# Patient Record
Sex: Male | Born: 1975 | Race: Black or African American | Hispanic: No | Smoking: Former smoker
Health system: Southern US, Community
[De-identification: ages and names within clinical notes are randomized; demographics above are authoritative.]

## PROBLEM LIST (undated history)

## (undated) DIAGNOSIS — I10 Essential (primary) hypertension: Secondary | ICD-10-CM

## (undated) DIAGNOSIS — R51 Headache: Secondary | ICD-10-CM

## (undated) DIAGNOSIS — M1990 Unspecified osteoarthritis, unspecified site: Secondary | ICD-10-CM

## (undated) DIAGNOSIS — R519 Headache, unspecified: Secondary | ICD-10-CM

## (undated) DIAGNOSIS — E669 Obesity, unspecified: Secondary | ICD-10-CM

## (undated) DIAGNOSIS — K219 Gastro-esophageal reflux disease without esophagitis: Secondary | ICD-10-CM

## (undated) DIAGNOSIS — T7840XA Allergy, unspecified, initial encounter: Secondary | ICD-10-CM

## (undated) HISTORY — DX: Obesity, unspecified: E66.9

## (undated) HISTORY — PX: ACHILLES TENDON REPAIR: SUR1153

## (undated) HISTORY — DX: Allergy, unspecified, initial encounter: T78.40XA

## (undated) HISTORY — PX: ANTERIOR CRUCIATE LIGAMENT REPAIR: SHX115

## (undated) HISTORY — DX: Essential (primary) hypertension: I10

## (undated) HISTORY — PX: TONSILLECTOMY: SUR1361

---

## 2014-08-15 ENCOUNTER — Emergency Department (HOSPITAL_BASED_OUTPATIENT_CLINIC_OR_DEPARTMENT_OTHER)
Admission: EM | Admit: 2014-08-15 | Discharge: 2014-08-15 | Disposition: A | Discharge status: Home / Self Care | Payer: Managed Care, Other (non HMO) | Attending: Emergency Medicine | Admitting: Emergency Medicine

## 2014-08-15 ENCOUNTER — Encounter (HOSPITAL_BASED_OUTPATIENT_CLINIC_OR_DEPARTMENT_OTHER): Payer: Self-pay | Admitting: *Deleted

## 2014-08-15 ENCOUNTER — Emergency Department (HOSPITAL_BASED_OUTPATIENT_CLINIC_OR_DEPARTMENT_OTHER): Payer: Managed Care, Other (non HMO)

## 2014-08-15 DIAGNOSIS — R109 Unspecified abdominal pain: Secondary | ICD-10-CM | POA: Insufficient documentation

## 2014-08-15 DIAGNOSIS — R0781 Pleurodynia: Secondary | ICD-10-CM

## 2014-08-15 DIAGNOSIS — Z720 Tobacco use: Secondary | ICD-10-CM | POA: Insufficient documentation

## 2014-08-15 LAB — URINALYSIS, ROUTINE W REFLEX MICROSCOPIC
Glucose, UA: NEGATIVE mg/dL
Hgb urine dipstick: NEGATIVE
KETONES UR: NEGATIVE mg/dL
LEUKOCYTES UA: NEGATIVE
Nitrite: NEGATIVE
PROTEIN: NEGATIVE mg/dL
Specific Gravity, Urine: 1.028 (ref 1.005–1.030)
UROBILINOGEN UA: 1 mg/dL (ref 0.0–1.0)
pH: 5.5 (ref 5.0–8.0)

## 2014-08-15 MED ORDER — OXYCODONE-ACETAMINOPHEN 5-325 MG PO TABS: 1.0000 | ORAL_TABLET | Freq: Four times a day (QID) | ORAL | Status: DC | PRN

## 2014-08-15 MED ORDER — OXYCODONE-ACETAMINOPHEN 5-325 MG PO TABS
2.0000 | ORAL_TABLET | Freq: Once | ORAL | Status: AC
  Administered 2014-08-15: 2 via ORAL
  Filled 2014-08-15: qty 2

## 2014-08-15 MED ORDER — CYCLOBENZAPRINE HCL 10 MG PO TABS: 10.0000 mg | ORAL_TABLET | Freq: Three times a day (TID) | ORAL | Status: DC | PRN

## 2014-08-15 NOTE — ED Notes (Signed)
Left flank/rib pain since Thursday-Denies cough; denies injury- pain with cough or laughing

## 2014-08-15 NOTE — ED Notes (Signed)
Urine spec obtained, to lab

## 2014-08-15 NOTE — ED Notes (Signed)
Pt complains of Right sided pain. Works in [employer] so pulling/lifting all the time. "I think I just pulled a muscle." Denies any GU/GI issues. Pain when coughing or with certain movement.

## 2014-08-15 NOTE — Discharge Instructions (Signed)
No driving or operating heavy machinery while taking flexeril. This medication may make you drowsy. Take percocet for severe pain only. No driving or operating heavy machinery while taking percocet. This medication may cause drowsiness. Avoid heavy lifting or hard physical activity for the next few days. Follow-up with one of the attached resources to establish care with a primary care physician.  Musculoskeletal Pain Musculoskeletal pain is muscle and boney aches and pains. These pains can occur in any part of the body. Your caregiver may treat you without knowing the cause of the pain. They may treat you if blood or urine tests, X-rays, and other tests were normal.  CAUSES There is often not a definite cause or reason for these pains. These pains may be caused by a type of germ (virus). The discomfort may also come from overuse. Overuse includes working out too hard when your body is not fit. Boney aches also come from weather changes. Bone is sensitive to atmospheric pressure changes. HOME CARE INSTRUCTIONS   Ask when your test results will be ready. Make sure you get your test results.  Only take over-the-counter or prescription medicines for pain, discomfort, or fever as directed by your caregiver. If you were given medications for your condition, do not drive, operate machinery or power tools, or sign legal documents for 24 hours. Do not drink alcohol. Do not take sleeping pills or other medications that may interfere with treatment.  Continue all activities unless the activities cause more pain. When the pain lessens, slowly resume normal activities. Gradually increase the intensity and duration of the activities or exercise.  During periods of severe pain, bed rest may be helpful. Lay or sit in any position that is comfortable.  Putting ice on the injured area.  Put ice in a bag.  Place a towel between your skin and the bag.  Leave the ice on for 15 to 20 minutes, 3 to 4 times a  day.  Follow up with your caregiver for continued problems and no reason can be found for the pain. If the pain becomes worse or does not go away, it may be necessary to repeat tests or do additional testing. Your caregiver may need to look further for a possible cause. SEEK IMMEDIATE MEDICAL CARE IF:  You have pain that is getting worse and is not relieved by medications.  You develop chest pain that is associated with shortness or breath, sweating, feeling sick to your stomach (nauseous), or throw up (vomit).  Your pain becomes localized to the abdomen.  You develop any new symptoms that seem different or that concern you. MAKE SURE YOU:   Understand these instructions.  Will watch your condition.  Will get help right away if you are not doing well or get worse. Document Released: 12/19/2004 Document Revised: 03/13/2011 Document Reviewed: 08/23/2012 [facility] Patient Information 2015 [place], [place]. This information is not intended to replace advice given to you by your health care provider. Make sure you discuss any questions you have with your health care provider.  Chest Wall Pain Chest wall pain is pain in or around the bones and muscles of your chest. It may take up to 6 weeks to get better. It may take longer if you must stay physically active in your work and activities.  CAUSES  Chest wall pain may happen on its own. However, it may be caused by:  A viral illness like the flu.  Injury.  Coughing.  Exercise.  Arthritis.  Fibromyalgia.  Shingles.  HOME CARE INSTRUCTIONS   Avoid overtiring physical activity. Try not to strain or perform activities that cause pain. This includes any activities using your chest or your abdominal and side muscles, especially if heavy weights are used.  Put ice on the sore area.  Put ice in a plastic bag.  Place a towel between your skin and the bag.  Leave the ice on for 15-20 minutes per hour while awake for the first 2  days.  Only take over-the-counter or prescription medicines for pain, discomfort, or fever as directed by your caregiver. SEEK IMMEDIATE MEDICAL CARE IF:   Your pain increases, or you are very uncomfortable.  You have a fever.  Your chest pain becomes worse.  You have new, unexplained symptoms.  You have nausea or vomiting.  You feel sweaty or lightheaded.  You have a cough with phlegm (sputum), or you cough up blood. MAKE SURE YOU:   Understand these instructions.  Will watch your condition.  Will get help right away if you are not doing well or get worse. Document Released: 12/19/2004 Document Revised: 03/13/2011 Document Reviewed: 08/15/2010 [facility] Patient Information 2015 [place], [place]. This information is not intended to replace advice given to you by your health care provider. Make sure you discuss any questions you have with your health care provider.

## 2014-08-15 NOTE — ED Provider Notes (Signed)
CSN: [id number]     Arrival date & time 08/15/14  1839 History   First MD Initiated Contact with Patient 08/15/14 2004     Chief Complaint  Patient presents with  . Flank Pain     (Consider location/radiation/quality/duration/timing/severity/associated sxs/prior Treatment) HPI Comments: 39 year old male presenting with left-sided flank/rib pain 3 days. No known injury or trauma but states he works in [employer] and is pulling and lifting things frequently. He believes he pulled a muscle. Pain worse with movement, laughing or deep inspiration. No alleviating factors tried. Denies fever, chills, chest pain, shortness of breath, abdominal pain, nausea, vomiting, urinary symptoms or back pain.  Patient is a 39 y.o. male presenting with flank pain. The history is provided by the patient.  Flank Pain    History reviewed. No pertinent past medical history. Past Surgical History  Procedure Laterality Date  . Anterior cruciate ligament repair    . Achilles tendon repair    . Tonsillectomy     No family history on file. Social History  Substance Use Topics  . Smoking status: Current Every Day Smoker    Types: Cigarettes  . Smokeless tobacco: Never Used  . Alcohol Use: 1.8 oz/week    3 Cans of beer per week     Comment: 3/day    Review of Systems  Genitourinary: Positive for flank pain.  All other systems reviewed and are negative.     Allergies  Review of patient's allergies indicates no known allergies.  Home Medications   Prior to Admission medications   Medication Sig Start Date End Date Taking? Authorizing Provider  cyclobenzaprine (FLEXERIL) 10 MG tablet Take 1 tablet (10 mg total) by mouth every 8 (eight) hours as needed for muscle spasms. 08/15/14   Kathrynn Speed, PA-C  oxyCODONE-acetaminophen (PERCOCET/ROXICET) 5-325 MG per tablet Take 1-2 tablets by mouth every 6 (six) hours as needed for severe pain. 08/15/14   Kirsta Probert M Wava Kildow, PA-C   BP 110/69 mmHg  Pulse 72   Temp(Src) 98.4 F (36.9 C) (Oral)  Resp 18  Ht 6' (1.829 m)  Wt 373 lb 11.2 oz (169.509 kg)  BMI 50.67 kg/m2  SpO2 100% Physical Exam  Constitutional: He is oriented to person, place, and time. He appears well-developed and well-nourished. No distress.  Morbidly obese.  HENT:  Head: Normocephalic and atraumatic.  Eyes: Conjunctivae and EOM are normal.  Neck: Normal range of motion. Neck supple.  Cardiovascular: Normal rate, regular rhythm and normal heart sounds.   Pulmonary/Chest: Effort normal and breath sounds normal.  TTP right anterolateral ribs. No edema or step-off. Exam somewhat limited by patient's body habitus.  Abdominal: Soft. Bowel sounds are normal.  Musculoskeletal: Normal range of motion. He exhibits no edema.  Neurological: He is alert and oriented to person, place, and time.  Skin: Skin is warm and dry.  Psychiatric: He has a normal mood and affect. His behavior is normal.  Nursing note and vitals reviewed.   ED Course  Procedures (including critical care time) Labs Review Labs Reviewed  URINALYSIS, ROUTINE W REFLEX MICROSCOPIC (NOT AT [facility]) - Abnormal; Notable for the following:    Bilirubin Urine SMALL (*)    All other components within normal limits    Imaging Review Dg Ribs Unilateral W/chest Left  08/15/2014   CLINICAL DATA:  Acute onset of left flank and rib pain. Initial encounter.  EXAM: LEFT RIBS AND CHEST - 3+ VIEW  COMPARISON:  None.  FINDINGS: No displaced rib fractures are seen.  The lungs are well-aerated. Pulmonary vascularity is at the upper limits of normal. There is no evidence of focal opacification, pleural effusion or pneumothorax.  The cardiomediastinal silhouette is borderline normal in size. No acute osseous abnormalities are seen.  IMPRESSION: No displaced rib fracture seen. No acute cardiopulmonary process identified.   Electronically Signed   By: Roanna Raider M.D.   On: 08/15/2014 21:13   I, Celene Skeen, personally reviewed and  evaluated these images and lab results as part of my medical decision-making.   EKG Interpretation None      MDM   Final diagnoses:  Rib pain on left side   Nontoxic appearing, NAD. AF VSS. Pain reproducible over left anterolateral ribs. No abdominal tenderness. No associated shortness of breath or chest pain. Works in [employer] and is constantly lifting things and moving things. X-ray negative for any fracture. Most likely musculoskeletal. Advised rest, ice/heat, NSAIDs along with will give Rx for Percocet and Flexeril. Resources given for PCP follow-up. Stable for discharge. Return precautions given. Patient states understanding of treatment care plan and is agreeable.  Kathrynn Speed, PA-C 08/15/14 2209  Blake Divine, MD 08/16/14 [phone]

## 2015-03-28 ENCOUNTER — Emergency Department (HOSPITAL_BASED_OUTPATIENT_CLINIC_OR_DEPARTMENT_OTHER)
Admission: EM | Admit: 2015-03-28 | Discharge: 2015-03-28 | Disposition: A | Discharge status: Home / Self Care | Payer: Managed Care, Other (non HMO) | Attending: Emergency Medicine | Admitting: Emergency Medicine

## 2015-03-28 ENCOUNTER — Encounter (HOSPITAL_BASED_OUTPATIENT_CLINIC_OR_DEPARTMENT_OTHER): Payer: Self-pay | Admitting: Emergency Medicine

## 2015-03-28 DIAGNOSIS — R200 Anesthesia of skin: Secondary | ICD-10-CM | POA: Diagnosis not present

## 2015-03-28 DIAGNOSIS — M62838 Other muscle spasm: Secondary | ICD-10-CM | POA: Diagnosis not present

## 2015-03-28 DIAGNOSIS — F17210 Nicotine dependence, cigarettes, uncomplicated: Secondary | ICD-10-CM | POA: Insufficient documentation

## 2015-03-28 DIAGNOSIS — H9319 Tinnitus, unspecified ear: Secondary | ICD-10-CM | POA: Insufficient documentation

## 2015-03-28 DIAGNOSIS — M542 Cervicalgia: Secondary | ICD-10-CM | POA: Diagnosis present

## 2015-03-28 DIAGNOSIS — H538 Other visual disturbances: Secondary | ICD-10-CM | POA: Diagnosis not present

## 2015-03-28 MED ORDER — DIAZEPAM 5 MG PO TABS: 5.0000 mg | ORAL_TABLET | Freq: Four times a day (QID) | ORAL | Status: DC | PRN

## 2015-03-28 MED ORDER — ACETAMINOPHEN 500 MG PO TABS
1000.0000 mg | ORAL_TABLET | Freq: Once | ORAL | Status: AC
  Administered 2015-03-28: 1000 mg via ORAL
  Filled 2015-03-28: qty 2

## 2015-03-28 MED ORDER — OXYCODONE HCL 5 MG PO TABS
5.0000 mg | ORAL_TABLET | Freq: Once | ORAL | Status: AC
  Administered 2015-03-28: 5 mg via ORAL
  Filled 2015-03-28: qty 1

## 2015-03-28 MED ORDER — DIAZEPAM 5 MG PO TABS
5.0000 mg | ORAL_TABLET | Freq: Once | ORAL | Status: AC
  Administered 2015-03-28: 5 mg via ORAL
  Filled 2015-03-28: qty 1

## 2015-03-28 MED ORDER — IBUPROFEN 800 MG PO TABS
800.0000 mg | ORAL_TABLET | Freq: Once | ORAL | Status: AC
  Administered 2015-03-28: 800 mg via ORAL
  Filled 2015-03-28: qty 1

## 2015-03-28 NOTE — ED Notes (Signed)
Pt with decreased field of vision to right upper lateral vision and right lateral lower vision, pt states vision in right eye seems blurred to him,

## 2015-03-28 NOTE — ED Provider Notes (Signed)
CSN: [id number]     Arrival date & time 03/28/15  1221 History   First MD Initiated Contact with Patient 03/28/15 1245     Chief Complaint  Patient presents with  . Neck Injury     (Consider location/radiation/quality/duration/timing/severity/associated sxs/prior Treatment) Patient is a 40 y.o. male presenting with neck injury. The history is provided by the patient.  Neck Injury This is a new problem. The current episode started less than 1 hour ago. The problem occurs constantly. The problem has not changed since onset.Pertinent negatives include no chest pain, no abdominal pain, no headaches and no shortness of breath. Nothing aggravates the symptoms. Nothing relieves the symptoms. He has tried nothing for the symptoms. The treatment provided no relief.   40 yo M With a chief complaint of right-sided neck pain. This happened suddenly couple days ago when he was rubbing his head. Had spontaneous pain to the right side of his neck. Pain is worse with movement or palpation. Also complaining of ringing in his ears and blurry vision in his right eye. Also has numbness to the palm of his right hand. He denies any other neuro symptoms. The symptoms are worse with worsening of the pain.  History reviewed. No pertinent past medical history. Past Surgical History  Procedure Laterality Date  . Anterior cruciate ligament repair    . Achilles tendon repair    . Tonsillectomy     History reviewed. No pertinent family history. Social History  Substance Use Topics  . Smoking status: Current Every Day Smoker    Types: Cigarettes  . Smokeless tobacco: Never Used  . Alcohol Use: 1.8 oz/week    3 Cans of beer per week     Comment: 3/day    Review of Systems  Constitutional: Negative for fever and chills.  HENT: Negative for congestion and facial swelling.   Eyes: Negative for discharge and visual disturbance.  Respiratory: Negative for shortness of breath.   Cardiovascular: Negative for chest  pain and palpitations.  Gastrointestinal: Negative for vomiting, abdominal pain and diarrhea.  Musculoskeletal: Positive for myalgias. Negative for arthralgias.  Skin: Negative for color change and rash.  Neurological: Negative for tremors, syncope and headaches.  Psychiatric/Behavioral: Negative for confusion and dysphoric mood.      Allergies  Review of patient's allergies indicates no known allergies.  Home Medications   Prior to Admission medications   Medication Sig Start Date End Date Taking? Authorizing Provider  cyclobenzaprine (FLEXERIL) 10 MG tablet Take 1 tablet (10 mg total) by mouth every 8 (eight) hours as needed for muscle spasms. 08/15/14   Robyn M Hess, PA-C  diazepam (VALIUM) 5 MG tablet Take 1 tablet (5 mg total) by mouth every 6 (six) hours as needed for muscle spasms (spasms). 03/28/15   Melene Plan, DO  oxyCODONE-acetaminophen (PERCOCET/ROXICET) 5-325 MG per tablet Take 1-2 tablets by mouth every 6 (six) hours as needed for severe pain. 08/15/14   Robyn M Hess, PA-C   BP 167/82 mmHg  Pulse 72  Temp(Src) 98.4 F (36.9 C) (Oral)  Resp 20  Ht 6' (1.829 m)  Wt 335 lb (151.955 kg)  BMI 45.42 kg/m2  SpO2 100% Physical Exam  Constitutional: He is oriented to person, place, and time. He appears well-developed and well-nourished.  HENT:  Head: Normocephalic and atraumatic.  No erythema or injection of the right eye. Pupils are 3+ reactive bilaterally. Extraocular motion intact. TMs normal.  Eyes: EOM are normal. Pupils are equal, round, and reactive to light.  Neck: Normal range of motion. Neck supple. No JVD present.  Cardiovascular: Normal rate and regular rhythm.  Exam reveals no gallop and no friction rub.   No murmur heard. Pulmonary/Chest: No respiratory distress. He has no wheezes.  Abdominal: He exhibits no distension. There is no tenderness. There is no rebound and no guarding.  Musculoskeletal: Normal range of motion. He exhibits tenderness (tenderness  palpation about the muscle belly of the trapezius that re-creates the symptoms. Pulse motor and sensation is intact distally.).  Neurological: He is alert and oriented to person, place, and time.  Skin: No rash noted. No pallor.  Psychiatric: He has a normal mood and affect. His behavior is normal.  Nursing note and vitals reviewed.   ED Course  Procedures (including critical care time) Labs Review Labs Reviewed - No data to display  Imaging Review No results found. I have personally reviewed and evaluated these images and lab results as part of my medical decision-making.   EKG Interpretation None      MDM   Final diagnoses:  Trapezius muscle spasm    40 yo M with a chief complaints of neck pain. Pain is localized into the belly of the trapezius. Pressure in that area re-creates his symptoms. Pulse motor and sensation is intact distally. Patient has no neuro deficits on my exam. Discussed with the patient that this is likely a trapezius spasm, however I'm unable to rule out a dissection of the vasculature of his neck without CT imaging. Patient is electing for conservative therapy with 3 days of rest as well as a sling. He understands the risk for stroke.   3:03 PM:  I have discussed the diagnosis/risks/treatment options with the patient and family and believe the pt to be eligible for discharge home to follow-up with PCP. We also discussed returning to the ED immediately if new or worsening sx occur. We discussed the sx which are most concerning (e.g., stroke like symptoms, fever) that necessitate immediate return. Medications administered to the patient during their visit and any new prescriptions provided to the patient are listed below.  Medications given during this visit Medications  acetaminophen (TYLENOL) tablet 1,000 mg (1,000 mg Oral Given 03/28/15 1319)  ibuprofen (ADVIL,MOTRIN) tablet 800 mg (800 mg Oral Given 03/28/15 1319)  oxyCODONE (Oxy IR/ROXICODONE) immediate  release tablet 5 mg (5 mg Oral Given 03/28/15 1319)  diazepam (VALIUM) tablet 5 mg (5 mg Oral Given 03/28/15 1320)    Discharge Medication List as of 03/28/2015  1:10 PM    START taking these medications   Details  diazepam (VALIUM) 5 MG tablet Take 1 tablet (5 mg total) by mouth every 6 (six) hours as needed for muscle spasms (spasms)., Starting 03/28/2015, Until Discontinued, Print        The patient appears reasonably screen and/or stabilized for discharge and I doubt any other medical condition or other [facility] requiring further screening, evaluation, or treatment in the ED at this time prior to discharge.     Melene Planan Heavan Francom, DO 03/28/15 1504

## 2015-03-28 NOTE — Discharge Instructions (Signed)
Take 4 over the counter ibuprofen tablets 3 times a day or 2 over-the-counter naproxen tablets twice a day for pain. ° °

## 2015-03-28 NOTE — ED Notes (Signed)
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

## 2015-03-30 ENCOUNTER — Telehealth: Payer: Self-pay

## 2015-03-30 NOTE — Telephone Encounter (Signed)
Left message for return call to update information in patients chart.

## 2015-03-30 NOTE — Telephone Encounter (Signed)
Pre Visit Call Completed. 

## 2015-03-31 ENCOUNTER — Encounter: Payer: Self-pay | Admitting: Medical

## 2015-03-31 ENCOUNTER — Encounter (HOSPITAL_BASED_OUTPATIENT_CLINIC_OR_DEPARTMENT_OTHER): Payer: Self-pay

## 2015-03-31 ENCOUNTER — Telehealth: Payer: Self-pay | Admitting: Medical

## 2015-03-31 ENCOUNTER — Ambulatory Visit (INDEPENDENT_AMBULATORY_CARE_PROVIDER_SITE_OTHER): Payer: Managed Care, Other (non HMO) | Admitting: Medical

## 2015-03-31 ENCOUNTER — Ambulatory Visit (HOSPITAL_BASED_OUTPATIENT_CLINIC_OR_DEPARTMENT_OTHER)
Admission: RE | Admit: 2015-03-31 | Discharge: 2015-03-31 | Disposition: A | Discharge status: Home / Self Care | Payer: Managed Care, Other (non HMO) | Source: Ambulatory Visit | Attending: Medical | Admitting: Medical

## 2015-03-31 VITALS — BP 124/90 | HR 71 | Temp 98.0°F | Resp 16 | Ht 71.5 in | Wt 384.4 lb

## 2015-03-31 DIAGNOSIS — R29898 Other symptoms and signs involving the musculoskeletal system: Secondary | ICD-10-CM

## 2015-03-31 DIAGNOSIS — M542 Cervicalgia: Secondary | ICD-10-CM | POA: Insufficient documentation

## 2015-03-31 DIAGNOSIS — H538 Other visual disturbances: Secondary | ICD-10-CM | POA: Diagnosis not present

## 2015-03-31 MED ORDER — IOPAMIDOL (ISOVUE-370) INJECTION 76%
100.0000 mL | Freq: Once | INTRAVENOUS | Status: AC | PRN
  Administered 2015-03-31: 100 mL via INTRAVENOUS

## 2015-03-31 MED ORDER — CYCLOBENZAPRINE HCL 10 MG PO TABS: 10.0000 mg | ORAL_TABLET | Freq: Every day | ORAL | Status: DC

## 2015-03-31 MED ORDER — HYDROCODONE-ACETAMINOPHEN 5-325 MG PO TABS: 1.0000 | ORAL_TABLET | Freq: Four times a day (QID) | ORAL | Status: DC | PRN

## 2015-03-31 MED ORDER — KETOROLAC TROMETHAMINE 60 MG/2ML IM SOLN
60.0000 mg | Freq: Once | INTRAMUSCULAR | Status: AC
  Administered 2015-03-31: 60 mg via INTRAMUSCULAR

## 2015-03-31 NOTE — Progress Notes (Signed)
Subjective:    Patient ID: Louis [name], male    DOB: [birth year], 40 y.o.   MRN: [id number]  HPI   I have reviewed pt PMH, PSH, FH, Social History and Surgical History  Pt works [employer], does some stretch and flex at work to prevent energy but no formal exercise, pt avoids fried foods and eats vegetables, married- 7 children.(2 in house)  Pt in with some neck pain since Friday morning. 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 He describes extreme neck pain 10/10. Pt went to the ED. And they evaluated him. Pt states on Sunday he had numbness of the rt hand. Some numbness to rt hand. Pt states rt hand pain got better since Sunday. Pt given valium rx. He was given 5 tablets. Pt is very tender to even light touch.  Pt on Friday morning with onset of severe pain neck pain and transient blurred vision.   Pt felt off balance Friday, Saturday and Sunday.   Pt has been off of work for 3 days.  Some sneezing and allergy flare just this past weekend. When sneezing neck pain is severe.  He states he will restart otc allergy meds.  No trauma to neck in past 2 wks. No mva. No work injury.  Pt smoke 1 pack every 2 wks.   Review of Systems  Constitutional: Negative for fever, chills and fatigue.  HENT: Positive for sneezing. Negative for congestion and dental problem.   Eyes:       Occasional rt eye blurred vision since neck pain onset. But better than on first day.  Respiratory: Negative for chest tightness, shortness of breath and wheezing.   Cardiovascular: Negative for chest pain and palpitations.  Gastrointestinal: Negative for nausea, abdominal pain and diarrhea.  Musculoskeletal: Positive for neck pain.  Skin: Negative for rash.  Neurological: Negative for dizziness and headaches.       Some rt hand feeling week. Some pain that shot down his rt arm initiall with onset of pain.  Hematological: Negative for adenopathy. Does not bruise/bleed easily.    Psychiatric/Behavioral: Negative for behavioral problems and confusion.     Past Medical History  Diagnosis Date  . Allergy     Social History   Social History  . Marital Status: Married    Spouse Name: N/A  . Number of Children: N/A  . Years of Education: N/A   Occupational History  . Not on file.   Social History Main Topics  . Smoking status: Current Every Day Smoker    Types: Cigarettes  . Smokeless tobacco: Never Used  . 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  . Drug Use: No  . Sexual Activity: Yes   Other Topics Concern  . Not on file   Social History Narrative    Past Surgical History  Procedure Laterality Date  . Anterior cruciate ligament repair    . Achilles tendon repair    . Tonsillectomy      Family History  Problem Relation Age of Onset  . Diabetes Father     No Known Allergies  Current Outpatient Prescriptions on File Prior to Visit  Medication Sig Dispense Refill  . diazepam (VALIUM) 5 MG tablet Take 1 tablet (5 mg total) by mouth every 6 (six) hours as needed for muscle spasms (spasms). 5 tablet 0  . Naproxen-Esomeprazole (VIMOVO) 500-20  MG TBEC Take by mouth.     No current facility-administered medications on file prior to visit.    BP 124/90 mmHg  Pulse 71  Temp(Src) 98 F (36.7 C) (Oral)  Resp 16  Ht 5' 11.5" (1.816 m)  Wt 384 lb 6.4 oz (174.363 kg)  BMI 52.87 kg/m2  SpO2 98%       Objective:   Physical Exam  General Mental Status- Alert. General Appearance- Not in acute distress.   Skin General: Color- Normal Color. Moisture- Normal Moisture.  Neck Carotid Arteries- Normal color. Moisture- Normal Moisture. No carotid bruits. No JVD. Posterior neck all over very tender to palpation. All over. Mid c-spine tedner.   Chest and Lung Exam Auscultation: Breath Sounds:-Normal.  Cardiovascular Auscultation:Rythm- Regular. Murmurs & Other Heart  Sounds:Auscultation of the heart reveals- No Murmurs.  Abdomen Inspection:-Inspeection Normal. Palpation/Percussion:Note:No mass. Palpation and Percussion of the abdomen reveal- Non Tender, Non Distended + BS, no rebound or guarding.   Neurologic Cranial Nerve exam:- CN III-XII intact(No nystagmus), symmetric smile.   Rt upper ext 3/5 strength flexion.Rt grip strength 3/5. Lt upper ext 5/5 and grip strength 5/5.     Assessment & Plan:  For your neck pain we gave toradol 60 mg im today.  Rx cyclobenzaprine to relax muscles use at night. Stop valium  Contiue vimovo.   Rx norco for pain. Not use while driving or operating heavy machinery. Side effects reviewed  Work excuse until Wed next week. Need to evaluate you on Tuesday  If you have worse or changing signs or symptoms then ED evaluation.  We are trying to get ct of your neck stat today. Wait in radiology until staff notifies you if authorizationgoes through.  Discussed with radiologist today on test to order.

## 2015-03-31 NOTE — Telephone Encounter (Signed)
I did advise pt on his ct of neck findings. His pain did subside some with toradol. He will folllow up on on Tuesday.

## 2015-03-31 NOTE — Patient Instructions (Addendum)
For your neck pain we gave toradol 60 mg im today.  Rx cyclobenzaprine to relax muscles use at night. Stop valium  Contiue vimovo.   Rx norco for pain. Not use while driving or operating heavy machinery. Side effects reviewed  Work excuse until Wed  next week. Need to evaluate you on Tuesday  If you have worse or changing signs or symptoms then ED evaluation.  We are trying to get ct of your neck stat today. Wait in radiology  until staff notifies you if authorization goes through.

## 2015-03-31 NOTE — Progress Notes (Signed)
Pre visit review using our clinic review tool, if applicable. No additional management support is needed unless otherwise documented below in the visit note. 

## 2015-04-06 ENCOUNTER — Ambulatory Visit (INDEPENDENT_AMBULATORY_CARE_PROVIDER_SITE_OTHER): Payer: Managed Care, Other (non HMO) | Admitting: Medical

## 2015-04-06 ENCOUNTER — Encounter: Payer: Self-pay | Admitting: Medical

## 2015-04-06 VITALS — BP 124/82 | HR 78 | Temp 97.4°F | Ht 71.5 in | Wt 384.0 lb

## 2015-04-06 DIAGNOSIS — M542 Cervicalgia: Secondary | ICD-10-CM | POA: Diagnosis not present

## 2015-04-06 DIAGNOSIS — R29898 Other symptoms and signs involving the musculoskeletal system: Secondary | ICD-10-CM

## 2015-04-06 NOTE — Progress Notes (Signed)
Subjective:    Patient ID: Louis [name], male    DOB: 04-10-1975, 40 y.o.   MRN: [id number]  HPI  Pt states he is no longer in severe pain. He does note that feels or hears crepitus type sound at times. When he sneezes or cough feels  Faint pressure in neck and upper back. But he is not feeling any radicular pain anymore. No pain radiating to rt arm. Sitting today no pain. Pt was in some pain yesterday. Pt states he doe not like to take medicine and he has strong threshold pain.    Review of Systems  Constitutional: Negative for fever, chills and fatigue.  Respiratory: Negative for cough, choking, shortness of breath and wheezing.   Cardiovascular: Negative for chest pain and palpitations.  Gastrointestinal: Negative for abdominal pain.  Musculoskeletal: Positive for neck pain. Negative for myalgias, back pain and neck stiffness.  Skin: Negative for rash.  Neurological: Negative for dizziness, tremors, speech difficulty, numbness and headaches.  Hematological: Negative for adenopathy. Does not bruise/bleed easily.  Psychiatric/Behavioral: Negative for behavioral problems and confusion.    Past Medical History  Diagnosis Date  . Allergy     Social History   Social History  . Marital Status: Married    Spouse Name: N/A  . Number of Children: N/A  . Years of Education: N/A   Occupational History  . Not on file.   Social History Main Topics  . Smoking status: Current Every Day Smoker    Types: Cigarettes  . Smokeless tobacco: Never Used  . 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  . Drug Use: No  . Sexual Activity: Yes   Other Topics Concern  . Not on file   Social History Narrative    Past Surgical History  Procedure Laterality Date  . Anterior cruciate ligament repair    . Achilles tendon repair    . Tonsillectomy      Family History  Problem Relation Age of Onset  . Diabetes Father     No  Known Allergies  Current Outpatient Prescriptions on File Prior to Visit  Medication Sig Dispense Refill  . cyclobenzaprine (FLEXERIL) 10 MG tablet Take 1 tablet (10 mg total) by mouth at bedtime. 14 tablet 0  . HYDROcodone-acetaminophen (NORCO) 5-325 MG tablet Take 1 tablet by mouth every 6 (six) hours as needed for moderate pain. 30 tablet 0  . Naproxen-Esomeprazole (VIMOVO) 500-20 MG TBEC Take by mouth.     No current facility-administered medications on file prior to visit.    BP 124/82 mmHg  Pulse 78  Temp(Src) 97.4 F (36.3 C) (Oral)  Ht 5' 11.5" (1.816 m)  Wt 384 lb (174.181 kg)  BMI 52.82 kg/m2  SpO2 98%    Past Medical History  Diagnosis Date  . Allergy     Social History   Social History  . Marital Status: Married    Spouse Name: N/A  . Number of Children: N/A  . Years of Education: N/A   Occupational History  . Not on file.   Social History Main Topics  . Smoking status: Current Every Day Smoker    Types: Cigarettes  . Smokeless tobacco: Never Used  . 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  . Drug Use: No  .  Sexual Activity: Yes   Other Topics Concern  . Not on file   Social History Narrative    Past Surgical History  Procedure Laterality Date  . Anterior cruciate ligament repair    . Achilles tendon repair    . Tonsillectomy      Family History  Problem Relation Age of Onset  . Diabetes Father     No Known Allergies  Current Outpatient Prescriptions on File Prior to Visit  Medication Sig Dispense Refill  . cyclobenzaprine (FLEXERIL) 10 MG tablet Take 1 tablet (10 mg total) by mouth at bedtime. 14 tablet 0  . HYDROcodone-acetaminophen (NORCO) 5-325 MG tablet Take 1 tablet by mouth every 6 (six) hours as needed for moderate pain. 30 tablet 0  . Naproxen-Esomeprazole (VIMOVO) 500-20 MG TBEC Take by mouth.     No current facility-administered medications on file prior to  visit.    BP 124/82 mmHg  Pulse 78  Temp(Src) 97.4 F (36.3 C) (Oral)  Ht 5' 11.5" (1.816 m)  Wt 384 lb (174.181 kg)  BMI 52.82 kg/m2  SpO2 98%        Objective:   Physical Exam  Physical Exam  General Mental Status- Alert. General Appearance- Not in acute distress. No pain sitting today on exam.  Skin General: Color- Normal Color. Moisture- Normal Moisture.  Neck Carotid Arteries- Normal color. Moisture- Normal Moisture. No carotid bruits. No JVD. Rt side neck faint trapezius tender. No mid spine tender.  Chest and Lung Exam Auscultation: Breath Sounds:-Normal.  Cardiovascular Auscultation:Rythm- Regular. Murmurs & Other Heart Sounds:Auscultation of the heart reveals- No Murmurs.  Abdomen Inspection:-Inspeection Normal. Palpation/Percussion:Note:No mass. Palpation and Percussion of the abdomen reveal- Non Tender, Non Distended + BS, no rebound or guarding.   Neurologic Cranial Nerve exam:- CN III-XII intact(No nystagmus), symmetric smile.  Bilateral upper ext- 5/5 upper ext strength symmetric      Assessment & Plan:  You are much better now. I would do light work lifting max 5-10 pounds at work for about 2 wks. Then advance to regular duty when you feel no pain at all. If you do have recurrent  radicular pain down either arm or any weakness let us know. Based on your recent history would add that point order mri of the cspine.  Continue vimovo, flexeril and norco if needed.  Follow up in 2 weeks or as needed.

## 2015-04-06 NOTE — Progress Notes (Signed)
Pre visit review using our clinic review tool, if applicable. No additional management support is needed unless otherwise documented below in the visit note. 

## 2015-04-06 NOTE — Patient Instructions (Addendum)
You are much better now. I would do light work lifting max 5-10 pounds at work for about 2 wks. Then advance to regular duty when you feel no pain at all. If you do have recurrent  radicular pain down either arm or any weakness let us know. Based on your recent history would add that point order mri of the cspine.  Continue vimovo, flexeril and norco if needed.  Follow up in 2 weeks or as needed.

## 2015-04-30 ENCOUNTER — Ambulatory Visit (INDEPENDENT_AMBULATORY_CARE_PROVIDER_SITE_OTHER): Payer: Managed Care, Other (non HMO) | Admitting: Medical

## 2015-04-30 ENCOUNTER — Encounter: Payer: Self-pay | Admitting: Medical

## 2015-04-30 ENCOUNTER — Ambulatory Visit (HOSPITAL_BASED_OUTPATIENT_CLINIC_OR_DEPARTMENT_OTHER)
Admission: RE | Admit: 2015-04-30 | Discharge: 2015-04-30 | Disposition: A | Discharge status: Home / Self Care | Payer: Managed Care, Other (non HMO) | Source: Ambulatory Visit | Attending: Medical | Admitting: Medical

## 2015-04-30 VITALS — BP 120/80 | HR 87 | Temp 98.1°F | Ht 72.0 in | Wt 381.4 lb

## 2015-04-30 DIAGNOSIS — M542 Cervicalgia: Secondary | ICD-10-CM | POA: Diagnosis not present

## 2015-04-30 DIAGNOSIS — M79622 Pain in left upper arm: Secondary | ICD-10-CM

## 2015-04-30 DIAGNOSIS — M545 Low back pain, unspecified: Secondary | ICD-10-CM

## 2015-04-30 DIAGNOSIS — M25522 Pain in left elbow: Secondary | ICD-10-CM

## 2015-04-30 DIAGNOSIS — M47896 Other spondylosis, lumbar region: Secondary | ICD-10-CM | POA: Diagnosis not present

## 2015-04-30 DIAGNOSIS — M792 Neuralgia and neuritis, unspecified: Secondary | ICD-10-CM | POA: Diagnosis not present

## 2015-04-30 DIAGNOSIS — M47892 Other spondylosis, cervical region: Secondary | ICD-10-CM | POA: Insufficient documentation

## 2015-04-30 MED ORDER — DICLOFENAC SODIUM 75 MG PO TBEC: 75.0000 mg | DELAYED_RELEASE_TABLET | Freq: Two times a day (BID) | ORAL | Status: DC

## 2015-04-30 MED ORDER — OMEPRAZOLE 20 MG PO CPDR: 20.0000 mg | DELAYED_RELEASE_CAPSULE | Freq: Every day | ORAL | Status: DC

## 2015-04-30 MED ORDER — CYCLOBENZAPRINE HCL 10 MG PO TABS: 10.0000 mg | ORAL_TABLET | Freq: Three times a day (TID) | ORAL | Status: DC | PRN

## 2015-04-30 NOTE — Patient Instructions (Signed)
For your neck and back pain will get cpsine and lumbar xray. Would you get specifics on the metal used for achilles repair. Model number/manufacturer specifics. We may need to do mri near future and would like to give info to radiology if have to order mri.  Rx diclofenac for pain. Flexeril to relax muscles after work.  Get left upper ext US today. MA will advise you on the time.   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 Let us know if you begin to associate sensation with lower back pain. Also recommend if you can not wear 60 lb work belt this may help some. Also weight loss may help.  Follow up in 2 wks or as needed

## 2015-04-30 NOTE — Progress Notes (Signed)
Pre visit review using our clinic review tool, if applicable. No additional management support is needed unless otherwise documented below in the visit note. 

## 2015-04-30 NOTE — Progress Notes (Signed)
Subjective:    Patient ID: Louis [name], male    DOB: 1975/05/11, 40 y.o.   MRN: [id number]  HPI  Pt in states he is doing ok. He has been taking it easy at work. But occasionally states if he takes a step about 6 inches will feel pain. Like compression type pain from neck to mid lspine area. Wife states that after work he appears uncomfortable in pain. Pt does not report any radiating pain to arms.   Lt arm cramped on Tuesday briefly but none since.  Medial aspect pain. No cramping pain now.   Left thigh lateral aspect occasional burning. Rare and transient pain. About 3 times a week over a month. Pain in left thigh not associated with lumbar pain.  Review of Systems  Constitutional: Negative for fever, chills and fatigue.  Respiratory: Negative for cough, chest tightness, shortness of breath and wheezing.   Cardiovascular: Negative for chest pain and palpitations.  Gastrointestinal: Negative for abdominal pain.  Musculoskeletal: Positive for back pain and neck pain.       Left thigh nerve type pain.   Skin: Negative for rash.  Neurological: Negative for dizziness and headaches.  Hematological: Negative for adenopathy. Does not bruise/bleed easily.  Psychiatric/Behavioral: Negative for behavioral problems and confusion.    Past Medical History  Diagnosis Date  . Allergy      Social History   Social History  . Marital Status: Married    Spouse Name: N/A  . Number of Children: N/A  . Years of Education: N/A   Occupational History  . Not on file.   Social History Main Topics  . Smoking status: Current Every Day Smoker    Types: Cigarettes  . Smokeless tobacco: Never Used  . 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  . Drug Use: No  . Sexual Activity: Yes   Other Topics Concern  . Not on file   Social History Narrative    Past Surgical History  Procedure Laterality Date  . Anterior cruciate ligament  repair    . Achilles tendon repair    . Tonsillectomy      Family History  Problem Relation Age of Onset  . Diabetes Father     No Known Allergies  Current Outpatient Prescriptions on File Prior to Visit  Medication Sig Dispense Refill  . cyclobenzaprine (FLEXERIL) 10 MG tablet Take 1 tablet (10 mg total) by mouth at bedtime. 14 tablet 0  . HYDROcodone-acetaminophen (NORCO) 5-325 MG tablet Take 1 tablet by mouth every 6 (six) hours as needed for moderate pain. 30 tablet 0  . Naproxen-Esomeprazole (VIMOVO) 500-20 MG TBEC Take by mouth.     No current facility-administered medications on file prior to visit.    BP 120/80 mmHg  Pulse 87  Temp(Src) 98.1 F (36.7 C) (Oral)  Ht 6' (1.829 m)  Wt 381 lb 6.4 oz (173.002 kg)  BMI 51.72 kg/m2       Objective:   Physical Exam  General Mental Status- Alert. General Appearance- Not in acute distress.   Skin General: Color- Normal Color. Moisture- Normal Moisture.  Neck Carotid Arteries- Normal color. Moisture- Normal Moisture. No carotid bruits. No JVD. When press lower cspine area feels pain in lumbar area.  Chest and Lung Exam Auscultation: Breath Sounds:-Normal.  Cardiovascular Auscultation:Rythm- Regular. Murmurs & Other Heart Sounds:Auscultation of the heart reveals- No Murmurs.  Abdomen  Inspection:-Inspeection Normal. Palpation/Percussion:Note:No mass. Palpation and Percussion of the abdomen reveal- Non Tender, Non Distended + BS, no rebound or guarding.    Neurologic Cranial Nerve exam:- CN III-XII intact(No nystagmus), symmetric smile. Strength:- 5/5 equal and symmetric strength both upper and lower extremities.   Back Mid lumbar spine tenderness to palpation. No pain on straight leg lift. No pain  on lateral movements and flexion/extension of the spine.  Lower ext neurologic  L5-S1 sensation intact bilaterally. Normal patellar reflexes bilaterally. No foot drop bilaterally.      Assessment &  Plan:  For your neck and back pain will get cpsine and lumbar xray. Would you get specifics on the metal used for achilles repair. Model number/manufacturer specifics. We may need to do mri near future and would like to give info to radiology if have to order mri.  Rx diclofenac for pain. Flexeril to relax muscles after work.  Get left upper ext US today. MA will advise you on the time.   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 Let us know if you begin to associate sensation with lower back pain. Also recommend if you can not wear 60 lb work belt this may help some. Also weight loss may help.  Follow up in 2 wks or as needed  Kin Galbraith, Ramon DredgeEdward, VF CorporationPA-C

## 2015-05-02 ENCOUNTER — Telehealth: Payer: Self-pay | Admitting: Medical

## 2015-05-02 DIAGNOSIS — M542 Cervicalgia: Secondary | ICD-10-CM

## 2015-05-02 NOTE — Telephone Encounter (Signed)
Referral to orthopedic/spine specialist put in. Let me know if they accept referral.

## 2015-05-14 ENCOUNTER — Encounter: Payer: Self-pay | Admitting: Medical

## 2015-05-14 ENCOUNTER — Ambulatory Visit (INDEPENDENT_AMBULATORY_CARE_PROVIDER_SITE_OTHER): Payer: Managed Care, Other (non HMO) | Admitting: Medical

## 2015-05-14 ENCOUNTER — Ambulatory Visit (HOSPITAL_BASED_OUTPATIENT_CLINIC_OR_DEPARTMENT_OTHER)
Admission: RE | Admit: 2015-05-14 | Discharge: 2015-05-14 | Disposition: A | Discharge status: Home / Self Care | Payer: Managed Care, Other (non HMO) | Source: Ambulatory Visit | Attending: Medical | Admitting: Medical

## 2015-05-14 ENCOUNTER — Other Ambulatory Visit: Payer: Self-pay | Admitting: Medical

## 2015-05-14 VITALS — BP 120/80 | HR 75 | Temp 98.0°F | Ht 71.5 in | Wt 386.4 lb

## 2015-05-14 DIAGNOSIS — M25552 Pain in left hip: Secondary | ICD-10-CM

## 2015-05-14 DIAGNOSIS — M542 Cervicalgia: Secondary | ICD-10-CM | POA: Diagnosis not present

## 2015-05-14 NOTE — Progress Notes (Signed)
Subjective:    Patient ID: Louis RiggersAvery [name], male    DOB: [birth year], 40 y.o.   MRN: [id number]  HPI   Pt in stating his level of neck pain is about the same. No new features.  Pt is going to see spine specialist today at 11:45 am.  Pt did have cervical spine xray done last visit.  He did not find out specifics of metal used in achilles tendon repair.   Pt states his previous left  left thigh mild pain is less. Mild burning pain on and off(some better since last visit). Pt is trying not wear his 60 lb belt. 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 No  Xray done on that visit date.    Review of Systems  Constitutional: Negative for fever, chills and fatigue.  Respiratory: Negative for cough, chest tightness, shortness of breath and wheezing.   Cardiovascular: Negative for chest pain and palpitations.  Musculoskeletal: Positive for neck pain.       Lt thigh pain lateral aspect burning pain at time. No skin rash.  Neurological: Negative for dizziness and headaches.  Hematological: Negative for adenopathy. Does not bruise/bleed easily.  Psychiatric/Behavioral: Negative for behavioral problems and confusion.    Past Medical History  Diagnosis Date  . Allergy      Social History   Social History  . Marital Status: Married    Spouse Name: N/A  . Number of Children: N/A  . Years of Education: N/A   Occupational History  . Not on file.   Social History Main Topics  . Smoking status: Current Every Day Smoker    Types: Cigarettes  . Smokeless tobacco: Never Used  . 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  . Drug Use: No  . Sexual Activity: Yes   Other Topics Concern  . Not on file   Social History Narrative    Past Surgical History  Procedure Laterality Date  . Anterior cruciate ligament repair    . Achilles tendon repair    . Tonsillectomy      Family History  Problem  Relation Age of Onset  . Diabetes Father     No Known Allergies  Current Outpatient Prescriptions on File Prior to Visit  Medication Sig Dispense Refill  . cyclobenzaprine (FLEXERIL) 10 MG tablet Take 1 tablet (10 mg total) by mouth 3 (three) times daily as needed for muscle spasms. 30 tablet 0  . diclofenac (VOLTAREN) 75 MG EC tablet Take 1 tablet (75 mg total) by mouth 2 (two) times daily. 30 tablet 0  . omeprazole (PRILOSEC) 20 MG capsule Take 1 capsule (20 mg total) by mouth daily. 30 capsule 3   No current facility-administered medications on file prior to visit.    BP 120/80 mmHg  Pulse 75  Temp(Src) 98 F (36.7 C) (Oral)  Ht 5' 11.5" (1.816 m)  Wt 386 lb 6.4 oz (175.27 kg)  BMI 53.15 kg/m2  SpO2 97%       Objective:   Physical Exam   General Mental Status- Alert. General Appearance- Not in acute distress.   Skin General: Color- Normal Color. Moisture- Normal Moisture.  Neck Carotid Arteries- Normal color. Moisture- Normal Moisture. No carotid bruits. No JVD. When press lower cspine area feels pain in lumbar area.  Chest and Lung Exam Auscultation: Breath Sounds:-Normal.  Cardiovascular Auscultation:Rythm- Regular. Murmurs & Other  Heart Sounds:Auscultation of the heart reveals- No Murmurs.  Abdomen Inspection:-Inspeection Normal. Palpation/Percussion:Note:No mass. Palpation and Percussion of the abdomen reveal- Non Tender, Non Distended + BS, no rebound or guarding.  Neurologic Cranial Nerve exam:- CN III-XII intact(No nystagmus), symmetric smile. Strength:- 5/5 equal and symmetric strength both upper and lower extremities.   Back Mid lumbar spine tenderness to palpation. No pain on straight leg lift. No pain on lateral movements and flexion/extension of the spine.  Lower ext neurologic  L5-S1 sensation intact bilaterally. Normal patellar reflexes bilaterally. No foot drop bilaterally.  Lt thigh- mild lateral aspect tender and mid thigh.(pt  states no hip pain)       Assessment & Plan:  You will attend spine specialist appointment and get opinion from them regarding neck pain. They may order mri.  For left thigh discomfort will get xray of femur today. 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  Continue to use diclofenac and flexeril.  Follow date to be determined depending on what specialist finds.  Andru Genter, Ramon Dredge, PA-C

## 2015-05-14 NOTE — Progress Notes (Signed)
Pre visit review using our clinic review tool, if applicable. No additional management support is needed unless otherwise documented below in the visit note. 

## 2015-05-14 NOTE — Patient Instructions (Signed)
You will attend spine specialist appointment and get opinion from them regarding neck pain. They may order mri.  For left thigh discomfort will get xray of femur today. 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  Continue to use diclofenac and flexeril.  Follow date to be determined depending on what specialist finds.

## 2015-05-20 ENCOUNTER — Other Ambulatory Visit: Payer: Self-pay | Admitting: Medical

## 2015-06-18 ENCOUNTER — Other Ambulatory Visit: Payer: Self-pay | Admitting: Medical

## 2015-07-14 ENCOUNTER — Other Ambulatory Visit: Payer: Self-pay

## 2015-07-14 MED ORDER — CYCLOBENZAPRINE HCL 10 MG PO TABS: 10.0000 mg | ORAL_TABLET | Freq: Three times a day (TID) | ORAL | Status: DC | PRN

## 2015-07-14 MED ORDER — DICLOFENAC SODIUM 75 MG PO TBEC: DELAYED_RELEASE_TABLET | ORAL | Status: DC

## 2015-07-14 NOTE — Telephone Encounter (Signed)
Rx filled for Diclofenac 75 mg and Flexeril 10 mg and sent to [facility] on [address]. I also informed the pt that they would be ready late this evening or tomorrow morning. Pt did not have any further questions.

## 2015-09-09 ENCOUNTER — Other Ambulatory Visit: Payer: Self-pay | Admitting: Medical

## 2015-09-09 NOTE — Telephone Encounter (Signed)
Last seen 05/14/15 and filled 07/14/15  Flexeril #90 with 1 Diclofenac #60 with 1   Please advise    KP

## 2015-09-09 NOTE — Telephone Encounter (Signed)
Called pharmacy and there is no rx at flexeril 90 tabs to be filled. She confirmed that.

## 2015-09-09 NOTE — Telephone Encounter (Signed)
Please call pt and ask him to follow up some time next week to see how his neck is. Have not seen him for some time.

## 2015-09-09 NOTE — Telephone Encounter (Signed)
I refilled pt limited number of flexeril today and diclofenac.   I saw refill request stated 90 tabs flexeril tid. With one refill.   I never write it like this. . This is not on our refill protocol policy.(muscle relaxants).   I thinks they should not be. I am very cautious with muscle relaxant prescriptions. Most of time just advise use at night.   Will you look into this.

## 2015-09-21 NOTE — Telephone Encounter (Signed)
Left message for patient to return call regarding appointment to follow up on neck.

## 2015-10-28 ENCOUNTER — Ambulatory Visit (INDEPENDENT_AMBULATORY_CARE_PROVIDER_SITE_OTHER): Payer: Managed Care, Other (non HMO) | Admitting: Medical

## 2015-10-28 ENCOUNTER — Other Ambulatory Visit: Payer: Self-pay

## 2015-10-28 ENCOUNTER — Encounter: Payer: Self-pay | Admitting: Medical

## 2015-10-28 VITALS — BP 130/80 | HR 88 | Temp 98.3°F | Ht 72.0 in | Wt 387.8 lb

## 2015-10-28 DIAGNOSIS — M542 Cervicalgia: Secondary | ICD-10-CM | POA: Diagnosis not present

## 2015-10-28 DIAGNOSIS — M5410 Radiculopathy, site unspecified: Secondary | ICD-10-CM

## 2015-10-28 MED ORDER — OMEPRAZOLE 20 MG PO CPDR: 20.0000 mg | DELAYED_RELEASE_CAPSULE | Freq: Every day | ORAL | 3 refills | Status: DC

## 2015-10-28 MED ORDER — DICLOFENAC SODIUM 75 MG PO TBEC: 75.0000 mg | DELAYED_RELEASE_TABLET | Freq: Two times a day (BID) | ORAL | 0 refills | Status: DC

## 2015-10-28 MED ORDER — TRAMADOL HCL 50 MG PO TABS: 50.0000 mg | ORAL_TABLET | Freq: Two times a day (BID) | ORAL | 0 refills | Status: DC

## 2015-10-28 MED ORDER — CYCLOBENZAPRINE HCL 10 MG PO TABS: ORAL_TABLET | ORAL | 0 refills | Status: DC

## 2015-10-28 MED ORDER — CYCLOBENZAPRINE HCL 10 MG PO TABS: 10.0000 mg | ORAL_TABLET | Freq: Every day | ORAL | 0 refills | Status: DC

## 2015-10-28 NOTE — Progress Notes (Signed)
Pre visit review using our clinic tool,if applicable. No additional management support is needed unless otherwise documented below in the visit note.  

## 2015-10-28 NOTE — Progress Notes (Signed)
Subjective:    Patient ID: Louis [name], male    DOB: 03-24-1975, 40 y.o.   MRN: [id number]  HPI  Pt in states still has  neck pain. He states he went to spine specialist appointmenst. Pt states first MD stated he needed surgery. Pt was hesitant to do surgery. Pt wanted a second opinion. Pt states after work up 2nd doctor told him he needed fusion of c1-c2.   Pt very hesitant to go through with the surgery.   Pt states he was hesitant due to reported limited range of motion will be result of surgery. Pt state specialist told him his neck looked like he had a fracture years ago. Pt states that his c3-c4 vertebrae already looked fused.   Pt still having numbness and tingling of his hands. Mostly on his left hand.  Pt states pt has moderate-severe pain at night.   Pt has been out of his tramadol, flexeril, and diclofenac. He does not drive on med. He also does not operate heavy machinery while med is in his system.  Note I reviewed mri report and appears pt may have misunderstood specialist description of his problem.   Review of Systems  Constitutional: Negative for chills, fatigue and fever.  Respiratory: Negative for cough, choking, shortness of breath and wheezing.   Cardiovascular: Negative for chest pain and palpitations.  Gastrointestinal: Negative for abdominal distention, abdominal pain, blood in stool and constipation.  Musculoskeletal: Positive for neck pain.  Skin: Negative for rash.  Neurological:       Some radicular pain to his arms.   Hematological: Negative for adenopathy. Does not bruise/bleed easily.  Psychiatric/Behavioral: Negative for behavioral problems and confusion.    Past Medical History:  Diagnosis Date  . Allergy      Social History   Social History  . Marital status: Married    Spouse name: N/A  . Number of children: N/A  . Years of education: N/A   Occupational History  . Not on file.   Social History Main Topics  . Smoking status:  Current Every Day Smoker    Types: Cigarettes  . Smokeless tobacco: Never Used  . 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  . Drug use: No  . Sexual activity: Yes   Other Topics Concern  . Not on file   Social History Narrative  . No narrative on file    Past Surgical History:  Procedure Laterality Date  . ACHILLES TENDON REPAIR    . ANTERIOR CRUCIATE LIGAMENT REPAIR    . TONSILLECTOMY      Family History  Problem Relation Age of Onset  . Diabetes Father     No Known Allergies  No current outpatient prescriptions on file prior to visit.   No current facility-administered medications on file prior to visit.     BP 121/70   Pulse 88   Temp 98.3 F (36.8 C) (Oral)   Ht 6' (1.829 m)   Wt (!) 387 lb 12.8 oz (175.9 kg)   SpO2 97%   BMI 52.60 kg/m       Objective:   Physical Exam  General Mental Status- Alert. General Appearance- Not in acute distress.   Skin General: Color- Normal Color. Moisture- Normal Moisture.  Neck Carotid Arteries- Normal color. Moisture- Normal Moisture. No carotid bruits. No JVD. Mild-moderate tenderness to palpation mid c spine.  Chest and Lung Exam Auscultation: Breath Sounds:-Normal.  Cardiovascular Auscultation:Rythm- Regular. Murmurs & Other Heart Sounds:Auscultation of the heart reveals- No Murmurs.   Neurologic Cranial Nerve exam:- CN III-XII intact(No nystagmus), symmetric smile. Strength:- 5/5 equal and symmetric strength both upper and lower extremities.      Assessment & Plan:  For your neck pain and radicular pain will refill your tramadol, flexeril and diclofenac.  I want you to sign release form and I want to get office noted from your both specialist. Then after review may get 3rd opinion.  Pt will sign contract for controlled meds and give UDS today.  Follow up in 2 weeks or as needed  Note did review mri of patient today. Tried to explain  the finding to him to give him better understanding.   Arianna Haydon, Ramon Dredge, PA-C

## 2015-10-28 NOTE — Patient Instructions (Addendum)
For your neck pain and radicular pain will refill your tramadol, flexeril and diclofenac.  I want you to sign release form and I want to get office noted from your both specialist. Then after review may get 3rd opinion.  Pt will sign contract for controlled meds and give UDS today.  Follow up in 2 weeks or as needed

## 2015-11-15 ENCOUNTER — Encounter: Payer: Self-pay | Admitting: Medical

## 2015-11-15 ENCOUNTER — Telehealth: Payer: Self-pay | Admitting: Medical

## 2015-11-15 ENCOUNTER — Ambulatory Visit (INDEPENDENT_AMBULATORY_CARE_PROVIDER_SITE_OTHER): Payer: Managed Care, Other (non HMO) | Admitting: Medical

## 2015-11-15 VITALS — BP 128/84 | HR 78 | Temp 98.1°F | Ht 72.0 in | Wt 393.0 lb

## 2015-11-15 DIAGNOSIS — Z0000 Encounter for general adult medical examination without abnormal findings: Secondary | ICD-10-CM

## 2015-11-15 DIAGNOSIS — M542 Cervicalgia: Secondary | ICD-10-CM

## 2015-11-15 DIAGNOSIS — Z113 Encounter for screening for infections with a predominantly sexual mode of transmission: Secondary | ICD-10-CM

## 2015-11-15 DIAGNOSIS — M5410 Radiculopathy, site unspecified: Secondary | ICD-10-CM

## 2015-11-15 LAB — COMPLETE METABOLIC PANEL WITH GFR
ALT: 32 U/L (ref 9–46)
AST: 27 U/L (ref 10–40)
Albumin: 4 g/dL (ref 3.6–5.1)
Alkaline Phosphatase: 74 U/L (ref 40–115)
BUN: 7 mg/dL (ref 7–25)
CALCIUM: 9.1 mg/dL (ref 8.6–10.3)
CHLORIDE: 103 mmol/L (ref 98–110)
CO2: 32 mmol/L — AB (ref 20–31)
Creat: 0.94 mg/dL (ref 0.60–1.35)
GFR, Est Non African American: >89 mL/min (ref >=60)
Glucose, Bld: 105 mg/dL — ABNORMAL HIGH (ref 65–99)
POTASSIUM: 4 mmol/L (ref 3.5–5.3)
Sodium: 139 mmol/L (ref 135–146)
Total Bilirubin: 0.3 mg/dL (ref 0.2–1.2)
Total Protein: 7.4 g/dL (ref 6.1–8.1)

## 2015-11-15 LAB — CBC WITH DIFFERENTIAL/PLATELET
BASOS ABS: 0 10*3/uL (ref 0.0–0.1)
BASOS PCT: 0.2 % (ref 0.0–3.0)
EOS ABS: 0.1 10*3/uL (ref 0.0–0.7)
Eosinophils Relative: 1.2 % (ref 0.0–5.0)
HEMATOCRIT: 42.4 % (ref 39.0–52.0)
Hemoglobin: 14 g/dL (ref 13.0–17.0)
LYMPHS ABS: 2.1 10*3/uL (ref 0.7–4.0)
LYMPHS PCT: 24.1 % (ref 12.0–46.0)
MCHC: 33.1 g/dL (ref 30.0–36.0)
MCV: 87.3 fl (ref 78.0–100.0)
MONOS PCT: 4.5 % (ref 3.0–12.0)
Monocytes Absolute: 0.4 10*3/uL (ref 0.1–1.0)
NEUTROS ABS: 6 10*3/uL (ref 1.4–7.7)
NEUTROS PCT: 70 % (ref 43.0–77.0)
PLATELETS: 266 10*3/uL (ref 150.0–400.0)
RBC: 4.85 Mil/uL (ref 4.22–5.81)
RDW: 13.8 % (ref 11.5–15.5)
WBC: 8.6 10*3/uL (ref 4.0–10.5)

## 2015-11-15 LAB — POCT URINALYSIS DIPSTICK
Bilirubin, UA: NEGATIVE
Blood, UA: NEGATIVE
Glucose, UA: NEGATIVE
Ketones, UA: NEGATIVE
LEUKOCYTES UA: NEGATIVE
NITRITE UA: NEGATIVE
PH UA: 7.5
PROTEIN UA: NEGATIVE
Spec Grav, UA: 1.02
UROBILINOGEN UA: NEGATIVE

## 2015-11-15 LAB — TSH: TSH: 1.07 u[IU]/mL (ref 0.35–4.50)

## 2015-11-15 NOTE — Progress Notes (Signed)
Subjective:    Patient ID: Louis [name], male    DOB: May 13, 1975, 40 y.o.   MRN: [id number]  HPI  Pt in fasting this am.  Pt does not take flu vaccines.  Pt works [employer]. No official exercise but states walks a lot at work.. Pt smokes 2 cigarettes. Pt takes his lunch to work.   Pt still has neck pain but not worse than usual. Pt has seen specialist for his neck pain.    Review of Systems  Constitutional: Negative for chills, fatigue and fever.  HENT: Negative for congestion, facial swelling, mouth sores, postnasal drip and voice change.   Respiratory: Negative for cough, chest tightness and wheezing.   Cardiovascular: Negative for palpitations.  Gastrointestinal: Negative for abdominal pain, blood in stool, constipation, nausea and vomiting.  Genitourinary: Negative for difficulty urinating, flank pain, hematuria, penile pain and testicular pain.  Musculoskeletal: Positive for neck pain. Negative for arthralgias and neck stiffness.       Hx of neck pain.  Skin: Negative for pallor and rash.  Neurological: Negative for dizziness, seizures, syncope, weakness and headaches.  Hematological: Negative for adenopathy.  Psychiatric/Behavioral: Negative for agitation, confusion, self-injury and suicidal ideas. The patient is not hyperactive.     Past Medical History:  Diagnosis Date  . Allergy      Social History   Social History  . Marital status: Married    Spouse name: N/A  . Number of children: N/A  . Years of education: N/A   Occupational History  . Not on file.   Social History Main Topics  . Smoking status: Current Every Day Smoker    Types: Cigarettes  . Smokeless tobacco: Never Used  . 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  . Drug use: No  . Sexual activity: Yes   Other Topics Concern  . Not on file   Social History Narrative  . No narrative on file    Past Surgical History:    Procedure Laterality Date  . ACHILLES TENDON REPAIR    . ANTERIOR CRUCIATE LIGAMENT REPAIR    . TONSILLECTOMY      Family History  Problem Relation Age of Onset  . Diabetes Father     No Known Allergies  Current Outpatient Prescriptions on File Prior to Visit  Medication Sig Dispense Refill  . cyclobenzaprine (FLEXERIL) 10 MG tablet 1 tab po bid as needed muscle spasms 60 tablet 0  . diclofenac (VOLTAREN) 75 MG EC tablet Take 1 tablet (75 mg total) by mouth 2 (two) times daily. 60 tablet 0  . omeprazole (PRILOSEC) 20 MG capsule Take 1 capsule (20 mg total) by mouth daily. 30 capsule 3  . traMADol (ULTRAM) 50 MG tablet Take 1 tablet (50 mg total) by mouth 2 (two) times daily. 60 tablet 0   No current facility-administered medications on file prior to visit.     BP 128/84 (BP Location: Right Arm, Patient Position: Sitting)   Pulse 78   Temp 98.1 F (36.7 C) (Oral)   Ht 6' (1.829 m)   Wt (!) 393 lb (178.3 kg)   SpO2 98%   BMI 53.30 kg/m       Objective:   Physical Exam  General Mental Status- Alert. General Appearance- Not in acute distress.  Obese pt.  Skin General: Color- Normal Color. Moisture- Normal Moisture.  Neck Carotid Arteries- Normal color.  Moisture- Normal Moisture. No carotid bruits. No JVD.  Chest and Lung Exam Auscultation: Breath Sounds:-Normal. CTA.  Cardiovascular Auscultation:Rythm- Regular. Murmurs & Other Heart Sounds:Auscultation of the heart reveals- No Murmurs.  Abdomen Inspection:-Inspeection Normal. Palpation/Percussion:Note:No mass. Palpation and Percussion of the abdomen reveal- Non Tender, Non Distended + BS, no rebound or guarding.    Neurologic Cranial Nerve exam:- CN III-XII intact(No nystagmus), symmetric smile. Strength:- 5/5 equal and symmetric strength both upper and lower extremities.   Male Genitourinary Urethra:- No discharge. Penis- Circumcised. Scrotum- No masses. Testes- Bilateral-Normal.        Assessment & Plan:  For your wellness exam will get cbc, cmp, tsh, lipid panel, hiv and urine analysis..  Recommend diet, some increase in exercise and weight loss.  If you change mind on flu vaccination just let us know.  Follow up date to be determined after lab review.

## 2015-11-15 NOTE — Telephone Encounter (Signed)
Will you add a1-c to labs drawn yesterday. Associate the lab result with hyperglycemia. Thanks.

## 2015-11-15 NOTE — Telephone Encounter (Signed)
Pt has a lipid panel drawn yesterday. But looks like was not  Run. Will you go ahead and run that. Thanks.

## 2015-11-15 NOTE — Progress Notes (Signed)
Pre visit review using our clinic review tool, if applicable. No additional management support is needed unless otherwise documented below in the visit note. 

## 2015-11-15 NOTE — Patient Instructions (Addendum)
For your wellness exam will get cbc, cmp, tsh, lipid panel, hiv and urine analysis.  Recommend diet, some increase in exercise and weight loss.  If you change mind on flu vaccination just let us know.  Follow up date to be determined after lab review.  Preventive Care for Adults, Male A healthy lifestyle and preventive care can promote health and wellness. Preventive health guidelines for men include the following key practices:  A routine yearly physical is a good way to check with your health care provider about your health and preventative screening. It is a chance to share any concerns and updates on your health and to receive a thorough exam.  Visit your dentist for a routine exam and preventative care every 6 months. Brush your teeth twice a day and floss once a day. Good oral hygiene prevents tooth decay and gum disease.  The frequency of eye exams is based on your age, health, family medical history, use of contact lenses, and other factors. Follow your health care provider's recommendations for frequency of eye exams.  Eat a healthy diet. Foods such as vegetables, fruits, whole grains, low-fat dairy products, and lean protein foods contain the nutrients you need without too many calories. Decrease your intake of foods high in solid fats, added sugars, and salt. Eat the right amount of calories for you.Get information about a proper diet from your health care provider, if necessary.  Regular physical exercise is one of the most important things you can do for your health. Most adults should get at least 150 minutes of moderate-intensity exercise (any activity that increases your heart rate and causes you to sweat) each week. In addition, most adults need muscle-strengthening exercises on 2 or more days a week.  Maintain a healthy weight. The body mass index (BMI) is a screening tool to identify possible weight problems. It provides an estimate of body fat based on height and weight.  Your health care provider can find your BMI and can help you achieve or maintain a healthy weight.For adults 20 years and older:  A BMI below 18.5 is considered underweight.  A BMI of 18.5 to 24.9 is normal.  A BMI of 25 to 29.9 is considered overweight.  A BMI of 30 and above is considered obese.  Maintain normal blood lipids and cholesterol levels by exercising and minimizing your intake of saturated fat. Eat a balanced diet with plenty of fruit and vegetables. Blood tests for lipids and cholesterol should begin at age 45 and be repeated every 5 years. If your lipid or cholesterol levels are high, you are over 50, or you are at high risk for heart disease, you may need your cholesterol levels checked more frequently.Ongoing high lipid and cholesterol levels should be treated with medicines if diet and exercise are not working.  If you smoke, find out from your health care provider how to quit. If you do not use tobacco, do not start.  Lung cancer screening is recommended for adults aged 66-80 years who are at high risk for developing lung cancer because of a history of smoking. A yearly low-dose CT scan of the lungs is recommended for people who have at least a 30-pack-year history of smoking and are a current smoker or have quit within the past 15 years. A pack year of smoking is smoking an average of 1 pack of cigarettes a day for 1 year (for example: 1 pack a day for 30 years or 2 packs a day for 15  years). Yearly screening should continue until the smoker has stopped smoking for at least 15 years. Yearly screening should be stopped for people who develop a health problem that would prevent them from having lung cancer treatment.  If you choose to drink alcohol, do not have more than 2 drinks per day. One drink is considered to be 12 ounces (355 mL) of beer, 5 ounces (148 mL) of wine, or 1.5 ounces (44 mL) of liquor.  Avoid use of street drugs. Do not share needles with anyone. Ask for help  if you need support or instructions about stopping the use of drugs.  High blood pressure causes heart disease and increases the risk of stroke. Your blood pressure should be checked at least every 1-2 years. Ongoing high blood pressure should be treated with medicines, if weight loss and exercise are not effective.  If you are 91-50 years old, ask your health care provider if you should take aspirin to prevent heart disease.  Diabetes screening is done by taking a blood sample to check your blood glucose level after you have not eaten for a certain period of time (fasting). If you are not overweight and you do not have risk factors for diabetes, you should be screened once every 3 years starting at age 49. If you are overweight or obese and you are 82-27 years of age, you should be screened for diabetes every year as part of your cardiovascular risk assessment.  Colorectal cancer can be detected and often prevented. Most routine colorectal cancer screening begins at the age of 95 and continues through age 47. However, your health care provider may recommend screening at an earlier age if you have risk factors for colon cancer. On a yearly basis, your health care provider may provide home test kits to check for hidden blood in the stool. Use of a small camera at the end of a tube to directly examine the colon (sigmoidoscopy or colonoscopy) can detect the earliest forms of colorectal cancer. Talk to your health care provider about this at age 60, when routine screening begins. Direct exam of the colon should be repeated every 5-10 years through age 43, unless early forms of precancerous polyps or small growths are found.  People who are at an increased risk for hepatitis B should be screened for this virus. You are considered at high risk for hepatitis B if:  You were born in a country where hepatitis B occurs often. Talk with your health care provider about which countries are considered high  risk.  Your parents were born in a high-risk country and you have not received a shot to protect against hepatitis B (hepatitis B vaccine).  You have HIV or AIDS.  You use needles to inject street drugs.  You live with, or have sex with, someone who has hepatitis B.  You are a man who has sex with other men (MSM).  You get hemodialysis treatment.  You take certain medicines for conditions such as cancer, organ transplantation, and autoimmune conditions.  Hepatitis C blood testing is recommended for all people born from [birth year] through 1965 and any individual with known risks for hepatitis C.  Practice safe sex. Use condoms and avoid high-risk sexual practices to reduce the spread of sexually transmitted infections (STIs). STIs include gonorrhea, chlamydia, syphilis, trichomonas, herpes, HPV, and human immunodeficiency virus (HIV). Herpes, HIV, and HPV are viral illnesses that have no cure. They can result in disability, cancer, and death.  If you are a  man who has sex with other men, you should be screened at least once per year for:  HIV.  Urethral, rectal, and pharyngeal infection of gonorrhea, chlamydia, or both.  If you are at risk of being infected with HIV, it is recommended that you take a prescription medicine daily to prevent HIV infection. This is called preexposure prophylaxis (PrEP). You are considered at risk if:  You are a man who has sex with other men (MSM) and have other risk factors.  You are a heterosexual man, are sexually active, and are at increased risk for HIV infection.  You take drugs by injection.  You are sexually active with a partner who has HIV.  Talk with your health care provider about whether you are at high risk of being infected with HIV. If you choose to begin PrEP, you should first be tested for HIV. You should then be tested every 3 months for as long as you are taking PrEP.  A one-time screening for abdominal aortic aneurysm (AAA) and  surgical repair of large AAAs by ultrasound are recommended for men ages 33 to 70 years who are current or former smokers.  Healthy men should no longer receive prostate-specific antigen (PSA) blood tests as part of routine cancer screening. Talk with your health care provider about prostate cancer screening.  Testicular cancer screening is not recommended for adult males who have no symptoms. Screening includes self-exam, a health care provider exam, and other screening tests. Consult with your health care provider about any symptoms you have or any concerns you have about testicular cancer.  Use sunscreen. Apply sunscreen liberally and repeatedly throughout the day. You should seek shade when your shadow is shorter than you. Protect yourself by wearing long sleeves, pants, a wide-brimmed hat, and sunglasses year round, whenever you are outdoors.  Once a month, do a whole-body skin exam, using a mirror to look at the skin on your back. Tell your health care provider about new moles, moles that have irregular borders, moles that are larger than a pencil eraser, or moles that have changed in shape or color.  Stay current with required vaccines (immunizations).  Influenza vaccine. All adults should be immunized every year.  Tetanus, diphtheria, and acellular pertussis (Td, Tdap) vaccine. An adult who has not previously received Tdap or who does not know his vaccine status should receive 1 dose of Tdap. This initial dose should be followed by tetanus and diphtheria toxoids (Td) booster doses every 10 years. Adults with an unknown or incomplete history of completing a 3-dose immunization series with Td-containing vaccines should begin or complete a primary immunization series including a Tdap dose. Adults should receive a Td booster every 10 years.  Varicella vaccine. An adult without evidence of immunity to varicella should receive 2 doses or a second dose if he has previously received 1 dose.  Human  papillomavirus (HPV) vaccine. Males aged 11-21 years who have not received the vaccine previously should receive the 3-dose series. Males aged 22-26 years may be immunized. Immunization is recommended through the age of 72 years for any male who has sex with males and did not get any or all doses earlier. Immunization is recommended for any person with an immunocompromised condition through the age of 94 years if he did not get any or all doses earlier. During the 3-dose series, the second dose should be obtained 4-8 weeks after the first dose. The third dose should be obtained 24 weeks after the first dose and  16 weeks after the second dose.  Zoster vaccine. One dose is recommended for adults aged 52 years or older unless certain conditions are present.  Measles, mumps, and rubella (MMR) vaccine. Adults born before [birth year] generally are considered immune to measles and mumps. Adults born in [birth year] or later should have 1 or more doses of MMR vaccine unless there is a contraindication to the vaccine or there is laboratory evidence of immunity to each of the three diseases. A routine second dose of MMR vaccine should be obtained at least 28 days after the first dose for students attending postsecondary schools, health care workers, or international travelers. People who received inactivated measles vaccine or an unknown type of measles vaccine during 1963-1967 should receive 2 doses of MMR vaccine. People who received inactivated mumps vaccine or an unknown type of mumps vaccine before 1979 and are at high risk for mumps infection should consider immunization with 2 doses of MMR vaccine. Unvaccinated health care workers born before [birth year] who lack laboratory evidence of measles, mumps, or rubella immunity or laboratory confirmation of disease should consider measles and mumps immunization with 2 doses of MMR vaccine or rubella immunization with 1 dose of MMR vaccine.  Pneumococcal 13-valent conjugate (PCV13) vaccine.  When indicated, a person who is uncertain of his immunization history and has no record of immunization should receive the PCV13 vaccine. All adults 76 years of age and older should receive this vaccine. An adult aged 33 years or older who has certain medical conditions and has not been previously immunized should receive 1 dose of PCV13 vaccine. This PCV13 should be followed with a dose of pneumococcal polysaccharide (PPSV23) vaccine. Adults who are at high risk for pneumococcal disease should obtain the PPSV23 vaccine at least 8 weeks after the dose of PCV13 vaccine. Adults older than 41 years of age who have normal immune system function should obtain the PPSV23 vaccine dose at least 1 year after the dose of PCV13 vaccine.  Pneumococcal polysaccharide (PPSV23) vaccine. When PCV13 is also indicated, PCV13 should be obtained first. All adults aged 33 years and older should be immunized. An adult younger than age 57 years who has certain medical conditions should be immunized. Any person who resides in a nursing home or long-term care facility should be immunized. An adult smoker should be immunized. People with an immunocompromised condition and certain other conditions should receive both PCV13 and PPSV23 vaccines. People with human immunodeficiency virus (HIV) infection should be immunized as soon as possible after diagnosis. Immunization during chemotherapy or radiation therapy should be avoided. Routine use of PPSV23 vaccine is not recommended for American Indians, [place] Natives, or people younger than 65 years unless there are medical conditions that require PPSV23 vaccine. When indicated, people who have unknown immunization and have no record of immunization should receive PPSV23 vaccine. One-time revaccination 5 years after the first dose of PPSV23 is recommended for people aged 19-64 years who have chronic kidney failure, nephrotic syndrome, asplenia, or immunocompromised conditions. People who received  1-2 doses of PPSV23 before age 34 years should receive another dose of PPSV23 vaccine at age 43 years or later if at least 5 years have passed since the previous dose. Doses of PPSV23 are not needed for people immunized with PPSV23 at or after age 62 years.  Meningococcal vaccine. Adults with asplenia or persistent complement component deficiencies should receive 2 doses of quadrivalent meningococcal conjugate (MenACWY-D) vaccine. The doses should be obtained at least 2 months apart. Microbiologists working with  certain meningococcal bacteria, [employer] recruits, people at risk during an outbreak, and people who travel to or live in countries with a high rate of meningitis should be immunized. A first-year college student up through age 30 years who is living in a residence hall should receive a dose if he did not receive a dose on or after his 16th birthday. Adults who have certain high-risk conditions should receive one or more doses of vaccine.  Hepatitis A vaccine. Adults who wish to be protected from this disease, have chronic liver disease, work with hepatitis A-infected animals, work in hepatitis A research labs, or travel to or work in countries with a high rate of hepatitis A should be immunized. Adults who were previously unvaccinated and who anticipate close contact with an international adoptee during the first 60 days after arrival in the [place] from a country with a high rate of hepatitis A should be immunized.  Hepatitis B vaccine. Adults should be immunized if they wish to be protected from this disease, are under age 25 years and have diabetes, have chronic liver disease, have had more than one sex partner in the past 6 months, may be exposed to blood or other infectious body fluids, are household contacts or sex partners of hepatitis B positive people, are clients or workers in certain care facilities, or travel to or work in countries with a high rate of hepatitis B.  Haemophilus  influenzae type b (Hib) vaccine. A previously unvaccinated person with asplenia or sickle cell disease or having a scheduled splenectomy should receive 1 dose of Hib vaccine. Regardless of previous immunization, a recipient of a hematopoietic stem cell transplant should receive a 3-dose series 6-12 months after his successful transplant. Hib vaccine is not recommended for adults with HIV infection. Preventive Service / Frequency Ages 93 to 33  Blood pressure check.** / Every 3-5 years.  Lipid and cholesterol check.** / Every 5 years beginning at age 73.  Hepatitis C blood test.** / For any individual with known risks for hepatitis C.  Skin self-exam. / Monthly.  Influenza vaccine. / Every year.  Tetanus, diphtheria, and acellular pertussis (Tdap, Td) vaccine.** / Consult your health care provider. 1 dose of Td every 10 years.  Varicella vaccine.** / Consult your health care provider.  HPV vaccine. / 3 doses over 6 months, if 48 or younger.  Measles, mumps, rubella (MMR) vaccine.** / You need at least 1 dose of MMR if you were born in 1957 or later. You may also need a second dose.  Pneumococcal 13-valent conjugate (PCV13) vaccine.** / Consult your health care provider.  Pneumococcal polysaccharide (PPSV23) vaccine.** / 1 to 2 doses if you smoke cigarettes or if you have certain conditions.  Meningococcal vaccine.** / 1 dose if you are age 81 to 5 years and a Market researcher living in a residence hall, or have one of several medical conditions. You may also need additional booster doses.  Hepatitis A vaccine.** / Consult your health care provider.  Hepatitis B vaccine.** / Consult your health care provider.  Haemophilus influenzae type b (Hib) vaccine.** / Consult your health care provider. Ages 49 to 40  Blood pressure check.** / Every year.  Lipid and cholesterol check.** / Every 5 years beginning at age 20.  Lung cancer screening. / Every year if you are aged  47-80 years and have a 30-pack-year history of smoking and currently smoke or have quit within the past 15 years. Yearly screening is stopped once you  have quit smoking for at least 15 years or develop a health problem that would prevent you from having lung cancer treatment.  Fecal occult blood test (FOBT) of stool. / Every year beginning at age 34 and continuing until age 56. You may not have to do this test if you get a colonoscopy every 10 years.  Flexible sigmoidoscopy** or colonoscopy.** / Every 5 years for a flexible sigmoidoscopy or every 10 years for a colonoscopy beginning at age 66 and continuing until age 26.  Hepatitis C blood test.** / For all people born from [birth year] through 1965 and any individual with known risks for hepatitis C.  Skin self-exam. / Monthly.  Influenza vaccine. / Every year.  Tetanus, diphtheria, and acellular pertussis (Tdap/Td) vaccine.** / Consult your health care provider. 1 dose of Td every 10 years.  Varicella vaccine.** / Consult your health care provider.  Zoster vaccine.** / 1 dose for adults aged 44 years or older.  Measles, mumps, rubella (MMR) vaccine.** / You need at least 1 dose of MMR if you were born in 1957 or later. You may also need a second dose.  Pneumococcal 13-valent conjugate (PCV13) vaccine.** / Consult your health care provider.  Pneumococcal polysaccharide (PPSV23) vaccine.** / 1 to 2 doses if you smoke cigarettes or if you have certain conditions.  Meningococcal vaccine.** / Consult your health care provider.  Hepatitis A vaccine.** / Consult your health care provider.  Hepatitis B vaccine.** / Consult your health care provider.  Haemophilus influenzae type b (Hib) vaccine.** / Consult your health care provider. Ages 14 and over  Blood pressure check.** / Every year.  Lipid and cholesterol check.**/ Every 5 years beginning at age 53.  Lung cancer screening. / Every year if you are aged 31-80 years and have a 30-pack-year  history of smoking and currently smoke or have quit within the past 15 years. Yearly screening is stopped once you have quit smoking for at least 15 years or develop a health problem that would prevent you from having lung cancer treatment.  Fecal occult blood test (FOBT) of stool. / Every year beginning at age 4 and continuing until age 82. You may not have to do this test if you get a colonoscopy every 10 years.  Flexible sigmoidoscopy** or colonoscopy.** / Every 5 years for a flexible sigmoidoscopy or every 10 years for a colonoscopy beginning at age 54 and continuing until age 25.  Hepatitis C blood test.** / For all people born from [birth year] through 1966 and any individual with known risks for hepatitis C.  Abdominal aortic aneurysm (AAA) screening.** / A one-time screening for ages 88 to 53 years who are current or former smokers.  Skin self-exam. / Monthly.  Influenza vaccine. / Every year.  Tetanus, diphtheria, and acellular pertussis (Tdap/Td) vaccine.** / 1 dose of Td every 10 years.  Varicella vaccine.** / Consult your health care provider.  Zoster vaccine.** / 1 dose for adults aged 3 years or older.  Pneumococcal 13-valent conjugate (PCV13) vaccine.** / 1 dose for all adults aged 56 years and older.  Pneumococcal polysaccharide (PPSV23) vaccine.** / 1 dose for all adults aged 59 years and older.  Meningococcal vaccine.** / Consult your health care provider.  Hepatitis A vaccine.** / Consult your health care provider.  Hepatitis B vaccine.** / Consult your health care provider.  Haemophilus influenzae type b (Hib) vaccine.** / Consult your health care provider. **Family history and personal history of risk and conditions may change your health care provider's  recommendations.   This information is not intended to replace advice given to you by your health care provider. Make sure you discuss any questions you have with your health care provider.   Document Released:  02/14/2001 Document Revised: 01/09/2014 Document Reviewed: 05/16/2010 Elsevier Interactive Patient Education [employer].

## 2015-11-15 NOTE — Telephone Encounter (Signed)
Referral to neurosurgeon

## 2015-11-16 ENCOUNTER — Other Ambulatory Visit (INDEPENDENT_AMBULATORY_CARE_PROVIDER_SITE_OTHER): Payer: Managed Care, Other (non HMO)

## 2015-11-16 ENCOUNTER — Telehealth: Payer: Self-pay | Admitting: Medical

## 2015-11-16 DIAGNOSIS — R739 Hyperglycemia, unspecified: Secondary | ICD-10-CM

## 2015-11-16 LAB — LIPID PANEL
CHOLESTEROL: 175 mg/dL (ref 0–200)
HDL: 50.8 mg/dL (ref >39.00)
LDL CALC: 111 mg/dL — AB (ref 0–99)
NonHDL: 123.84
TRIGLYCERIDES: 63 mg/dL (ref 0.0–149.0)
Total CHOL/HDL Ratio: 3
VLDL: 12.6 mg/dL (ref 0.0–40.0)

## 2015-11-16 LAB — HIV ANTIBODY (ROUTINE TESTING W REFLEX): HIV 1&2 Ab, 4th Generation: NONREACTIVE

## 2015-11-16 LAB — HEMOGLOBIN A1C: Hgb A1c MFr Bld: 5.8 % (ref 4.6–6.5)

## 2015-11-16 NOTE — Telephone Encounter (Signed)
Lipid panel released from future orders and I [facility]. [facility] A[facility].  KMP

## 2015-11-16 NOTE — Addendum Note (Signed)
Addended by: Harley AltoPRICE, KRISTY M on: 11/16/2015 08:08 AM   Modules accepted: Orders

## 2015-11-16 NOTE — Telephone Encounter (Signed)
This was the pt I was trying to show you yeserday. But picked up wrong uds. On this pt they only tested for tramadol. They did not do 11 panel test. So in my opinion the test was useless. Tramadol was not detected. He was not on medication at the time. He was starting contract and had run out of tramadol given by other provider. So can he give another sample and not be charged for this one?

## 2015-11-27 ENCOUNTER — Other Ambulatory Visit: Payer: Self-pay | Admitting: Medical

## 2015-11-30 ENCOUNTER — Telehealth: Payer: Self-pay | Admitting: Medical

## 2015-11-30 MED ORDER — TRAMADOL HCL 50 MG PO TABS: 50.0000 mg | ORAL_TABLET | Freq: Two times a day (BID) | ORAL | 0 refills | Status: DC

## 2015-11-30 NOTE — Telephone Encounter (Signed)
Pt can pick up tramadol tomorrow. If you get off printer. I will sign the rx.

## 2015-11-30 NOTE — Telephone Encounter (Signed)
Last seen 11/15/15 Last filled 10/28/15 #[id number] [id number]  [id number]:Take 1 tablet (50 mg total) by mouth 2 (two) times daily.  Please advise   PC

## 2015-12-01 NOTE — Telephone Encounter (Signed)
Rx faxed to pharmacy  

## 2015-12-09 ENCOUNTER — Other Ambulatory Visit: Payer: Self-pay | Admitting: Medical

## 2015-12-22 ENCOUNTER — Other Ambulatory Visit: Payer: Self-pay | Admitting: *Deleted

## 2015-12-22 MED ORDER — OMEPRAZOLE 20 MG PO CPDR: 20.0000 mg | DELAYED_RELEASE_CAPSULE | Freq: Every day | ORAL | 1 refills | Status: DC

## 2015-12-22 NOTE — Progress Notes (Signed)
Rx request for 90-day supply faxed from pharmacy; [facility]SL[facility]

## 2015-12-24 ENCOUNTER — Ambulatory Visit (INDEPENDENT_AMBULATORY_CARE_PROVIDER_SITE_OTHER): Payer: Managed Care, Other (non HMO) | Admitting: Medical

## 2015-12-24 ENCOUNTER — Encounter: Payer: Self-pay | Admitting: Medical

## 2015-12-24 VITALS — BP 128/98 | HR 66 | Temp 98.2°F | Resp 18 | Ht 72.0 in | Wt 389.1 lb

## 2015-12-24 DIAGNOSIS — E669 Obesity, unspecified: Secondary | ICD-10-CM

## 2015-12-24 DIAGNOSIS — M542 Cervicalgia: Secondary | ICD-10-CM | POA: Diagnosis not present

## 2015-12-24 DIAGNOSIS — M792 Neuralgia and neuritis, unspecified: Secondary | ICD-10-CM | POA: Diagnosis not present

## 2015-12-24 DIAGNOSIS — R200 Anesthesia of skin: Secondary | ICD-10-CM

## 2015-12-24 MED ORDER — TRAMADOL HCL 50 MG PO TABS: ORAL_TABLET | ORAL | 0 refills | Status: DC

## 2015-12-24 MED ORDER — CYCLOBENZAPRINE HCL 10 MG PO TABS: ORAL_TABLET | ORAL | 2 refills | Status: DC

## 2015-12-24 NOTE — Progress Notes (Signed)
Pre visit review using our clinic review tool, if applicable. No additional management support is needed unless otherwise documented below in the visit note/SLS  

## 2015-12-24 NOTE — Progress Notes (Addendum)
Subjective:    Patient ID: Louis RiggersAvery [name], male    DOB: 1975-04-15, 40 y.o.   MRN: [id number]  HPI  Pt in for follow up.  I referred pt to different surgeon. Pt has had varying opinion on surgery. Pt states the most recent surgeon minimized his symptoms. Pt described that when only rubs top of his head felt like some hits him on the head. Months ago in march or April had severe neck pain an electric shock sensation in upper ext. Since then pain in upper back /neck pain and intermittent tingling and numbness to hands. Some numbness to rt hand. But left hand does feel numb very frequently.  1st specialist  recommended surgery but cautioned him on potential complications of surgery.(this made patient very hesitant) His second referral thought he needed surgery. Most recent specialist gave varying opinion and seemed uncertain. He asked pt what pt wanted to do. Pt is frustrated with situation.  Pt pain still is present and effects him daily. Pt does manual labor/heavy work.   Pt has not seen PT    For pain pt takes 2 tramadol in the am. Also 2 ibuprofen in am. In pm 2 tramadol and 1 ibuproefen in am. Pt use flexeril early am. And pm. But not driving or opeartating heavy machinery.  Review of Systems  Constitutional: Negative for chills and fatigue.  Respiratory: Negative for chest tightness, shortness of breath and wheezing.   Cardiovascular: Negative for chest pain and palpitations.  Gastrointestinal: Negative for abdominal pain.  Musculoskeletal: Positive for neck pain. Negative for back pain.       See hpi.  Skin: Negative for rash.  Neurological: Negative for dizziness and headaches.       See hpi.  Hematological: Negative for adenopathy. Does not bruise/bleed easily.  Psychiatric/Behavioral: Negative for behavioral problems and confusion.    Past Medical History:  Diagnosis Date  . Allergy      Social History   Social History  . Marital status: Married    Spouse name: N/A    . Number of children: N/A  . Years of education: N/A   Occupational History  . Not on file.   Social History Main Topics  . Smoking status: Current Every Day Smoker    Types: Cigarettes  . Smokeless tobacco: Never Used  . 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  . Drug use: No  . Sexual activity: Yes   Other Topics Concern  . Not on file   Social History Narrative  . No narrative on file    Past Surgical History:  Procedure Laterality Date  . ACHILLES TENDON REPAIR    . ANTERIOR CRUCIATE LIGAMENT REPAIR    . TONSILLECTOMY      Family History  Problem Relation Age of Onset  . Diabetes Father     No Known Allergies  Current Outpatient Prescriptions on File Prior to Visit  Medication Sig Dispense Refill  . cyclobenzaprine (FLEXERIL) 10 MG tablet 1 tab po bid as needed muscle spasms 60 tablet 0  . diclofenac (VOLTAREN) 75 MG EC tablet Take 1 tablet (75 mg total) by mouth 2 (two) times daily. 60 tablet 0  . omeprazole (PRILOSEC) 20 MG capsule Take 1 capsule (20 mg total) by mouth daily. 90 capsule 1  . traMADol (ULTRAM) 50 MG tablet Take 1 tablet (50 mg total) by mouth 2 (two)  times daily. 60 tablet 0   No current facility-administered medications on file prior to visit.     BP (!) 128/98 (BP Location: Right Arm, Patient Position: Sitting, Cuff Size: Large)   Pulse 66   Temp 98.2 F (36.8 C) (Oral)   Resp 18   Ht 6' (1.829 m)   Wt (!) 389 lb 2 oz (176.5 kg)   SpO2 (!) 18%   BMI 52.77 kg/m       Objective:   Physical Exam  General Mental Status- Alert. General Appearance- Not in acute distress.    Skin General: Color- Normal Color. Moisture- Normal Moisture.  Neck Carotid Arteries- Normal color. Moisture- Normal Moisture. No carotid bruits. No JVD.Very thick neck.  Chest and Lung Exam Auscultation: Breath Sounds:-Normal. CTA.  Cardiovascular Auscultation:Rythm- Regular, Rate and  rythm Murmurs & Other Heart Sounds:Auscultation of the heart reveals- No Murmurs.    Neurologic Cranial Nerve exam:- CN III-XII intact(No nystagmus), symmetric smile. Strength:- 4/5 equal lt hand strenghth. Sharp and dull discriminiation now on left hand not present.   Rt hand 5/5. Good sensation.      Assessment & Plan:  For your neck pain, radiating pain to upper arms and hand numbness, I will refer you to Dr. Danielle DessElsner. Hopefully this specialist will give you a definitive opinion on next step.  I will refill your tramadol and flexeril..  I will ask Victorino DikeJennifer to call you and coordinate referral and get all records from prior specialist.  I also can refer you to specialist to discuss bariatric type surgery. But I don't think this is a priority presently but could be factor in neck surgery.  Follow up in 4-6 weeks or as needed  I did spend 45 minutes or more with pt. 50% of time spent was counseling and discussion of his current status potential surgery, varying opinions and how to proceed from here in light of fact that he has already seen 3 specialist and no definitive plan yet.    Jarron Curley, Ramon DredgeEdward, PA-C

## 2015-12-24 NOTE — Patient Instructions (Addendum)
For your neck pain, radiating pain to upper arms and hand numbness, I will refer you to Dr. Danielle DessElsner. Hopefully this specialist will give you a definitive opinion on next step.  I will refill your tramadol and flexeril..  I will ask Victorino DikeJennifer to call you and coordinate referral and get all records from prior specialist.  I also can refer you to specialist to discuss bariatric type surgery. But I don't think this is a priority presently but could be factor in neck surgery.  Follow up in 4-6 weeks or as needed

## 2016-01-28 ENCOUNTER — Ambulatory Visit: Payer: Managed Care, Other (non HMO) | Admitting: Medical

## 2016-02-03 ENCOUNTER — Other Ambulatory Visit: Payer: Self-pay | Admitting: Medical

## 2016-02-09 ENCOUNTER — Telehealth: Payer: Self-pay | Admitting: Medical

## 2016-02-09 NOTE — Telephone Encounter (Signed)
It appears pt has appointment with specialist on 03-13-2016. Will you call pt and confirm. This is what is in epic. [facility]. I will give you those records. Will you make copies of these. And make sure they get records. Document they were sent.   When you make copies will you give them back to me and will place for scanning.

## 2016-02-16 ENCOUNTER — Other Ambulatory Visit: Payer: Self-pay | Admitting: Neurological Surgery

## 2016-02-23 ENCOUNTER — Other Ambulatory Visit: Payer: Self-pay | Admitting: Medical

## 2016-02-29 MED ORDER — DICLOFENAC SODIUM 75 MG PO TBEC: 75.0000 mg | DELAYED_RELEASE_TABLET | Freq: Two times a day (BID) | ORAL | 0 refills | Status: DC

## 2016-02-29 NOTE — Telephone Encounter (Signed)
Pharmacy called to follow up on this medication request. They are not sure why it has been refused but would like to notify the patient. Please advise

## 2016-02-29 NOTE — Telephone Encounter (Signed)
Refill sent per [facility] refill protocol; duplicate Rx denied/SLS 02/27

## 2016-04-01 ENCOUNTER — Other Ambulatory Visit: Payer: Self-pay | Admitting: Medical

## 2016-04-07 ENCOUNTER — Ambulatory Visit: Payer: Managed Care, Other (non HMO) | Admitting: Medical

## 2016-04-11 NOTE — Telephone Encounter (Signed)
Opened in error

## 2016-04-17 ENCOUNTER — Ambulatory Visit (HOSPITAL_BASED_OUTPATIENT_CLINIC_OR_DEPARTMENT_OTHER)
Admission: RE | Admit: 2016-04-17 | Discharge: 2016-04-17 | Disposition: A | Discharge status: Home / Self Care | Payer: Managed Care, Other (non HMO) | Source: Ambulatory Visit | Attending: Medical | Admitting: Medical

## 2016-04-17 ENCOUNTER — Other Ambulatory Visit: Payer: Self-pay | Admitting: Medical

## 2016-04-17 ENCOUNTER — Encounter: Payer: Self-pay | Admitting: Medical

## 2016-04-17 ENCOUNTER — Telehealth: Payer: Self-pay | Admitting: Medical

## 2016-04-17 ENCOUNTER — Ambulatory Visit (INDEPENDENT_AMBULATORY_CARE_PROVIDER_SITE_OTHER): Payer: Managed Care, Other (non HMO) | Admitting: Medical

## 2016-04-17 VITALS — BP 138/80 | HR 72 | Temp 98.2°F | Resp 16 | Ht 74.0 in | Wt 390.6 lb

## 2016-04-17 DIAGNOSIS — M25561 Pain in right knee: Secondary | ICD-10-CM | POA: Diagnosis present

## 2016-04-17 DIAGNOSIS — R350 Frequency of micturition: Secondary | ICD-10-CM

## 2016-04-17 DIAGNOSIS — G8929 Other chronic pain: Secondary | ICD-10-CM | POA: Insufficient documentation

## 2016-04-17 DIAGNOSIS — E669 Obesity, unspecified: Secondary | ICD-10-CM

## 2016-04-17 DIAGNOSIS — M542 Cervicalgia: Secondary | ICD-10-CM

## 2016-04-17 LAB — POC URINALSYSI DIPSTICK (AUTOMATED)
BILIRUBIN UA: NEGATIVE
GLUCOSE UA: NEGATIVE
KETONES UA: NEGATIVE
LEUKOCYTES UA: NEGATIVE
NITRITE UA: NEGATIVE
Protein, UA: NEGATIVE
RBC UA: NEGATIVE
Spec Grav, UA: >=1.03 — AB (ref 1.010–1.025)
Urobilinogen, UA: NEGATIVE E.U./dL — AB
pH, UA: 6 (ref 5.0–8.0)

## 2016-04-17 MED ORDER — CYCLOBENZAPRINE HCL 10 MG PO TABS: ORAL_TABLET | ORAL | 2 refills | Status: DC

## 2016-04-17 MED ORDER — TRAMADOL HCL 50 MG PO TABS: ORAL_TABLET | ORAL | 0 refills | Status: DC

## 2016-04-17 MED ORDER — DICLOFENAC SODIUM 75 MG PO TBEC: 75.0000 mg | DELAYED_RELEASE_TABLET | Freq: Two times a day (BID) | ORAL | 0 refills | Status: DC

## 2016-04-17 NOTE — Progress Notes (Signed)
Pre visit review using our clinic review tool, if applicable. No additional management support is needed unless otherwise documented below in the visit note. 

## 2016-04-17 NOTE — Progress Notes (Signed)
Subjective:    Patient ID: Louis [name], male    DOB: [birth year], 41 y.o.   MRN: [id number]  HPI  Pt in for follow up.  Pt states he feels better since last visit. He states his stress and anxiety about neck problems. He is happy with approach of his most recent surgeon. Pt has upcoming surgery June 12, 2016. Pt states  that his surgeon wanted to do the surgery earlier but pt needed to get prepared for surgery. Sounds like he wants to be better prepared financially before being off for 2 months.   For his neck pain with some intermittent pains to his arms on and off. He was doing well with diclofenac, tramadol and flexeril. His former regimen of pain medicine was helping him most of the time. He ran out and has been of meds for some time. Then recently when stressed at work he states felt some pain shooting down his arms at times.(from his neck).  Pt has some rt knee pain for 2 months or more. He has medial aspect pain but pain is moderate to severe.   Pt states recently some very frequent urination at times. Last 3-4 weeks will have to urinate very frequently. About 1/2 the time he will have to urinate very quickly(urge will hit him and he will have to run to bathroom). Pt does describe some hx of low back pain in the past but not recently.  No family history of any prostate issues dads or uncles. Pt states he has no brothers.    Review of Systems  Constitutional: Negative for chills, fatigue and fever.  Respiratory: Negative for cough, chest tightness, shortness of breath and wheezing.   Cardiovascular: Negative for chest pain and palpitations.  Genitourinary: Positive for frequency. Negative for difficulty urinating, dysuria, penile swelling, testicular pain and urgency.       He can't control his urine when he get the urge to urinate.  Musculoskeletal: Negative for back pain.       Rt knee pain.  Neck pain.  Skin: Negative for rash.  Neurological: Negative for dizziness, speech  difficulty, weakness and headaches.  Hematological: Negative for adenopathy. Does not bruise/bleed easily.  Psychiatric/Behavioral: Negative for behavioral problems, decreased concentration and hallucinations.    Past Medical History:  Diagnosis Date  . Allergy      Social History   Social History  . Marital status: Married    Spouse name: N/A  . Number of children: N/A  . Years of education: N/A   Occupational History  . Not on file.   Social History Main Topics  . Smoking status: Current Every Day Smoker    Types: Cigarettes  . Smokeless tobacco: Never Used  . 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  . Drug use: No  . Sexual activity: Yes   Other Topics Concern  . Not on file   Social History Narrative  . No narrative on file    Past Surgical History:  Procedure Laterality Date  . ACHILLES TENDON REPAIR    . ANTERIOR CRUCIATE LIGAMENT REPAIR    . TONSILLECTOMY      Family History  Problem Relation Age of Onset  . Diabetes Father     No Known Allergies  Current Outpatient Prescriptions on File Prior to Visit  Medication Sig Dispense Refill  . cyclobenzaprine (FLEXERIL) 10 MG tablet 1 tab  po bid as needed muscle spasms 60 tablet 2  . diclofenac (VOLTAREN) 75 MG EC tablet TAKE 1 TABLET (75 MG TOTAL) BY MOUTH 2 (TWO) TIMES DAILY. 60 tablet 0  . omeprazole (PRILOSEC) 20 MG capsule Take 1 capsule (20 mg total) by mouth daily. 90 capsule 1  . traMADol (ULTRAM) 50 MG tablet 1 tab po q 6 hrs prn pain 120 tablet 0   No current facility-administered medications on file prior to visit.     BP 138/80   Pulse 72   Temp 98.2 F (36.8 C) (Oral)   Resp 16   Ht  (1.88 m)   Wt (!) 390 lb 9.6 oz (177.2 kg)   SpO2 97%   BMI 50.15 kg/m       Objective:   Physical Exam  General Mental Status- Alert. General Appearance- Not in acute distress.    Skin General: Color- Normal Color. Moisture-  Normal Moisture.  Neck Carotid Arteries- Normal color. Moisture- Normal Moisture. No carotid bruits. No JVD.Very thick neck.  Chest and Lung Exam Auscultation: Breath Sounds:-Normal. CTA.  Cardiovascular Auscultation:Rythm- Regular, Rate and rythm Murmurs & Other Heart Sounds:Auscultation of the heart reveals- No Murmurs.   Neurologic Cranial Nerve exam:- CN III-XII intact(No nystagmus), symmetric smile.   Back- no cva tenderness. NO mid lspine pain.  Abdomen- soft, nt, nd,+bs, no rebound or guarding.     Assessment & Plan:  For your neck pain I am refilling your tramadol, flexeril and diclofenac. Sounds like Dr. Danielle Dess has a good plan in place.  For your rt knee pain will get xray of knee today. Then after review may need to refer to sports med or orthopedist.  For frequent urination we got ua today, urine culture, cmp, and psa. After lab review may rx medication. (type would depend on lab results)  Keep in mind if low back pain occurs with urinary symptoms then we need to do lumbar imaging studies.  Will refer you to weight loss clinic.  Follow up in 7 days or as needed  Louis [name], Ramon Dredge, [employer]

## 2016-04-17 NOTE — Patient Instructions (Addendum)
For your neck pain I am refilling your tramadol, flexeril and diclofenac. Sounds like Dr. Danielle Dess has a good plan in place.  For your rt knee pain will get xray of knee today. Then after review may need to refer to sports med or orthopedist.  For frequent urination we got ua today, urine culture, cmp, and psa. After lab review may rx medication.(type would depend on lab results)  Keep in mind if low back pain occurs with urinary symptoms then we need to do lumbar imaging studies.  Will refer you to weight loss clinic.  Follow up in 7 days or as needed

## 2016-04-17 NOTE — Telephone Encounter (Signed)
Referral to sports med placed. But call pt and maybe get some specifics on day that might work for him first.

## 2016-04-18 ENCOUNTER — Telehealth: Payer: Self-pay

## 2016-04-18 LAB — URINE CULTURE: ORGANISM ID, BACTERIA: NO GROWTH

## 2016-04-18 NOTE — Telephone Encounter (Signed)
Spoke with pt, pt state he understand results, and would like the referral to see sports medicine. Provider entered referral for pt to see sports medicine. Patient had no further questions or concerns at time. LB

## 2016-04-18 NOTE — Telephone Encounter (Signed)
-----   Message from Esperanza Richters, PA-C sent at 04/17/2016  9:04 PM EDT ----- No finding on xray. I can refer to sports medicine in our building. Will go ahead and make that referral. You may have strained medial ligament of knee. That would not show up on xray.

## 2016-05-09 ENCOUNTER — Encounter: Payer: Self-pay | Admitting: Family Medicine

## 2016-05-09 ENCOUNTER — Ambulatory Visit (INDEPENDENT_AMBULATORY_CARE_PROVIDER_SITE_OTHER): Payer: Managed Care, Other (non HMO) | Admitting: Family Medicine

## 2016-05-09 DIAGNOSIS — G8929 Other chronic pain: Secondary | ICD-10-CM | POA: Diagnosis not present

## 2016-05-09 DIAGNOSIS — M25561 Pain in right knee: Secondary | ICD-10-CM

## 2016-05-09 NOTE — Patient Instructions (Signed)
You have a medial meniscus tear in your right knee. Most of these either heal on their own or become asymptomatic (where you no longer have pain) without needing surgery. Home exercises are very important - straight leg raises, knee extensions - add ankle weight if these become too easy. Icing 15 minutes at a time 3-4 times a day. Aleve 2 tabs twice a day with food as needed for pain and inflammation. Avoid squats, kneeling, lunges as much as possible. Consider physical therapy, cortisone injection - let me know if you want to try either of these. Follow up with me if you want to do either of the above.

## 2016-05-15 DIAGNOSIS — M25561 Pain in right knee: Secondary | ICD-10-CM | POA: Insufficient documentation

## 2016-05-15 NOTE — Assessment & Plan Note (Signed)
2/2 medial meniscus tear.  Independently reviewed radiographs and no evidence fracture, DJD, other bony abnormalities.  Reassured patient.  He would like to start with home exercises, icing, aleve.  Consider physical therapy, injection if not improving.  F/u if wants to pursue either of these.  MRI if not improving with conservative treatment.

## 2016-05-15 NOTE — Progress Notes (Signed)
PCP and consultation requested by: Esperanza RichtersSaguier, Edward, PA-C  Subjective:   HPI: Patient is a 41 y.o. male here for right knee pain.  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 Sharp pain with swelling. Then recalls in March feeling a burning pain medially when he went to get up from kneeling. Pain level 6/10, sharp. Worse with ambulation. He takes tramadol and flexeril for neck - due to have surgery soon. No skin changes, numbness.  Past Medical History:  Diagnosis Date  . Allergy     Current Outpatient Prescriptions on File Prior to Visit  Medication Sig Dispense Refill  . cyclobenzaprine (FLEXERIL) 10 MG tablet 1 tab po bid as needed muscle spasms 60 tablet 2  . diclofenac (VOLTAREN) 75 MG EC tablet Take 1 tablet (75 mg total) by mouth 2 (two) times daily. 60 tablet 0  . omeprazole (PRILOSEC) 20 MG capsule Take 1 capsule (20 mg total) by mouth daily. 90 capsule 1  . traMADol (ULTRAM) 50 MG tablet 1 tab po q 6 hrs prn pain 120 tablet 0   No current facility-administered medications on file prior to visit.     Past Surgical History:  Procedure Laterality Date  . ACHILLES TENDON REPAIR    . ANTERIOR CRUCIATE LIGAMENT REPAIR    . TONSILLECTOMY      No Known Allergies  Social History   Social History  . Marital status: Married    Spouse name: N/A  . Number of children: N/A  . Years of education: N/A   Occupational History  . Not on file.   Social History Main Topics  . Smoking status: Current Every Day Smoker    Types: Cigarettes  . Smokeless tobacco: Never Used  . 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  . Drug use: No  . Sexual activity: Yes   Other Topics Concern  . Not on file   Social History Narrative  . No narrative on file    Family History  Problem Relation Age of Onset  . Diabetes Father     BP (!)  151/98   Pulse 79   Ht 5\' 11"  (1.803 m)   Wt (!) 380 lb (172.4 kg)   BMI 53.00 kg/m   Review of Systems: See HPI above.     Objective:  Physical Exam:  Gen: NAD, comfortable in exam room  Right knee: No gross deformity, ecchymoses.  Trace effusion. Medial joint line tenderness.  No other tenderness. FROM. Negative ant/post drawers. Negative valgus/varus testing. Negative lachmanns. Positive mcmurrays, apleys.  Negative patellar apprehension. NV intact distally.  Left knee: FROM without pain.   Assessment & Plan:  1. Right knee pain - 2/2 medial meniscus tear.  Independently reviewed radiographs and no evidence fracture, DJD, other bony abnormalities.  Reassured patient.  He would like to start with home exercises, icing, aleve.  Consider physical therapy, injection if not improving.  F/u if wants to pursue either of these.  MRI if not improving with conservative treatment.

## 2016-06-01 ENCOUNTER — Encounter (HOSPITAL_COMMUNITY)
Admission: RE | Admit: 2016-06-01 | Discharge: 2016-06-01 | Disposition: A | Discharge status: Home / Self Care | Payer: Managed Care, Other (non HMO) | Source: Ambulatory Visit | Attending: Neurological Surgery | Admitting: Neurological Surgery

## 2016-06-01 ENCOUNTER — Encounter (HOSPITAL_COMMUNITY): Payer: Self-pay

## 2016-06-01 DIAGNOSIS — R9431 Abnormal electrocardiogram [ECG] [EKG]: Secondary | ICD-10-CM | POA: Diagnosis not present

## 2016-06-01 DIAGNOSIS — Z01810 Encounter for preprocedural cardiovascular examination: Secondary | ICD-10-CM | POA: Diagnosis not present

## 2016-06-01 DIAGNOSIS — Z01818 Encounter for other preprocedural examination: Secondary | ICD-10-CM | POA: Diagnosis present

## 2016-06-01 DIAGNOSIS — Z01812 Encounter for preprocedural laboratory examination: Secondary | ICD-10-CM | POA: Insufficient documentation

## 2016-06-01 HISTORY — DX: Unspecified osteoarthritis, unspecified site: M19.90

## 2016-06-01 LAB — BASIC METABOLIC PANEL
ANION GAP: 6 (ref 5–15)
BUN: 7 mg/dL (ref 6–20)
CHLORIDE: 104 mmol/L (ref 101–111)
CO2: 29 mmol/L (ref 22–32)
Calcium: 8.7 mg/dL — ABNORMAL LOW (ref 8.9–10.3)
Creatinine, Ser: 1.03 mg/dL (ref 0.61–1.24)
GFR calc Af Amer: >60 mL/min (ref >60)
GFR calc non Af Amer: >60 mL/min (ref >60)
GLUCOSE: 97 mg/dL (ref 65–99)
Potassium: 3.6 mmol/L (ref 3.5–5.1)
Sodium: 139 mmol/L (ref 135–145)

## 2016-06-01 LAB — CBC
HEMATOCRIT: 43.3 % (ref 39.0–52.0)
Hemoglobin: 13.5 g/dL (ref 13.0–17.0)
MCH: 28 pg (ref 26.0–34.0)
MCHC: 31.2 g/dL (ref 30.0–36.0)
MCV: 89.8 fL (ref 78.0–100.0)
Platelets: 252 10*3/uL (ref 150–400)
RBC: 4.82 MIL/uL (ref 4.22–5.81)
RDW: 13.9 % (ref 11.5–15.5)
WBC: 11.2 10*3/uL — AB (ref 4.0–10.5)

## 2016-06-01 LAB — SURGICAL PCR SCREEN
MRSA, PCR: NEGATIVE
Staphylococcus aureus: POSITIVE — AB

## 2016-06-01 NOTE — Pre-Procedure Instructions (Signed)
Louis [name]  06/01/2016      [id number] [address] [place], [place] [phone] [place] [address] Phone: [phone] Fax: [phone]    Your procedure is scheduled on Mon. June 11  [facility] at 5:30  A.M.  Call this number if you have problems the morning of surgery:  [phone]   Remember:  Do not eat food or drink liquids after midnight.  Take these medicines the morning of surgery with A SIP OF WATER : flexeril if needed,omeprazole (prilosec) if needed, eye drops, tramadol if needed               1 week prior to surgery stop:   advil, motrin, aleve, ibuprofen, BC Powders, Goody's, vitamins/herbal medicine.   Do not wear jewelry.  Do not wear lotions, powders, or perfumes, or deoderant.  Do not shave 48 hours prior to surgery.  Men may shave face and neck.  Do not bring valuables to the hospital.  [facility] is not responsible for any belongings or valuables.  Contacts, dentures or bridgework may not be worn into surgery.  Leave your suitcase in the car.  After surgery it may be brought to your room.  For patients admitted to the hospital, discharge time will be determined by your treatment team.  Patients discharged the day of surgery will not be allowed to drive home.    Special instructions:  [facility]- Preparing For Surgery  Before surgery, you can play an important role. Because skin is not sterile, your skin needs to be as free of germs as possible. You can reduce the number of germs on your skin by washing with CHG (chlorahexidine gluconate) Soap before surgery.  CHG is an antiseptic cleaner which kills germs and bonds with the skin to continue killing germs even after washing.  Please do not use if you have an allergy to CHG or antibacterial soaps. If your skin becomes reddened/irritated stop using the CHG.  Do not shave (including legs and underarms) for at least 48 hours prior to first CHG  shower. It is OK to shave your face.  Please follow these instructions carefully.   1. Shower the NIGHT BEFORE SURGERY and the MORNING OF SURGERY with CHG.   2. If you chose to wash your hair, wash your hair first as usual with your normal shampoo.  3. After you shampoo, rinse your hair and body thoroughly to remove the shampoo.  4. Use CHG as you would any other liquid soap. You can apply CHG directly to the skin and wash gently with a scrungie or a clean washcloth.   5. Apply the CHG Soap to your body ONLY FROM THE NECK DOWN.  Do not use on open wounds or open sores. Avoid contact with your eyes, ears, mouth and genitals (private parts). Wash genitals (private parts) with your normal soap.  6. Wash thoroughly, paying special attention to the area where your surgery will be performed.  7. Thoroughly rinse your body with warm water from the neck down.  8. DO NOT shower/wash with your normal soap after using and rinsing off the CHG Soap.  9. Pat yourself dry with a CLEAN TOWEL.   10. Wear CLEAN PAJAMAS   11. Place CLEAN SHEETS on your bed the night of your first shower and DO NOT SLEEP WITH PETS.    Day of Surgery: Do not apply any deodorants/lotions. Please wear clean clothes to the  hospital/surgery center.      Please read over the following fact sheets that you were given. Coughing and Deep Breathing, MRSA Information and Surgical Site Infection Prevention

## 2016-06-01 NOTE — Progress Notes (Addendum)
PCP is Dr. Torrie MayersE Saguier at [facility]. Denies any heart issues, has never seen a cardio.

## 2016-06-09 ENCOUNTER — Other Ambulatory Visit: Payer: Self-pay

## 2016-06-09 MED ORDER — DICLOFENAC SODIUM 75 MG PO TBEC
75.0000 mg | DELAYED_RELEASE_TABLET | Freq: Two times a day (BID) | ORAL | 0 refills | Status: DC
Start: 2016-06-09 — End: 2016-09-13

## 2016-06-09 MED ORDER — DEXTROSE 5 % IV SOLN
3.0000 g | INTRAVENOUS | Status: AC
  Administered 2016-06-12: 3 g via INTRAVENOUS
  Filled 2016-06-09 (×2): qty 3000

## 2016-06-12 ENCOUNTER — Ambulatory Visit (HOSPITAL_COMMUNITY): Payer: Managed Care, Other (non HMO) | Admitting: Anesthesiology

## 2016-06-12 ENCOUNTER — Encounter (HOSPITAL_COMMUNITY): Payer: Self-pay | Admitting: *Deleted

## 2016-06-12 ENCOUNTER — Encounter (HOSPITAL_COMMUNITY)
Admission: RE | Disposition: A | Discharge status: Home / Self Care | Payer: Self-pay | Source: Ambulatory Visit | Attending: Neurological Surgery

## 2016-06-12 ENCOUNTER — Inpatient Hospital Stay (HOSPITAL_COMMUNITY)
Admission: RE | Admit: 2016-06-12 | Discharge: 2016-06-12 | DRG: 520 | Disposition: A | Discharge status: Home / Self Care | Payer: Managed Care, Other (non HMO) | Source: Ambulatory Visit | Attending: Neurological Surgery | Admitting: Neurological Surgery

## 2016-06-12 ENCOUNTER — Ambulatory Visit (HOSPITAL_COMMUNITY): Payer: Managed Care, Other (non HMO)

## 2016-06-12 DIAGNOSIS — M4802 Spinal stenosis, cervical region: Secondary | ICD-10-CM | POA: Diagnosis present

## 2016-06-12 DIAGNOSIS — M4712 Other spondylosis with myelopathy, cervical region: Secondary | ICD-10-CM | POA: Diagnosis present

## 2016-06-12 DIAGNOSIS — F17210 Nicotine dependence, cigarettes, uncomplicated: Secondary | ICD-10-CM | POA: Diagnosis present

## 2016-06-12 DIAGNOSIS — Z9889 Other specified postprocedural states: Secondary | ICD-10-CM

## 2016-06-12 DIAGNOSIS — G959 Disease of spinal cord, unspecified: Secondary | ICD-10-CM | POA: Diagnosis present

## 2016-06-12 HISTORY — PX: POSTERIOR CERVICAL LAMINECTOMY: SHX2248

## 2016-06-12 SURGERY — POSTERIOR CERVICAL LAMINECTOMY
Anesthesia: General | Site: Spine Cervical

## 2016-06-12 MED ORDER — PHENOL 1.4 % MT LIQD: 1.0000 | OROMUCOSAL | Status: DC | PRN

## 2016-06-12 MED ORDER — CYCLOBENZAPRINE HCL 10 MG PO TABS
10.0000 mg | ORAL_TABLET | Freq: Three times a day (TID) | ORAL | Status: DC | PRN
  Administered 2016-06-12: 10 mg via ORAL
  Filled 2016-06-12: qty 1

## 2016-06-12 MED ORDER — ONDANSETRON HCL 4 MG PO TABS: 4.0000 mg | ORAL_TABLET | Freq: Four times a day (QID) | ORAL | Status: DC | PRN

## 2016-06-12 MED ORDER — LIDOCAINE 2% (20 MG/ML) 5 ML SYRINGE
INTRAMUSCULAR | Status: AC
  Filled 2016-06-12: qty 10

## 2016-06-12 MED ORDER — LIDOCAINE-EPINEPHRINE 1 %-1:100000 IJ SOLN
INTRAMUSCULAR | Status: DC | PRN
  Administered 2016-06-12: 5 mL

## 2016-06-12 MED ORDER — TETRAHYDROZOLINE HCL 0.05 % OP SOLN: 1.0000 [drp] | Freq: Three times a day (TID) | OPHTHALMIC | Status: DC | PRN

## 2016-06-12 MED ORDER — ONDANSETRON HCL 4 MG/2ML IJ SOLN
INTRAMUSCULAR | Status: AC
  Filled 2016-06-12: qty 2

## 2016-06-12 MED ORDER — LIDOCAINE-EPINEPHRINE 1 %-1:100000 IJ SOLN
INTRAMUSCULAR | Status: AC
  Filled 2016-06-12: qty 1

## 2016-06-12 MED ORDER — SUCCINYLCHOLINE CHLORIDE 200 MG/10ML IV SOSY
PREFILLED_SYRINGE | INTRAVENOUS | Status: AC
  Filled 2016-06-12: qty 10

## 2016-06-12 MED ORDER — DEXAMETHASONE SODIUM PHOSPHATE 10 MG/ML IJ SOLN
INTRAMUSCULAR | Status: AC
  Filled 2016-06-12: qty 1

## 2016-06-12 MED ORDER — ONDANSETRON HCL 4 MG/2ML IJ SOLN: 4.0000 mg | Freq: Four times a day (QID) | INTRAMUSCULAR | Status: DC | PRN

## 2016-06-12 MED ORDER — OXYCODONE-ACETAMINOPHEN 5-325 MG PO TABS
2.0000 | ORAL_TABLET | ORAL | Status: DC | PRN
  Administered 2016-06-12 (×3): 2 via ORAL
  Filled 2016-06-12 (×3): qty 2

## 2016-06-12 MED ORDER — HYDROCODONE-ACETAMINOPHEN 5-325 MG PO TABS: 1.0000 | ORAL_TABLET | ORAL | Status: DC | PRN

## 2016-06-12 MED ORDER — SUGAMMADEX SODIUM 200 MG/2ML IV SOLN
INTRAVENOUS | Status: AC
  Filled 2016-06-12: qty 2

## 2016-06-12 MED ORDER — ARTIFICIAL TEARS OPHTHALMIC OINT
TOPICAL_OINTMENT | OPHTHALMIC | Status: AC
  Filled 2016-06-12: qty 3.5

## 2016-06-12 MED ORDER — SODIUM CHLORIDE 0.9 % IR SOLN
Status: DC | PRN
  Administered 2016-06-12: 500 mL

## 2016-06-12 MED ORDER — SODIUM CHLORIDE 0.9 % IV SOLN: 250.0000 mL | INTRAVENOUS | Status: DC

## 2016-06-12 MED ORDER — PHENYLEPHRINE 40 MCG/ML (10ML) SYRINGE FOR IV PUSH (FOR BLOOD PRESSURE SUPPORT)
PREFILLED_SYRINGE | INTRAVENOUS | Status: AC
  Filled 2016-06-12: qty 10

## 2016-06-12 MED ORDER — HEMOSTATIC AGENTS (NO CHARGE) OPTIME
TOPICAL | Status: DC | PRN
  Administered 2016-06-12: 1 via TOPICAL

## 2016-06-12 MED ORDER — BUPIVACAINE HCL (PF) 0.25 % IJ SOLN
INTRAMUSCULAR | Status: AC
  Filled 2016-06-12: qty 30

## 2016-06-12 MED ORDER — DEXAMETHASONE SODIUM PHOSPHATE 10 MG/ML IJ SOLN
INTRAMUSCULAR | Status: DC | PRN
  Administered 2016-06-12: 10 mg via INTRAVENOUS

## 2016-06-12 MED ORDER — 0.9 % SODIUM CHLORIDE (POUR BTL) OPTIME
TOPICAL | Status: DC | PRN
  Administered 2016-06-12: 1000 mL

## 2016-06-12 MED ORDER — MIDAZOLAM HCL 2 MG/2ML IJ SOLN
INTRAMUSCULAR | Status: AC
  Filled 2016-06-12: qty 2

## 2016-06-12 MED ORDER — HYDROMORPHONE HCL 1 MG/ML IJ SOLN: 0.5000 mg | INTRAMUSCULAR | Status: DC | PRN

## 2016-06-12 MED ORDER — LACTATED RINGERS IV SOLN
INTRAVENOUS | Status: DC | PRN
  Administered 2016-06-12: 07:00:00 via INTRAVENOUS

## 2016-06-12 MED ORDER — LIDOCAINE HCL (CARDIAC) 20 MG/ML IV SOLN
INTRAVENOUS | Status: DC | PRN
  Administered 2016-06-12: 100 mg via INTRAVENOUS

## 2016-06-12 MED ORDER — FLEET ENEMA 7-19 GM/118ML RE ENEM: 1.0000 | ENEMA | Freq: Once | RECTAL | Status: DC | PRN

## 2016-06-12 MED ORDER — SODIUM CHLORIDE 0.9% FLUSH: 3.0000 mL | Freq: Two times a day (BID) | INTRAVENOUS | Status: DC

## 2016-06-12 MED ORDER — BUPIVACAINE HCL (PF) 0.5 % IJ SOLN
INTRAMUSCULAR | Status: DC | PRN
  Administered 2016-06-12: 5 mL
  Administered 2016-06-12: 20 mL

## 2016-06-12 MED ORDER — OXYCODONE-ACETAMINOPHEN 5-325 MG PO TABS: 2.0000 | ORAL_TABLET | ORAL | 0 refills | Status: DC | PRN

## 2016-06-12 MED ORDER — CYCLOBENZAPRINE HCL 10 MG PO TABS: 10.0000 mg | ORAL_TABLET | Freq: Four times a day (QID) | ORAL | Status: DC | PRN

## 2016-06-12 MED ORDER — ACETAMINOPHEN 650 MG RE SUPP: 650.0000 mg | RECTAL | Status: DC | PRN

## 2016-06-12 MED ORDER — HYDROMORPHONE HCL 1 MG/ML IJ SOLN: 0.2500 mg | INTRAMUSCULAR | Status: DC | PRN

## 2016-06-12 MED ORDER — CHLORHEXIDINE GLUCONATE CLOTH 2 % EX PADS: 6.0000 | MEDICATED_PAD | Freq: Once | CUTANEOUS | Status: DC

## 2016-06-12 MED ORDER — BACITRACIN ZINC 500 UNIT/GM EX OINT
TOPICAL_OINTMENT | CUTANEOUS | Status: AC
  Filled 2016-06-12: qty 28.35

## 2016-06-12 MED ORDER — THROMBIN 5000 UNITS EX SOLR
CUTANEOUS | Status: AC
  Filled 2016-06-12: qty 5000

## 2016-06-12 MED ORDER — THROMBIN 5000 UNITS EX SOLR
OROMUCOSAL | Status: DC | PRN
  Administered 2016-06-12: 5 mL via TOPICAL

## 2016-06-12 MED ORDER — TRAMADOL HCL 50 MG PO TABS: 50.0000 mg | ORAL_TABLET | Freq: Four times a day (QID) | ORAL | Status: DC | PRN

## 2016-06-12 MED ORDER — MIDAZOLAM HCL 5 MG/5ML IJ SOLN
INTRAMUSCULAR | Status: DC | PRN
  Administered 2016-06-12: 2 mg via INTRAVENOUS

## 2016-06-12 MED ORDER — CEFAZOLIN SODIUM-DEXTROSE 2-4 GM/100ML-% IV SOLN
2.0000 g | Freq: Three times a day (TID) | INTRAVENOUS | Status: DC
  Administered 2016-06-12: 2 g via INTRAVENOUS
  Filled 2016-06-12: qty 100

## 2016-06-12 MED ORDER — ACETAMINOPHEN 325 MG PO TABS: 650.0000 mg | ORAL_TABLET | ORAL | Status: DC | PRN

## 2016-06-12 MED ORDER — SODIUM CHLORIDE 0.9% FLUSH: 3.0000 mL | INTRAVENOUS | Status: DC | PRN

## 2016-06-12 MED ORDER — BACITRACIN ZINC 500 UNIT/GM EX OINT
TOPICAL_OINTMENT | CUTANEOUS | Status: DC | PRN
  Administered 2016-06-12: 1 via TOPICAL

## 2016-06-12 MED ORDER — THROMBIN 5000 UNITS EX SOLR
CUTANEOUS | Status: DC | PRN
  Administered 2016-06-12 (×2): 5000 [IU] via TOPICAL

## 2016-06-12 MED ORDER — DOCUSATE SODIUM 100 MG PO CAPS: 100.0000 mg | ORAL_CAPSULE | Freq: Two times a day (BID) | ORAL | Status: DC

## 2016-06-12 MED ORDER — DIAZEPAM 5 MG PO TABS: 5.0000 mg | ORAL_TABLET | Freq: Four times a day (QID) | ORAL | 0 refills | Status: DC | PRN

## 2016-06-12 MED ORDER — POLYETHYLENE GLYCOL 3350 17 G PO PACK: 17.0000 g | PACK | Freq: Every day | ORAL | Status: DC | PRN

## 2016-06-12 MED ORDER — PROMETHAZINE HCL 25 MG/ML IJ SOLN: 6.2500 mg | INTRAMUSCULAR | Status: DC | PRN

## 2016-06-12 MED ORDER — MENTHOL 3 MG MT LOZG: 1.0000 | LOZENGE | OROMUCOSAL | Status: DC | PRN

## 2016-06-12 MED ORDER — FENTANYL CITRATE (PF) 100 MCG/2ML IJ SOLN
INTRAMUSCULAR | Status: DC | PRN
  Administered 2016-06-12: 150 ug via INTRAVENOUS
  Administered 2016-06-12: 100 ug via INTRAVENOUS

## 2016-06-12 MED ORDER — BISACODYL 10 MG RE SUPP: 10.0000 mg | Freq: Every day | RECTAL | Status: DC | PRN

## 2016-06-12 MED ORDER — ALUM & MAG HYDROXIDE-SIMETH 200-200-20 MG/5ML PO SUSP: 30.0000 mL | Freq: Four times a day (QID) | ORAL | Status: DC | PRN

## 2016-06-12 MED ORDER — PROPOFOL 10 MG/ML IV BOLUS
INTRAVENOUS | Status: AC
  Filled 2016-06-12: qty 40

## 2016-06-12 MED ORDER — SENNA 8.6 MG PO TABS: 1.0000 | ORAL_TABLET | Freq: Two times a day (BID) | ORAL | Status: DC

## 2016-06-12 MED ORDER — PROPOFOL 10 MG/ML IV BOLUS
INTRAVENOUS | Status: DC | PRN
  Administered 2016-06-12: 50 mg via INTRAVENOUS
  Administered 2016-06-12: 200 mg via INTRAVENOUS

## 2016-06-12 MED ORDER — FENTANYL CITRATE (PF) 250 MCG/5ML IJ SOLN
INTRAMUSCULAR | Status: AC
  Filled 2016-06-12: qty 5

## 2016-06-12 MED ORDER — THROMBIN 5000 UNITS EX SOLR
CUTANEOUS | Status: AC
  Filled 2016-06-12: qty 10000

## 2016-06-12 MED ORDER — PANTOPRAZOLE SODIUM 40 MG PO TBEC: 40.0000 mg | DELAYED_RELEASE_TABLET | Freq: Every day | ORAL | Status: DC

## 2016-06-12 MED ORDER — ROCURONIUM BROMIDE 100 MG/10ML IV SOLN
INTRAVENOUS | Status: DC | PRN
  Administered 2016-06-12 (×2): 50 mg via INTRAVENOUS

## 2016-06-12 MED ORDER — SUGAMMADEX SODIUM 200 MG/2ML IV SOLN
INTRAVENOUS | Status: DC | PRN
  Administered 2016-06-12: 400 mg via INTRAVENOUS

## 2016-06-12 MED ORDER — EPHEDRINE 5 MG/ML INJ
INTRAVENOUS | Status: AC
  Filled 2016-06-12: qty 10

## 2016-06-12 MED ORDER — ONDANSETRON HCL 4 MG/2ML IJ SOLN
INTRAMUSCULAR | Status: DC | PRN
  Administered 2016-06-12: 4 mg via INTRAVENOUS

## 2016-06-12 MED ORDER — ROCURONIUM BROMIDE 10 MG/ML (PF) SYRINGE
PREFILLED_SYRINGE | INTRAVENOUS | Status: AC
  Filled 2016-06-12: qty 5

## 2016-06-12 SURGICAL SUPPLY — 61 items
BAG DECANTER FOR FLEXI CONT (MISCELLANEOUS) ×3 IMPLANT
BENZOIN TINCTURE PRP APPL 2/3 (GAUZE/BANDAGES/DRESSINGS) IMPLANT
BIT DRILL NEURO 2X3.1 SFT TUCH (MISCELLANEOUS) ×1 IMPLANT
BLADE CLIPPER SURG (BLADE) ×3 IMPLANT
BLADE SURG 11 STRL SS (BLADE) ×3 IMPLANT
CANISTER SUCT 3000ML PPV (MISCELLANEOUS) ×3 IMPLANT
CARTRIDGE OIL MAESTRO DRILL (MISCELLANEOUS) IMPLANT
CLOSURE WOUND 1/2 X4 (GAUZE/BANDAGES/DRESSINGS)
COVER MAYO STAND STRL (DRAPES) IMPLANT
DECANTER SPIKE VIAL GLASS SM (MISCELLANEOUS) ×3 IMPLANT
DERMABOND ADVANCED (GAUZE/BANDAGES/DRESSINGS) ×2
DERMABOND ADVANCED .7 DNX12 (GAUZE/BANDAGES/DRESSINGS) ×1 IMPLANT
DIFFUSER DRILL AIR PNEUMATIC (MISCELLANEOUS) IMPLANT
DRAPE LAPAROTOMY 100X72 PEDS (DRAPES) ×3 IMPLANT
DRAPE POUCH INSTRU U-SHP 10X18 (DRAPES) ×3 IMPLANT
DRILL NEURO 2X3.1 SOFT TOUCH (MISCELLANEOUS) ×3
DURAPREP 26ML APPLICATOR (WOUND CARE) ×3 IMPLANT
ELECT BLADE 6.5 EXT (BLADE) IMPLANT
ELECT REM PT RETURN 9FT ADLT (ELECTROSURGICAL) ×3
ELECTRODE REM PT RTRN 9FT ADLT (ELECTROSURGICAL) ×1 IMPLANT
GAUZE SPONGE 4X4 12PLY STRL (GAUZE/BANDAGES/DRESSINGS) IMPLANT
GAUZE SPONGE 4X4 16PLY XRAY LF (GAUZE/BANDAGES/DRESSINGS) IMPLANT
GLOVE BIO SURGEON STRL SZ7.5 (GLOVE) IMPLANT
GLOVE BIOGEL PI IND STRL 7.5 (GLOVE) IMPLANT
GLOVE BIOGEL PI IND STRL 8.5 (GLOVE) ×1 IMPLANT
GLOVE BIOGEL PI INDICATOR 7.5 (GLOVE)
GLOVE BIOGEL PI INDICATOR 8.5 (GLOVE) ×2
GLOVE ECLIPSE 8.5 STRL (GLOVE) ×3 IMPLANT
GLOVE EXAM NITRILE LRG STRL (GLOVE) IMPLANT
GLOVE EXAM NITRILE XL STR (GLOVE) IMPLANT
GLOVE EXAM NITRILE XS STR PU (GLOVE) IMPLANT
GLOVE SURG SS PI 7.5 STRL IVOR (GLOVE) ×6 IMPLANT
GOWN STRL REUS W/ TWL LRG LVL3 (GOWN DISPOSABLE) ×1 IMPLANT
GOWN STRL REUS W/ TWL XL LVL3 (GOWN DISPOSABLE) IMPLANT
GOWN STRL REUS W/TWL 2XL LVL3 (GOWN DISPOSABLE) ×3 IMPLANT
GOWN STRL REUS W/TWL LRG LVL3 (GOWN DISPOSABLE) ×2
GOWN STRL REUS W/TWL XL LVL3 (GOWN DISPOSABLE)
HEMOSTAT POWDER SURGIFOAM 1G (HEMOSTASIS) ×3 IMPLANT
HEMOSTAT SURGICEL 2X14 (HEMOSTASIS) IMPLANT
KIT BASIN OR (CUSTOM PROCEDURE TRAY) ×3 IMPLANT
KIT ROOM TURNOVER OR (KITS) ×3 IMPLANT
NEEDLE HYPO 18GX1.5 BLUNT FILL (NEEDLE) IMPLANT
NEEDLE HYPO 22GX1.5 SAFETY (NEEDLE) ×3 IMPLANT
NS IRRIG 1000ML POUR BTL (IV SOLUTION) ×3 IMPLANT
OIL CARTRIDGE MAESTRO DRILL (MISCELLANEOUS)
PACK LAMINECTOMY NEURO (CUSTOM PROCEDURE TRAY) ×3 IMPLANT
PAD ARMBOARD 7.5X6 YLW CONV (MISCELLANEOUS) ×24 IMPLANT
SPONGE LAP 4X18 X RAY DECT (DISPOSABLE) IMPLANT
SPONGE SURGIFOAM ABS GEL SZ50 (HEMOSTASIS) ×3 IMPLANT
STAPLER SKIN PROX WIDE 3.9 (STAPLE) IMPLANT
STRIP CLOSURE SKIN 1/2X4 (GAUZE/BANDAGES/DRESSINGS) IMPLANT
SUT ETHILON 3 0 FSL (SUTURE) IMPLANT
SUT VIC AB 1 CT1 18XBRD ANBCTR (SUTURE) ×1 IMPLANT
SUT VIC AB 1 CT1 8-18 (SUTURE) ×2
SUT VIC AB 2-0 CP2 18 (SUTURE) ×3 IMPLANT
SUT VIC AB 3-0 SH 8-18 (SUTURE) ×6 IMPLANT
SYR 3ML LL SCALE MARK (SYRINGE) IMPLANT
TOWEL GREEN STERILE (TOWEL DISPOSABLE) ×3 IMPLANT
TOWEL GREEN STERILE FF (TOWEL DISPOSABLE) ×3 IMPLANT
TRAY FOLEY W/METER SILVER 16FR (SET/KITS/TRAYS/PACK) IMPLANT
WATER STERILE IRR 1000ML POUR (IV SOLUTION) ×3 IMPLANT

## 2016-06-12 NOTE — H&P (Signed)
CHIEF COMPLAINT:                                          Neck pain, back pain, numbness from the arm to the hands and the legs.   HISTORY OF PRESENT ILLNESS:                     Louis [name] is a 41 year old right-handed individual who tells me that back in 04/2015 he was just brushing his hair, and he twisted in a certain way and noted that he had a significant pain with numbness into the arm and hands. This was mostly on the left side. It recurred and persisted, and ultimately he was seen by his primary care specialist, and an MRI was ordered. He was then seen by the specialist at [facility], and they notified him that he had an unstable spine and suggested that he have imminent surgery to stabilize C1 and C2. He was put off by this, and in July he had another opinion by Dr. Leeanne Rio'Gara at [facility]. There, Dr. Leeanne Rio'Gara repeated MRIs of the neck with an MRI in flexion and an MRI in neutral position. Dr. Leeanne Rio'Gara concurred and suggested that Louis [name] needs a C1-2 fusion. He was again put off by this. At a third opinion at [facility] [place] was asked by the surgeon as to what he wanted to have done. He was confused by the uncertainty of that person, and he has been tolerating the pain using some pain medication in the form of tramadol and trying to be cautious with his activity. He sought this opinion in an effort to try to get some clarity about what his situation actually involves.  DIAGNOSTIC STUDIES:                                    Today in the office, I reviewed his initial MRI from 05/2015 and the [facility]. I demonstrated the findings to him, particularly the instability of the ring on C1 on C2 with what appears to be some significant stenosis of C1 on C2, particularly on the flexion MRI. There are no intrinsic cord signal changes at that level, and this is very thankful. Nonetheless, as Louis [name] sits here, he complains that his left hand is numb. I  noted to Louis [name] that he does have some disc degenerative changes on the right side at the C3-4 level. But I believe the overwhelming problem occurs at the C1-2 interval.     REVIEW OF SYSTEMS:                                    Notable for balance disturbance, swelling in his feet and his hands, neck pain, arm weakness, back pain are all noted on a 14-Point Review Sheet.   PAST MEDICAL HISTORY:                                Reveals that his general health has been good. He reports no significant medical problems.   . Prior Operations: His only surgery has been Achilles heel repair on 01/2012.   .Marland Kitchen  Medications and Allergies: He is currently taking Tramadol 50 twice a day, Diclofenac 75 mg twice a day, and Flexeril twice a day for muscle spasms and stiffness. He notes no allergies to any medications.   NEUROLOGICAL EXAMINATION:                       On examination, I note that his strength, tone, and bulk are good in his upper extremities. He does not appear to have any focal deficits.   IMPRESSION/PLAN:                                         I reviewed the imaging studies with the patient and his wife and noted the difficulties and instability that he has at C1-2. I agree that a fusion at C1-2 would gain stability of that joint without the need for any decompression. However, I suspect he has had some degree of instability at C1-2 for a long period of time. In an effort, to help control the pain and decompress his spinal canal, I have suggested that we do a laminectomy of the C1 ring to make more room in the back of his canal. If this is successful, it would preserve the motion at C1-2 and yet prevent him from ultimately stiffening his neck. Then, we can simply observe C1-2 with yearly interval x-rays to see if more looseness is occurring. If this condition remains stable, it is possible that we could watch this for many years without any worsening. Having discussed this with him and his wife, I indicated  that the surgery to decompress the ring of C1 is relatively small compared to a fusion and typically would involve only an overnight stay in the hospital. His neck would have some limitation of motion while he healed for the first 6-8 weeks, but thereafter his mobility should return to normal. I noted that usually after this type of surgery we will only have patients wear a soft collar for a period of three or four weeks until the initial wound healing occurs, and hopefully within about four weeks he could resume more normal activities. He is admitted for surgery.

## 2016-06-12 NOTE — Anesthesia Preprocedure Evaluation (Signed)
Anesthesia Evaluation  Patient identified by MRN, date of birth, ID band Patient awake    Reviewed: Allergy & Precautions, NPO status , Patient's Chart, lab work & pertinent test results  Airway Mallampati: III  TM Distance: <3 FB Neck ROM: Full    Dental no notable dental hx.    Pulmonary neg pulmonary ROS, Current Smoker,    Pulmonary exam normal breath sounds clear to auscultation       Cardiovascular negative cardio ROS Normal cardiovascular exam Rhythm:Regular Rate:Normal     Neuro/Psych negative neurological ROS  negative psych ROS   GI/Hepatic negative GI ROS, Neg liver ROS,   Endo/Other  Morbid obesity  Renal/GU negative Renal ROS  negative genitourinary   Musculoskeletal negative musculoskeletal ROS (+)   Abdominal   Peds negative pediatric ROS (+)  Hematology negative hematology ROS (+)   Anesthesia Other Findings   Reproductive/Obstetrics negative OB ROS                             Anesthesia Physical Anesthesia Plan  ASA: III  Anesthesia Plan: General   Post-op Pain Management:    Induction: Intravenous  PONV Risk Score and Plan: 2 and Ondansetron and Dexamethasone  Airway Management Planned: Video Laryngoscope Planned  Additional Equipment:   Intra-op Plan:   Post-operative Plan: Extubation in OR  Informed Consent: I have reviewed the patients History and Physical, chart, labs and discussed the procedure including the risks, benefits and alternatives for the proposed anesthesia with the patient or authorized representative who has indicated his/her understanding and acceptance.   Dental advisory given  Plan Discussed with: CRNA and Surgeon  Anesthesia Plan Comments:         Anesthesia Quick Evaluation

## 2016-06-12 NOTE — Progress Notes (Signed)
OT Cancellation Note  Patient Details Name: Louis [name] MRN: [id number] DOB: 1975-11-22   Cancelled Treatment:    Reason Eval/Treat Not Completed: OT screened, no needs identified, will sign off. Pt with no OT needs at this time. Able to complete ADL with supervision from wife. No equipment needs identified and pt with good understanding of back precautions from PT session. OT will sign off.   Louis Sectionharity A Serina Nichter, MS OTR/L  Pager: [phone]   Louis SectionCharity A Chantee [name] 06/12/2016, 3:23 PM

## 2016-06-12 NOTE — Evaluation (Signed)
Physical Therapy Evaluation and Discharge Patient Details Name: Louis [name] Damon Lemus MRN: [id number] DOB: July 29, 1975 Today's Date: 06/12/2016   History of Present Illness  Pt is a 41 y/o male s/p C1 laminectomy and decrompression of ring. PMH of meniscus tear and bilat ACL repair.   Clinical Impression  Patient evaluated by Physical Therapy with no further acute PT needs identified. All education has been completed and the patient has no further questions. Pt at supervision level for ambulation and min guard for stair navigation. Will have necessary assist at home and education for assist completed. Pt requiring no follow up and no DME. See below for any follow-up Physical Therapy or equipment needs. PT is signing off. Thank you for this referral. If needs change, please re-consult.      Follow Up Recommendations No PT follow up;Supervision - Intermittent    Equipment Recommendations  None recommended by PT    Recommendations for Other Services       Precautions / Restrictions Precautions Precautions: Cervical Precaution Comments: Reviewed cervical precautions with pt.  Required Braces or Orthoses: Cervical Brace Cervical Brace: Soft collar Restrictions Weight Bearing Restrictions: No      Mobility  Bed Mobility Overal bed mobility: Needs Assistance Bed Mobility: Supine to Sit;Sit to Supine     Supine to sit: Supervision Sit to supine: Supervision   General bed mobility comments: Supervision for safety. Verbal cues for use of log roll technique.   Transfers Overall transfer level: Needs assistance Equipment used: None Transfers: Sit to/from Stand Sit to Stand: Supervision         General transfer comment: Supervision for safety   Ambulation/Gait Ambulation/Gait assistance: Supervision Ambulation Distance (Feet): 300 Feet Assistive device: None Gait Pattern/deviations: Step-through pattern;Decreased stride length;Wide base of support Gait velocity:  Decreased Gait velocity interpretation: Below normal speed for age/gender General Gait Details: Slow, guarded gait, but overall steady. Reports bilat knee pain, however, reports this is present at baseline. No LOB noted.   Stairs Stairs: Yes Stairs assistance: Min guard Stair Management: One rail Left;Step to pattern;Forwards (HHA) Number of Stairs: 2 General stair comments: Educated about use of HHA for increasing safety during mobility. Overall safe technique, min guard for safety.   Wheelchair Mobility    Modified Rankin (Stroke Patients Only)       Balance Overall balance assessment: Needs assistance Sitting-balance support: No upper extremity supported;Feet supported Sitting balance-Leahy Scale: Good     Standing balance support: No upper extremity supported;During functional activity Standing balance-Leahy Scale: Good Standing balance comment: steady gait with no external support                             Pertinent Vitals/Pain Pain Assessment: 0-10 Pain Score: 8  Pain Location: Neck  Pain Descriptors / Indicators: Aching;Operative site guarding;Sore Pain Intervention(s): Monitored during session;Limited activity within patient's tolerance;Repositioned;Patient requesting pain meds-RN notified    Home Living Family/patient expects to be discharged to:: Private residence Living Arrangements: Spouse/significant other;Children Available Help at Discharge: Family;Available 24 hours/day Type of Home: House Home Access: Stairs to enter Entrance Stairs-Rails: None Entrance Stairs-Number of Steps: 2 Home Layout: One level Home Equipment: None      Prior Function Level of Independence: Independent               Hand Dominance        Extremity/Trunk Assessment   Upper Extremity Assessment Upper Extremity Assessment: Overall WFL for tasks assessed  Lower Extremity Assessment Lower Extremity Assessment: Overall WFL for tasks assessed     Cervical / Trunk Assessment Cervical / Trunk Assessment: Other exceptions Cervical / Trunk Exceptions: s/p surgery   Communication   Communication: No difficulties  Cognition Arousal/Alertness: Awake/alert Behavior During Therapy: WFL for tasks assessed/performed Overall Cognitive Status: Within Functional Limits for tasks assessed                                        General Comments General comments (skin integrity, edema, etc.): Educated about generalized walking program to perform at home. Completed all education.     Exercises     Assessment/Plan    PT Assessment Patent does not need any further PT services  PT Problem List         PT Treatment Interventions      PT Goals (Current goals can be found in the Care Plan section)  Acute Rehab PT Goals Patient Stated Goal: to go home  PT Goal Formulation: With patient Time For Goal Achievement: 06/12/16 Potential to Achieve Goals: Good    Frequency     Barriers to discharge        Co-evaluation               AM-PAC PT "6 Clicks" Daily Activity  Outcome Measure Difficulty turning over in bed (including adjusting bedclothes, sheets and blankets)?: A Little Difficulty moving from lying on back to sitting on the side of the bed? : A Little Difficulty sitting down on and standing up from a chair with arms (e.g., wheelchair, bedside commode, etc,.)?: A Little Help needed moving to and from a bed to chair (including a wheelchair)?: None Help needed walking in hospital room?: None Help needed climbing 3-5 steps with a railing? : A Little 6 Click Score: 20    End of Session Equipment Utilized During Treatment: Gait belt;Cervical collar Activity Tolerance: Patient tolerated treatment well Patient left: in bed;with call bell/phone within reach;with family/visitor present Nurse Communication: Mobility status;Patient requests pain meds PT Visit Diagnosis: Other abnormalities of gait and mobility  (R26.89);Other symptoms and signs involving the nervous system (R29.898)    Time: 1610-9604 PT Time Calculation (min) (ACUTE ONLY): 20 min   Charges:   PT Evaluation $PT Eval Low Complexity: 1 Procedure PT Treatments $Gait Training: 8-22 mins   PT G Codes:        Margot Chimes, PT, DPT  Acute Rehabilitation Services  Pager: [phone]   Melvyn Novas 06/12/2016, 2:57 PM

## 2016-06-12 NOTE — Op Note (Signed)
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 endotracheal Indications: Louis [name] is a [age] year old male who has had symptoms of numbness and dysesthesias in the upper extremities more recently started developing some difficulties with walking and some lower extremity discomfort he underwent a workup including an MRI of the cervical spine demonstrated patient had a significant fixed defect with anterior ring of C1 displaced by approximately 4 mm this causes significant cervical stenosis with early signs of compression of the spinal cord. Flexion-extension revealed no other gross instability at C1-C2 level. After careful consideration of his options I advised regarding simple decompression via removal of the posterior ring of C1. He is now taken to the operating room for this procedure.  Procedure: Patient is brought to the operating room supine on a stretcher. After the smooth induction of general endotracheal anesthesia is placed in a 3 pin head rest. He was then carefully turned to the prone position. Patient has morbid obesity and is approximately 400 pounds in weight. This required special padding to be placed around various bony prominences. Once he was secured the back of the neck was prepped with alcohol and DuraPrep and draped in a sterile fashion. A midline incision was created high in the cervical spine and carried down to the cervical dorsal fascia. Fascia was opened on either side of midline and through meticulous dissection we exposed during of C1. The ring was then exposed fully out to the lateral tuberosities. Once it was secured initially a Kerrison punch was used with a Leksell rongeur was used to remove the posterior arch of C1 high-speed drill was used to bur out carefully to the lateral  aspect of the posterior aspect the canal. Once this was secured hemostasis was secured and the bony edges and in the soft tissues. Area was inspected carefully and irrigated copiously. 2-0 Vicryl was used to close the cervical dorsal fascia 3-0 Vicryl was used to close the subcutaneous take her skin and Dermabond was placed on the skin. Patient was removed from the 3 pin headrest and placed back on 2 a stretcher in the supine position. Blood loss is estimated at less than 75 mL.

## 2016-06-12 NOTE — Transfer of Care (Signed)
Immediate Anesthesia Transfer of Care Note  Patient: Louis [name]  Procedure(s) Performed: Procedure(s) with comments: Cervical One Posterior cervical laminectomy (N/A) - posterior  Patient Location: PACU  Anesthesia Type:General  Level of Consciousness: awake, sedated, drowsy and patient cooperative  Airway & Oxygen Therapy: Patient Spontanous Breathing and Patient connected to nasal cannula oxygen  Post-op Assessment: Report given to RN, Post -op Vital signs reviewed and stable, Patient moving all extremities and Patient moving all extremities X 4  Post vital signs: Reviewed and stable  Last Vitals:  Vitals:   06/12/16 0626 06/12/16 0930  BP: 123/78   Pulse: 78   Resp: 18   Temp: 36.7 C (P) 36.3 C    Last Pain:  Vitals:   06/12/16 0626  TempSrc: Oral  PainSc:          Complications: No apparent anesthesia complications

## 2016-06-12 NOTE — Progress Notes (Signed)
Patient ID: [name], male   DOB: Dec 15, 1975, 41 y.o.   MRN: [id number] Patient has ambulated and appears to be doing well. Mobilizing  Discharge this pm

## 2016-06-12 NOTE — Discharge Summary (Signed)
Physician Discharge Summary  Patient ID: Louis [name] MRN: [id number] DOB/AGE: Jul 04, 1975 41 y.o.  Admit date: 06/12/2016 Discharge date: 06/12/2016  Admission Diagnoses:Cervical stenosis with myelopathy C1. Morbid obesity  Discharge Diagnoses: Cervical stenosis with myelopathy C1. Morbid obesity Active Problems:   Status post surgery   Cervical myelopathy [facility](HCC)   Discharged Condition: good  Hospital Course: Patient was admitted to undergo surgical decompression at the ring of C1 secondary to some spondylitic myelopathic compression. He tolerated surgery well. He is ambulatory.  Consults: None  Significant Diagnostic Studies: None  Treatments: surgery: Cervical laminectomy of the ring of C1  Discharge Exam: Blood pressure (!) 155/92, pulse 82, temperature 98.2 F (36.8 C), resp. rate 18, SpO2 100 %. Incision is clean and dry. Motor function is intact station and gait are intact.  Disposition: 01-Home or Self Care  Discharge Instructions    Call MD for:  redness, tenderness, or signs of infection (pain, swelling, redness, odor or green/yellow discharge around incision site)    Complete by:  As directed    Call MD for:  severe uncontrolled pain    Complete by:  As directed    Call MD for:  temperature >100.4    Complete by:  As directed    Diet - low sodium heart healthy    Complete by:  As directed    Incentive spirometry RT    Complete by:  As directed    Increase activity slowly    Complete by:  As directed      Allergies as of 06/12/2016      Reactions   No Known Allergies       Medication List    TAKE these medications   cyclobenzaprine 10 MG tablet Commonly known as:  FLEXERIL 1 tab po bid as needed muscle spasms What changed:  how much to take  how to take this  when to take this  reasons to take this  additional instructions   diazepam 5 MG tablet Commonly known as:  VALIUM Take 1 tablet (5 mg total) by mouth every 6 (six) hours as  needed for muscle spasms.   diclofenac 75 MG EC tablet Commonly known as:  VOLTAREN Take 1 tablet (75 mg total) by mouth 2 (two) times daily.   naproxen sodium 220 MG tablet Commonly known as:  ANAPROX Take 440 mg by mouth 2 (two) times daily as needed (for pain.).   omeprazole 20 MG capsule Commonly known as:  PRILOSEC Take 1 capsule (20 mg total) by mouth daily. What changed:  when to take this  reasons to take this   oxyCODONE-acetaminophen 5-325 MG tablet Commonly known as:  PERCOCET/ROXICET Take 2 tablets by mouth every 4 (four) hours as needed for moderate pain.   traMADol 50 MG tablet Commonly known as:  ULTRAM 1 tab po q 6 hrs prn pain What changed:  how much to take  how to take this  when to take this  reasons to take this  additional instructions   VISINE OP Apply 1-2 drops to eye 3 (three) times daily as needed (for allergy eyes).        SignedStefani [name]: Louis [name] Louis [name] 06/12/2016, 1:46 PM

## 2016-06-12 NOTE — Progress Notes (Signed)
Orthopedic Tech Progress Note Patient Details:  Lonell Grandchildvery Damon Lightner 22-Oct-1975 [id number]  Ortho Devices Type of Ortho Device: Soft collar Ortho Device/Splint Interventions: Application   Saul FordyceJennifer C Asuna Peth 06/12/2016, 10:40 AM

## 2016-06-12 NOTE — Anesthesia Postprocedure Evaluation (Signed)
Anesthesia Post Note  Patient: Louis [name]  Procedure(s) Performed: Procedure(s) (LRB): Cervical One Posterior cervical laminectomy (N/A)     Patient location during evaluation: PACU Anesthesia Type: General Level of consciousness: awake and alert Pain management: pain level controlled Vital Signs Assessment: post-procedure vital signs reviewed and stable Respiratory status: spontaneous breathing, nonlabored ventilation, respiratory function stable and patient connected to nasal cannula oxygen Cardiovascular status: blood pressure returned to baseline and stable Postop Assessment: no signs of nausea or vomiting Anesthetic complications: no    Last Vitals:  Vitals:   06/12/16 1047 06/12/16 1310  BP: 138/85 (!) 155/92  Pulse: 75 82  Resp: 16 18  Temp: 36.1 C 36.8 C    Last Pain:  Vitals:   06/12/16 1200  TempSrc:   PainSc: 3                  Palmer Fahrner S

## 2016-06-12 NOTE — Discharge Instructions (Signed)

## 2016-06-12 NOTE — Anesthesia Procedure Notes (Signed)
Procedure Name: Intubation Date/Time: 06/12/2016 7:41 AM Performed by: [name], [name] Pre-anesthesia Checklist: Patient identified, Emergency Drugs available, Suction available and Patient being monitored Patient Re-evaluated:Patient Re-evaluated prior to inductionOxygen Delivery Method: Circle system utilized Preoxygenation: Pre-oxygenation with 100% oxygen Intubation Type: IV induction Ventilation: Mask ventilation without difficulty Laryngoscope Size: Glidescope (Elective Glidescope) Grade View: Grade II Tube type: Oral Tube size: 8.0 mm Number of attempts: 1 Airway Equipment and Method: Stylet Placement Confirmation: ETT inserted through vocal cords under direct vision,  positive ETCO2 and breath sounds checked- equal and bilateral Secured at: 22 cm Tube secured with: Tape Dental Injury: Teeth and Oropharynx as per pre-operative assessment

## 2016-06-12 NOTE — Progress Notes (Signed)
Pt doing well. Pt and wife given D/C instructions with Rx's, verbal understanding was provided. Pt's incision is clean and dry with no sign of infection. Pt's IV was removed prior to D/C. Pt D/C'd home via walking @ 1835 per MD order. Pt is stable @ D/C and has no other needs at this time. Rema FendtAshley Mahli Glahn, RN

## 2016-06-13 ENCOUNTER — Encounter (HOSPITAL_COMMUNITY): Payer: Self-pay | Admitting: Neurological Surgery

## 2016-07-05 ENCOUNTER — Other Ambulatory Visit: Payer: Self-pay | Admitting: Medical

## 2016-08-02 ENCOUNTER — Other Ambulatory Visit: Payer: Self-pay | Admitting: Medical

## 2016-08-18 ENCOUNTER — Other Ambulatory Visit: Payer: Self-pay | Admitting: Neurological Surgery

## 2016-08-18 DIAGNOSIS — M532X2 Spinal instabilities, cervical region: Secondary | ICD-10-CM

## 2016-08-29 ENCOUNTER — Other Ambulatory Visit: Payer: Self-pay | Admitting: Neurological Surgery

## 2016-08-29 ENCOUNTER — Ambulatory Visit
Admission: RE | Admit: 2016-08-29 | Discharge: 2016-08-29 | Disposition: A | Discharge status: Home / Self Care | Payer: Managed Care, Other (non HMO) | Source: Ambulatory Visit | Attending: Neurological Surgery | Admitting: Neurological Surgery

## 2016-08-29 DIAGNOSIS — M532X2 Spinal instabilities, cervical region: Secondary | ICD-10-CM

## 2016-08-29 DIAGNOSIS — M5416 Radiculopathy, lumbar region: Secondary | ICD-10-CM

## 2016-08-31 ENCOUNTER — Other Ambulatory Visit: Payer: Self-pay | Admitting: Neurological Surgery

## 2016-08-31 ENCOUNTER — Other Ambulatory Visit: Payer: Self-pay | Admitting: Medical

## 2016-09-13 NOTE — Pre-Procedure Instructions (Signed)
Tamala Barivery Damon Ems  09/13/2016      [id number] [address]- [place], [place] [id number] [phone] [place] [id number] Phone: [phone] Fax: [phone]    Your procedure is scheduled on September 18  Report [facility] Admitting at 0530 A.M.  Call this number if you have problems the morning of surgery:  [phone]   Remember:  Do not eat food or drink liquids after midnight.   Continue all other medications as directed by your physician except follow these medication instructions before surgery   Take these medicines the morning of surgery with A SIP OF WATER  acetaminophen (TYLENOL)  cyclobenzaprine (FLEXERIL) HYDROcodone-acetaminophen (NORCO/VICODIN) Eye drops  7 days prior to surgery STOP taking any Aspirin, Aleve, Naproxen, Ibuprofen, Motrin, Advil, Goody's, BC's, all herbal medications, fish oil, and all vitamins    Do not wear jewelry  Do not wear lotions, powders, or cologne, or deoderant.  Men may shave face and neck.  Do not bring valuables to the hospital.  [facility] is not responsible for any belongings or valuables.  Contacts, dentures or bridgework may not be worn into surgery.  Leave your suitcase in the car.  After surgery it may be brought to your room.  For patients admitted to the hospital, discharge time will be determined by your treatment team.  Patients discharged the day of surgery will not be allowed to drive home.    Special instructions:   [facility]- Preparing For Surgery  Before surgery, you can play an important role. Because skin is not sterile, your skin needs to be as free of germs as possible. You can reduce the number of germs on your skin by washing with CHG (chlorahexidine gluconate) Soap before surgery.  CHG is an antiseptic cleaner which kills germs and bonds with the skin to continue killing germs even after washing.  Please do not use if you have an allergy to CHG or antibacterial soaps. If your  skin becomes reddened/irritated stop using the CHG.  Do not shave (including legs and underarms) for at least 48 hours prior to first CHG shower. It is OK to shave your face.  Please follow these instructions carefully.   1. Shower the NIGHT BEFORE SURGERY and the MORNING OF SURGERY with CHG.   2. If you chose to wash your hair, wash your hair first as usual with your normal shampoo.  3. After you shampoo, rinse your hair and body thoroughly to remove the shampoo.  4. Use CHG as you would any other liquid soap. You can apply CHG directly to the skin and wash gently with a scrungie or a clean washcloth.   5. Apply the CHG Soap to your body ONLY FROM THE NECK DOWN.  Do not use on open wounds or open sores. Avoid contact with your eyes, ears, mouth and genitals (private parts). Wash genitals (private parts) with your normal soap.  6. Wash thoroughly, paying special attention to the area where your surgery will be performed.  7. Thoroughly rinse your body with warm water from the neck down.  8. DO NOT shower/wash with your normal soap after using and rinsing off the CHG Soap.  9. Pat yourself dry with a CLEAN TOWEL.   10. Wear CLEAN PAJAMAS   11. Place CLEAN SHEETS on your bed the night of your first shower and DO NOT SLEEP WITH PETS.    Day of Surgery: Do not apply any deodorants/lotions. Please wear clean clothes  to the hospital/surgery center.      Please read over the following fact sheets that you were given.

## 2016-09-14 ENCOUNTER — Encounter (HOSPITAL_COMMUNITY)
Admission: RE | Admit: 2016-09-14 | Discharge: 2016-09-14 | Disposition: A | Discharge status: Home / Self Care | Payer: Managed Care, Other (non HMO) | Source: Ambulatory Visit | Attending: Neurological Surgery | Admitting: Neurological Surgery

## 2016-09-14 ENCOUNTER — Encounter (HOSPITAL_COMMUNITY): Payer: Self-pay

## 2016-09-14 DIAGNOSIS — M7138 Other bursal cyst, other site: Secondary | ICD-10-CM | POA: Diagnosis present

## 2016-09-14 HISTORY — DX: Headache, unspecified: R51.9

## 2016-09-14 HISTORY — DX: Gastro-esophageal reflux disease without esophagitis: K21.9

## 2016-09-14 HISTORY — DX: Headache: R51

## 2016-09-14 LAB — CBC
HEMATOCRIT: 42.8 % (ref 39.0–52.0)
HEMOGLOBIN: 13.6 g/dL (ref 13.0–17.0)
MCH: 27.8 pg (ref 26.0–34.0)
MCHC: 31.8 g/dL (ref 30.0–36.0)
MCV: 87.5 fL (ref 78.0–100.0)
PLATELETS: 268 10*3/uL (ref 150–400)
RBC: 4.89 MIL/uL (ref 4.22–5.81)
RDW: 13.6 % (ref 11.5–15.5)
WBC: 8.8 10*3/uL (ref 4.0–10.5)

## 2016-09-14 LAB — BASIC METABOLIC PANEL
ANION GAP: 5 (ref 5–15)
BUN: 10 mg/dL (ref 6–20)
CALCIUM: 8.8 mg/dL — AB (ref 8.9–10.3)
CO2: 28 mmol/L (ref 22–32)
CREATININE: 1.06 mg/dL (ref 0.61–1.24)
Chloride: 104 mmol/L (ref 101–111)
GFR calc Af Amer: >60 mL/min (ref >60)
Glucose, Bld: 102 mg/dL — ABNORMAL HIGH (ref 65–99)
Potassium: 3.9 mmol/L (ref 3.5–5.1)
Sodium: 137 mmol/L (ref 135–145)

## 2016-09-14 LAB — SURGICAL PCR SCREEN
MRSA, PCR: NEGATIVE
STAPHYLOCOCCUS AUREUS: POSITIVE — AB

## 2016-09-14 NOTE — Progress Notes (Addendum)
PCP is Dr. Ramon DredgeEdward Saguier  LOV 04/2016   Denies any cardiac issues, no murmur, cp, sob. Stopped smoking in June 2018. He already has a tube of murpiricin at home.  Called and lm about results and to start using the ointment. BID x 5 days

## 2016-09-14 NOTE — Progress Notes (Signed)
   09/14/16 1125  OBSTRUCTIVE SLEEP APNEA  Have you ever been diagnosed with sleep apnea through a sleep study? No  Do you snore loudly (loud enough to be heard through closed doors)?  1  Do you often feel tired, fatigued, or sleepy during the daytime (such as falling asleep during driving or talking to someone)? 0  Has anyone observed you stop breathing during your sleep? 1  Do you have, or are you being treated for high blood pressure? 0  BMI more than 35 kg/m2? 1  Age > 50 (1-yes) 0  Neck circumference greater than:Male 16 inches or larger, Male 17inches or larger? 1  Male Gender (Yes=1) 1  Obstructive Sleep Apnea Score 5  Score 5 or greater  Results sent to PCP

## 2016-09-18 MED ORDER — DEXTROSE 5 % IV SOLN
3.0000 g | INTRAVENOUS | Status: AC
  Administered 2016-09-19: 3 g via INTRAVENOUS
  Filled 2016-09-18 (×3): qty 3000

## 2016-09-19 ENCOUNTER — Inpatient Hospital Stay (HOSPITAL_COMMUNITY): Payer: Managed Care, Other (non HMO) | Admitting: Certified Registered Nurse Anesthetist

## 2016-09-19 ENCOUNTER — Observation Stay (HOSPITAL_COMMUNITY)
Admission: RE | Admit: 2016-09-19 | Discharge: 2016-09-19 | Disposition: A | Discharge status: Home / Self Care | Payer: Managed Care, Other (non HMO) | Source: Ambulatory Visit | Attending: Neurological Surgery | Admitting: Neurological Surgery

## 2016-09-19 ENCOUNTER — Inpatient Hospital Stay (HOSPITAL_COMMUNITY): Payer: Managed Care, Other (non HMO)

## 2016-09-19 ENCOUNTER — Encounter (HOSPITAL_COMMUNITY): Payer: Self-pay | Admitting: Certified Registered Nurse Anesthetist

## 2016-09-19 ENCOUNTER — Encounter (HOSPITAL_COMMUNITY)
Admission: RE | Disposition: A | Discharge status: Home / Self Care | Payer: Self-pay | Source: Ambulatory Visit | Attending: Neurological Surgery

## 2016-09-19 DIAGNOSIS — Z6841 Body Mass Index (BMI) 40.0 and over, adult: Secondary | ICD-10-CM | POA: Insufficient documentation

## 2016-09-19 DIAGNOSIS — M5416 Radiculopathy, lumbar region: Secondary | ICD-10-CM | POA: Diagnosis present

## 2016-09-19 DIAGNOSIS — M1990 Unspecified osteoarthritis, unspecified site: Secondary | ICD-10-CM | POA: Diagnosis not present

## 2016-09-19 DIAGNOSIS — Z419 Encounter for procedure for purposes other than remedying health state, unspecified: Secondary | ICD-10-CM

## 2016-09-19 DIAGNOSIS — Z87891 Personal history of nicotine dependence: Secondary | ICD-10-CM | POA: Insufficient documentation

## 2016-09-19 DIAGNOSIS — K219 Gastro-esophageal reflux disease without esophagitis: Secondary | ICD-10-CM | POA: Diagnosis not present

## 2016-09-19 DIAGNOSIS — M7138 Other bursal cyst, other site: Secondary | ICD-10-CM | POA: Insufficient documentation

## 2016-09-19 DIAGNOSIS — M4727 Other spondylosis with radiculopathy, lumbosacral region: Principal | ICD-10-CM | POA: Insufficient documentation

## 2016-09-19 DIAGNOSIS — M47896 Other spondylosis, lumbar region: Secondary | ICD-10-CM | POA: Diagnosis present

## 2016-09-19 HISTORY — PX: LUMBAR LAMINECTOMY/DECOMPRESSION MICRODISCECTOMY: SHX5026

## 2016-09-19 SURGERY — LUMBAR LAMINECTOMY/DECOMPRESSION MICRODISCECTOMY 1 LEVEL
Anesthesia: General | Site: Back | Laterality: Right

## 2016-09-19 MED ORDER — ROCURONIUM BROMIDE 10 MG/ML (PF) SYRINGE
PREFILLED_SYRINGE | INTRAVENOUS | Status: AC
  Filled 2016-09-19: qty 5

## 2016-09-19 MED ORDER — HEMOSTATIC AGENTS (NO CHARGE) OPTIME
TOPICAL | Status: DC | PRN
  Administered 2016-09-19: 1 via TOPICAL

## 2016-09-19 MED ORDER — TRAMADOL HCL 50 MG PO TABS: 50.0000 mg | ORAL_TABLET | Freq: Four times a day (QID) | ORAL | Status: DC | PRN

## 2016-09-19 MED ORDER — ONDANSETRON HCL 4 MG/2ML IJ SOLN: 4.0000 mg | Freq: Four times a day (QID) | INTRAMUSCULAR | Status: DC | PRN

## 2016-09-19 MED ORDER — CYCLOBENZAPRINE HCL 10 MG PO TABS
10.0000 mg | ORAL_TABLET | Freq: Three times a day (TID) | ORAL | Status: DC | PRN
  Administered 2016-09-19: 10 mg via ORAL
  Filled 2016-09-19: qty 1

## 2016-09-19 MED ORDER — CHLORHEXIDINE GLUCONATE CLOTH 2 % EX PADS: 6.0000 | MEDICATED_PAD | Freq: Once | CUTANEOUS | Status: DC

## 2016-09-19 MED ORDER — BACITRACIN 50000 UNITS IM SOLR
INTRAMUSCULAR | Status: DC | PRN
  Administered 2016-09-19: 09:00:00

## 2016-09-19 MED ORDER — DIAZEPAM 5 MG PO TABS
ORAL_TABLET | ORAL | Status: AC
  Filled 2016-09-19: qty 1

## 2016-09-19 MED ORDER — OXYCODONE HCL 5 MG/5ML PO SOLN: 5.0000 mg | Freq: Once | ORAL | Status: AC | PRN

## 2016-09-19 MED ORDER — ACETAMINOPHEN 160 MG/5ML PO SOLN: 325.0000 mg | ORAL | Status: DC | PRN

## 2016-09-19 MED ORDER — METHOCARBAMOL 1000 MG/10ML IJ SOLN: 500.0000 mg | Freq: Four times a day (QID) | INTRAVENOUS | Status: DC | PRN

## 2016-09-19 MED ORDER — FLEET ENEMA 7-19 GM/118ML RE ENEM: 1.0000 | ENEMA | Freq: Once | RECTAL | Status: DC | PRN

## 2016-09-19 MED ORDER — ACETAMINOPHEN 325 MG PO TABS: 325.0000 mg | ORAL_TABLET | ORAL | Status: DC | PRN

## 2016-09-19 MED ORDER — DIAZEPAM 5 MG PO TABS
5.0000 mg | ORAL_TABLET | Freq: Four times a day (QID) | ORAL | Status: DC | PRN
  Administered 2016-09-19: 5 mg via ORAL

## 2016-09-19 MED ORDER — SENNA 8.6 MG PO TABS: 1.0000 | ORAL_TABLET | Freq: Two times a day (BID) | ORAL | Status: DC

## 2016-09-19 MED ORDER — ONDANSETRON HCL 4 MG PO TABS: 4.0000 mg | ORAL_TABLET | Freq: Four times a day (QID) | ORAL | Status: DC | PRN

## 2016-09-19 MED ORDER — ONDANSETRON HCL 4 MG/2ML IJ SOLN
INTRAMUSCULAR | Status: DC | PRN
  Administered 2016-09-19: 4 mg via INTRAVENOUS

## 2016-09-19 MED ORDER — SUCCINYLCHOLINE CHLORIDE 20 MG/ML IJ SOLN
INTRAMUSCULAR | Status: DC | PRN
  Administered 2016-09-19: 120 mg via INTRAVENOUS

## 2016-09-19 MED ORDER — METHOCARBAMOL 500 MG PO TABS: 500.0000 mg | ORAL_TABLET | Freq: Four times a day (QID) | ORAL | Status: DC | PRN

## 2016-09-19 MED ORDER — SODIUM CHLORIDE 0.9% FLUSH: 3.0000 mL | Freq: Two times a day (BID) | INTRAVENOUS | Status: DC

## 2016-09-19 MED ORDER — MIDAZOLAM HCL 2 MG/2ML IJ SOLN
INTRAMUSCULAR | Status: AC
  Filled 2016-09-19: qty 2

## 2016-09-19 MED ORDER — 0.9 % SODIUM CHLORIDE (POUR BTL) OPTIME
TOPICAL | Status: DC | PRN
  Administered 2016-09-19: 1000 mL

## 2016-09-19 MED ORDER — BUPIVACAINE HCL (PF) 0.5 % IJ SOLN
INTRAMUSCULAR | Status: DC | PRN
  Administered 2016-09-19: 5 mL
  Administered 2016-09-19: 25 mL

## 2016-09-19 MED ORDER — BUPIVACAINE HCL (PF) 0.5 % IJ SOLN
INTRAMUSCULAR | Status: AC
  Filled 2016-09-19: qty 30

## 2016-09-19 MED ORDER — MIDAZOLAM HCL 5 MG/5ML IJ SOLN
INTRAMUSCULAR | Status: DC | PRN
  Administered 2016-09-19: 2 mg via INTRAVENOUS

## 2016-09-19 MED ORDER — PROPOFOL 10 MG/ML IV BOLUS
INTRAVENOUS | Status: AC
  Filled 2016-09-19: qty 20

## 2016-09-19 MED ORDER — LACTATED RINGERS IV SOLN
INTRAVENOUS | Status: DC | PRN
  Administered 2016-09-19 (×2): via INTRAVENOUS

## 2016-09-19 MED ORDER — HYDROMORPHONE HCL 1 MG/ML IJ SOLN: 1.0000 mg | INTRAMUSCULAR | Status: DC | PRN

## 2016-09-19 MED ORDER — PHENOL 1.4 % MT LIQD: 1.0000 | OROMUCOSAL | Status: DC | PRN

## 2016-09-19 MED ORDER — SODIUM CHLORIDE 0.9% FLUSH: 3.0000 mL | INTRAVENOUS | Status: DC | PRN

## 2016-09-19 MED ORDER — ACETAMINOPHEN 650 MG RE SUPP: 650.0000 mg | RECTAL | Status: DC | PRN

## 2016-09-19 MED ORDER — THROMBIN 5000 UNITS EX SOLR
CUTANEOUS | Status: AC
  Filled 2016-09-19: qty 15000

## 2016-09-19 MED ORDER — HYDROMORPHONE HCL 1 MG/ML IJ SOLN
INTRAMUSCULAR | Status: AC
  Administered 2016-09-19: 0.5 mg via INTRAVENOUS
  Filled 2016-09-19: qty 1

## 2016-09-19 MED ORDER — THROMBIN 5000 UNITS EX SOLR
CUTANEOUS | Status: DC | PRN
  Administered 2016-09-19: 10000 [IU] via TOPICAL

## 2016-09-19 MED ORDER — HYDROMORPHONE HCL 1 MG/ML IJ SOLN
0.2500 mg | INTRAMUSCULAR | Status: DC | PRN
  Administered 2016-09-19 (×4): 0.5 mg via INTRAVENOUS

## 2016-09-19 MED ORDER — OXYCODONE HCL 5 MG PO TABS
ORAL_TABLET | ORAL | Status: AC
  Filled 2016-09-19: qty 1

## 2016-09-19 MED ORDER — LIDOCAINE-EPINEPHRINE 1 %-1:100000 IJ SOLN
INTRAMUSCULAR | Status: DC | PRN
  Administered 2016-09-19: 5 mL

## 2016-09-19 MED ORDER — CEFAZOLIN SODIUM-DEXTROSE 2-4 GM/100ML-% IV SOLN: 2.0000 g | Freq: Three times a day (TID) | INTRAVENOUS | Status: DC

## 2016-09-19 MED ORDER — TETRAHYDROZOLINE HCL 0.05 % OP SOLN
1.0000 [drp] | Freq: Four times a day (QID) | OPHTHALMIC | Status: DC
  Filled 2016-09-19: qty 15

## 2016-09-19 MED ORDER — POLYETHYLENE GLYCOL 3350 17 G PO PACK: 17.0000 g | PACK | Freq: Every day | ORAL | Status: DC | PRN

## 2016-09-19 MED ORDER — OXYCODONE-ACETAMINOPHEN 5-325 MG PO TABS: 2.0000 | ORAL_TABLET | ORAL | Status: DC | PRN

## 2016-09-19 MED ORDER — HYDROCODONE-ACETAMINOPHEN 5-325 MG PO TABS
1.0000 | ORAL_TABLET | Freq: Two times a day (BID) | ORAL | Status: DC
  Administered 2016-09-19: 1 via ORAL
  Filled 2016-09-19: qty 1

## 2016-09-19 MED ORDER — OXYCODONE-ACETAMINOPHEN 5-325 MG PO TABS: 1.0000 | ORAL_TABLET | Freq: Four times a day (QID) | ORAL | 0 refills | Status: DC | PRN

## 2016-09-19 MED ORDER — FENTANYL CITRATE (PF) 100 MCG/2ML IJ SOLN
INTRAMUSCULAR | Status: DC | PRN
  Administered 2016-09-19: 200 ug via INTRAVENOUS
  Administered 2016-09-19: 50 ug via INTRAVENOUS

## 2016-09-19 MED ORDER — DEXAMETHASONE SODIUM PHOSPHATE 10 MG/ML IJ SOLN
INTRAMUSCULAR | Status: AC
  Filled 2016-09-19: qty 1

## 2016-09-19 MED ORDER — OXYCODONE HCL 5 MG PO TABS
5.0000 mg | ORAL_TABLET | Freq: Once | ORAL | Status: AC | PRN
  Administered 2016-09-19: 5 mg via ORAL

## 2016-09-19 MED ORDER — SODIUM CHLORIDE 0.9 % IV SOLN: 250.0000 mL | INTRAVENOUS | Status: DC

## 2016-09-19 MED ORDER — DOCUSATE SODIUM 100 MG PO CAPS: 100.0000 mg | ORAL_CAPSULE | Freq: Two times a day (BID) | ORAL | Status: DC

## 2016-09-19 MED ORDER — PHENYLEPHRINE HCL 10 MG/ML IJ SOLN
INTRAMUSCULAR | Status: DC | PRN
  Administered 2016-09-19: 40 ug via INTRAVENOUS
  Administered 2016-09-19 (×2): 80 ug via INTRAVENOUS
  Administered 2016-09-19 (×4): 120 ug via INTRAVENOUS
  Administered 2016-09-19: 80 ug via INTRAVENOUS

## 2016-09-19 MED ORDER — ACETAMINOPHEN 325 MG PO TABS: 650.0000 mg | ORAL_TABLET | ORAL | Status: DC | PRN

## 2016-09-19 MED ORDER — LIDOCAINE HCL (CARDIAC) 20 MG/ML IV SOLN
INTRAVENOUS | Status: DC | PRN
  Administered 2016-09-19: 100 mg via INTRAVENOUS

## 2016-09-19 MED ORDER — SUGAMMADEX SODIUM 500 MG/5ML IV SOLN
INTRAVENOUS | Status: AC
  Filled 2016-09-19: qty 5

## 2016-09-19 MED ORDER — BISACODYL 10 MG RE SUPP: 10.0000 mg | Freq: Every day | RECTAL | Status: DC | PRN

## 2016-09-19 MED ORDER — ONDANSETRON HCL 4 MG/2ML IJ SOLN
INTRAMUSCULAR | Status: AC
  Filled 2016-09-19: qty 2

## 2016-09-19 MED ORDER — PROPOFOL 10 MG/ML IV BOLUS
INTRAVENOUS | Status: DC | PRN
  Administered 2016-09-19: 200 mg via INTRAVENOUS
  Administered 2016-09-19: 30 mg via INTRAVENOUS

## 2016-09-19 MED ORDER — LIDOCAINE-EPINEPHRINE 1 %-1:100000 IJ SOLN
INTRAMUSCULAR | Status: AC
  Filled 2016-09-19: qty 1

## 2016-09-19 MED ORDER — ROCURONIUM BROMIDE 100 MG/10ML IV SOLN
INTRAVENOUS | Status: DC | PRN
  Administered 2016-09-19: 70 mg via INTRAVENOUS

## 2016-09-19 MED ORDER — DEXAMETHASONE SODIUM PHOSPHATE 10 MG/ML IJ SOLN
INTRAMUSCULAR | Status: DC | PRN
  Administered 2016-09-19: 10 mg via INTRAVENOUS

## 2016-09-19 MED ORDER — MENTHOL 3 MG MT LOZG: 1.0000 | LOZENGE | OROMUCOSAL | Status: DC | PRN

## 2016-09-19 MED ORDER — FENTANYL CITRATE (PF) 250 MCG/5ML IJ SOLN
INTRAMUSCULAR | Status: AC
  Filled 2016-09-19: qty 5

## 2016-09-19 MED ORDER — LIDOCAINE 2% (20 MG/ML) 5 ML SYRINGE
INTRAMUSCULAR | Status: AC
  Filled 2016-09-19: qty 5

## 2016-09-19 MED ORDER — SUGAMMADEX SODIUM 200 MG/2ML IV SOLN
INTRAVENOUS | Status: DC | PRN
  Administered 2016-09-19: 360 mg via INTRAVENOUS

## 2016-09-19 SURGICAL SUPPLY — 51 items
BAG DECANTER FOR FLEXI CONT (MISCELLANEOUS) ×3 IMPLANT
BLADE CLIPPER SURG (BLADE) IMPLANT
BUR ACORN 6.0 (BURR) IMPLANT
BUR ACORN 6.0MM (BURR)
BUR MATCHSTICK NEURO 3.0 LAGG (BURR) ×3 IMPLANT
CANISTER SUCT 3000ML PPV (MISCELLANEOUS) ×3 IMPLANT
CARTRIDGE OIL MAESTRO DRILL (MISCELLANEOUS) IMPLANT
DECANTER SPIKE VIAL GLASS SM (MISCELLANEOUS) ×6 IMPLANT
DERMABOND ADVANCED (GAUZE/BANDAGES/DRESSINGS) ×2
DERMABOND ADVANCED .7 DNX12 (GAUZE/BANDAGES/DRESSINGS) ×1 IMPLANT
DEVICE DISSECT PLASMABLAD 3.0S (MISCELLANEOUS) ×1 IMPLANT
DIFFUSER DRILL AIR PNEUMATIC (MISCELLANEOUS) IMPLANT
DRAPE HALF SHEET 40X57 (DRAPES) IMPLANT
DRAPE LAPAROTOMY T 102X78X121 (DRAPES) ×3 IMPLANT
DRAPE MICROSCOPE LEICA (MISCELLANEOUS) ×3 IMPLANT
DRAPE POUCH INSTRU U-SHP 10X18 (DRAPES) ×3 IMPLANT
DRSG OPSITE POSTOP 3X4 (GAUZE/BANDAGES/DRESSINGS) ×3 IMPLANT
DURAPREP 26ML APPLICATOR (WOUND CARE) ×3 IMPLANT
ELECT REM PT RETURN 9FT ADLT (ELECTROSURGICAL) ×3
ELECTRODE REM PT RTRN 9FT ADLT (ELECTROSURGICAL) ×1 IMPLANT
GAUZE SPONGE 4X4 12PLY STRL (GAUZE/BANDAGES/DRESSINGS) IMPLANT
GAUZE SPONGE 4X4 16PLY XRAY LF (GAUZE/BANDAGES/DRESSINGS) IMPLANT
GLOVE BIOGEL PI IND STRL 8.5 (GLOVE) ×1 IMPLANT
GLOVE BIOGEL PI INDICATOR 8.5 (GLOVE) ×2
GLOVE ECLIPSE 8.5 STRL (GLOVE) ×3 IMPLANT
GLOVE ECLIPSE 9.0 STRL (GLOVE) ×3 IMPLANT
GOWN STRL REUS W/ TWL LRG LVL3 (GOWN DISPOSABLE) IMPLANT
GOWN STRL REUS W/ TWL XL LVL3 (GOWN DISPOSABLE) IMPLANT
GOWN STRL REUS W/TWL 2XL LVL3 (GOWN DISPOSABLE) ×3 IMPLANT
GOWN STRL REUS W/TWL LRG LVL3 (GOWN DISPOSABLE)
GOWN STRL REUS W/TWL XL LVL3 (GOWN DISPOSABLE)
KIT BASIN OR (CUSTOM PROCEDURE TRAY) ×3 IMPLANT
KIT ROOM TURNOVER OR (KITS) ×3 IMPLANT
NEEDLE HYPO 22GX1.5 SAFETY (NEEDLE) ×3 IMPLANT
NEEDLE SPNL 20GX3.5 QUINCKE YW (NEEDLE) ×3 IMPLANT
NS IRRIG 1000ML POUR BTL (IV SOLUTION) ×3 IMPLANT
OIL CARTRIDGE MAESTRO DRILL (MISCELLANEOUS)
PACK LAMINECTOMY NEURO (CUSTOM PROCEDURE TRAY) ×3 IMPLANT
PAD ARMBOARD 7.5X6 YLW CONV (MISCELLANEOUS) ×15 IMPLANT
PATTIES SURGICAL .5 X1 (DISPOSABLE) IMPLANT
PLASMABLADE 3.0S (MISCELLANEOUS) ×3
RUBBERBAND STERILE (MISCELLANEOUS) ×6 IMPLANT
SPONGE SURGIFOAM ABS GEL SZ50 (HEMOSTASIS) ×3 IMPLANT
SUT VIC AB 1 CT1 18XBRD ANBCTR (SUTURE) ×1 IMPLANT
SUT VIC AB 1 CT1 8-18 (SUTURE) ×2
SUT VIC AB 2-0 CP2 18 (SUTURE) ×3 IMPLANT
SUT VIC AB 3-0 SH 8-18 (SUTURE) ×3 IMPLANT
SYR BULB 3OZ (MISCELLANEOUS) ×3 IMPLANT
TOWEL GREEN STERILE (TOWEL DISPOSABLE) ×3 IMPLANT
TOWEL GREEN STERILE FF (TOWEL DISPOSABLE) ×3 IMPLANT
WATER STERILE IRR 1000ML POUR (IV SOLUTION) ×3 IMPLANT

## 2016-09-19 NOTE — Op Note (Signed)
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 BMI 55 Postoperative diagnosis: Same. BMI 55 Procedure: Laminotomy foraminotomy and decompression of the L5 and S1 nerve roots, removal of synovial cyst, operating microscope micro-dissection technique. Surgeon: [name] Anesthesia: Gen. endotracheal Indications: Louis [name] is a 41 year old individual who has had a severe right lumbar radiculopathy that has developed over a number of months. 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 Is advised regarding the need for surgical decompression.  Procedure: Patient was brought to the operating supine on a stretcher. After the smooth induction of general endotracheal anesthesia, he was turned prone. The back was prepped with alcohol DuraPrep and draped in a sterile fashion. Localizing radiograph was obtained with needle placement and then a small vertical incision was created in the lumbar dorsal spine. This was carried down through the subcutaneous tissue which measured nearly 15 cm in depth. The fascia was opened on the right side of the midline. Second localizing radiograph identified the L5-S1 space positively. It was noted that the patient had an S1-S2 interspace also. A self-retaining retractor was placed into this wound and then a high-speed bur was used to remove the inferior margin lamina of L5 out to and including the medial wall the facet. The outer layers of the ligament were removed and further dissection yielded the cystic structure on the medial aspect of the laminotomy area up facetectomy was performed and this allowed to remove the lateral portion of the cyst. Then by carefully dissecting away from the dura a substantial quantity of severely desiccated and degenerated cystic material was removed. The superior portion of the cyst was then explored under the operating microscope is felt to come away from the  common dural tube and superiorly the path of the exiting L5 nerve root could be identified. This was carefully decompressed using a 2 and 3 mm Kerrison punch. Once this area was inspected carefully hemostasis elsewhere was achieved the path the S1 nerve root was noted to be well decompressed. Microscope was removed and then the lumbar dorsal fascia was reapproximated with #1 Vicryl interrupted fashion 2-0 Vicryl was used in the subcutaneous anus tissues and 3-0 Vicryl subcutaneous particularly. Dermabond was placed on the skin. Patient was then carefully turned back to the supine position. His returned to recovery room in stable condition.

## 2016-09-19 NOTE — Discharge Summary (Signed)
Discharge summary: Date of admission: 09/19/2016 Date of discharge: 09/19/2016 Admitting diagnosis: L5-S1 spondylosis with  lumbar radiculopathy on the right. Morbid obesity. BMI 55 Discharge and final diagnosis: L5-S1 spondylosis with lumbar radiculopathy on the right secondary to synovial cyst. Morbid obesity, BMI 55. Condition on discharge: Stable Hospital course: Patient was admitted to undergo surgical decompression at L5-S1 secondary to the development of a synovial cyst. He tolerated surgery well. He's having good relief of his preoperative symptoms.  Discharge medications: Percocet 5/325, #40 no refills.

## 2016-09-19 NOTE — Progress Notes (Signed)
Pt doing well. Pt and wife given D/C instructions with Rx, verbal understanding was provided. Pt's incision is clean and dry with no sign of infection. Pt's IV was removed prior to D/C. Pt D/C'd home via wheelchair @ 1630 per MD order. Pt is stable @ D/C and has no other needs at this time. Rema Fendt, RN

## 2016-09-19 NOTE — H&P (Signed)
Date of admission: 09/19/2016 Admitting diagnosis: Right lumbar radiculopathy L5-S1 secondary to spondylosis and synovial cyst. Chief complaint: Right lower extremity pain History of present illness: Patient is a 41 year old morbidly obese individual who's had significant problems with spondylosis in the past. He had severe cervical stenosis at the level of C1-C2 that was relieved with a decompression at that level. He recovered nicely from that has developed a significant right lumbar radiculopathy. Recent MRI of lumbar spine demonstrates the development of a large synovial cyst at the facet joint at L5-S1 on the right side which has a moderate amount of arthropathy. This is clearly compressing the path of the S1 nerve root. Efforts at conservative management have failed and I discussed surgical intervention with a laminotomy and foraminotomy to decompress the S1 nerve root. He is now admitted for that procedure.  Past medical history: Patient's general health has been good, aside from the cervical surgery he had a Achilles heel repair in January 2014. Currently uses tramadol on occasion for pain diclofenac 75 mg twice a day and Flexeril as needed for muscle spasms no snow allergies to any medications.   Systems review: Negative on a 14 point review except for the problems in history present illness  Social history: Patient is married is working.  Physical examination: Patient is alert and oriented. Scraped of difficulty standing into the rectum position dense to favor a 10 forward stoop. His weakness in the gastroc and the gluteus on the right side of greater 4-5. He has an absent Achilles reflex 2+ patellar reflexes bilaterally. Straight leg raising is markedly positive at 15 on the right side negative on left side. Patrick's maneuver is negative. Palpation of his back does not reproduce any tenderness. Station and gait aside from the weakness in the gastroc is intact. Cranial nerve examination is  within the limits of normal. General physical exam reveals that his heart has regular rate and rhythm abdomen is soft bowel sounds positive no masses are noted extremities reveal no cyanosis clubbing or edema.  Impression: Patient has spondylosis at L5-S1 with a right-sided S1 nerve root compromise. He is now admitted to undergo laminotomy and foraminotomy decompression of the S1 nerve root.

## 2016-09-19 NOTE — Anesthesia Procedure Notes (Signed)
Procedure Name: Intubation Date/Time: 09/19/2016 7:56 AM Performed by: Faustino Congress Pariss Hommes Pre-anesthesia Checklist: Patient identified, Emergency Drugs available, Suction available and Patient being monitored Patient Re-evaluated:Patient Re-evaluated prior to induction Oxygen Delivery Method: Circle System Utilized Preoxygenation: Pre-oxygenation with 100% oxygen Induction Type: IV induction Ventilation: Mask ventilation without difficulty and Oral airway inserted - appropriate to patient size Laryngoscope Size: Glidescope and 4 (elective glidescope d/t body habitus) Grade View: Grade I Tube type: Oral Tube size: 7.5 mm Number of attempts: 1 Airway Equipment and Method: Stylet and Oral airway Placement Confirmation: ETT inserted through vocal cords under direct vision,  positive ETCO2 and breath sounds checked- equal and bilateral Secured at: 22 cm Tube secured with: Tape Dental Injury: Teeth and Oropharynx as per pre-operative assessment

## 2016-09-19 NOTE — Transfer of Care (Signed)
Immediate Anesthesia Transfer of Care Note  Patient: Louis [name]  Procedure(s) Performed: Procedure(s) with comments: Right Lumbar Five-Sacral one Laminotomy/Foraminotomy/removal of Synovial cyst (Right) - Right L5-S1 Laminotomy/Foraminotomy/removal of Synovial cyst  Patient Location: PACU  Anesthesia Type:General  Level of Consciousness: awake, alert , oriented and patient cooperative  Airway & Oxygen Therapy: Patient Spontanous Breathing and Patient connected to face mask oxygen  Post-op Assessment: Report given to RN and Post -op Vital signs reviewed and stable  Post vital signs: Reviewed and stable  Last Vitals:  Vitals:   09/19/16 0623  BP: 136/82  Pulse: 88  Resp: 20  Temp: 36.6 C  SpO2: 100%    Last Pain:  Vitals:   09/19/16 0639  TempSrc:   PainSc: 6          Complications: No apparent anesthesia complications

## 2016-09-19 NOTE — Evaluation (Signed)
Physical Therapy Evaluation Patient Details Name: Louis [name] MRN: [id number] DOB: 1975/06/24 Today's Date: 09/19/2016   History of Present Illness  Patient is a 41 yo male s/p spinal surgery  Clinical Impression  Patient seen for mobility assessment s/p spinal surgery. Mobilizing well. Educated patient on precautions, mobility expectations, safety and car transfers. No further acute PT needs. Will sign off.     Follow Up Recommendations No PT follow up    Equipment Recommendations  None recommended by PT    Recommendations for Other Services       Precautions / Restrictions Precautions Precautions: Back Precaution Booklet Issued: Yes (comment) Precaution Comments: verbally reviewed Restrictions Weight Bearing Restrictions: No      Mobility  Bed Mobility Overal bed mobility: Needs Assistance Bed Mobility: Rolling;Sidelying to Sit Rolling: Supervision Sidelying to sit: Supervision       General bed mobility comments: increased time to perform, VCs for technique, no physical assist required  Transfers Overall transfer level: Modified independent               General transfer comment: no physical assist, increased time to stand  Ambulation/Gait Ambulation/Gait assistance: Independent Ambulation Distance (Feet): 410 Feet Assistive device: None Gait Pattern/deviations: Wide base of support Gait velocity: modestly decreased   General Gait Details: steady with ambulation  Stairs            Wheelchair Mobility    Modified Rankin (Stroke Patients Only)       Balance Overall balance assessment: No apparent balance deficits (not formally assessed)                                           Pertinent Vitals/Pain Pain Assessment: Faces Faces Pain Scale: Hurts little more Pain Location: back Pain Descriptors / Indicators: Operative site guarding Pain Intervention(s): Monitored during session    Home Living  Family/patient expects to be discharged to:: Private residence Living Arrangements: Spouse/significant other Available Help at Discharge: Family Type of Home: House Home Access: Level entry     Home Layout: One level Home Equipment: None      Prior Function Level of Independence: Independent         Comments: n     Hand Dominance   Dominant Hand: Right    Extremity/Trunk Assessment   Upper Extremity Assessment Upper Extremity Assessment: Overall WFL for tasks assessed    Lower Extremity Assessment Lower Extremity Assessment: Overall WFL for tasks assessed    Cervical / Trunk Assessment Cervical / Trunk Assessment:  (s/p spinal surgery)  Communication   Communication: No difficulties  Cognition Arousal/Alertness: Awake/alert Behavior During Therapy: WFL for tasks assessed/performed Overall Cognitive Status: Within Functional Limits for tasks assessed                                        General Comments      Exercises     Assessment/Plan    PT Assessment Patent does not need any further PT services  PT Problem List         PT Treatment Interventions      PT Goals (Current goals can be found in the Care Plan section)  Acute Rehab PT Goals PT Goal Formulation: All assessment and education complete, DC therapy    Frequency  Barriers to discharge        Co-evaluation               AM-PAC PT "6 Clicks" Daily Activity  Outcome Measure Difficulty turning over in bed (including adjusting bedclothes, sheets and blankets)?: A Little Difficulty moving from lying on back to sitting on the side of the bed? : A Little Difficulty sitting down on and standing up from a chair with arms (e.g., wheelchair, bedside commode, etc,.)?: A Little Help needed moving to and from a bed to chair (including a wheelchair)?: None Help needed walking in hospital room?: None Help needed climbing 3-5 steps with a railing? : None 6 Click Score:  21    End of Session   Activity Tolerance: No increased pain;Patient tolerated treatment well Patient left:  (in restroom) Nurse Communication: Mobility status PT Visit Diagnosis: Difficulty in walking, not elsewhere classified (R26.2)    Time: 8295-6213 PT Time Calculation (min) (ACUTE ONLY): 19 min   Charges:   PT Evaluation $PT Eval Low Complexity: 1 Low     PT G Codes:        Charlotte Crumb, PT DPT  Board Certified Neurologic Specialist [phone]   Fabio Asa 09/19/2016, 1:48 PM

## 2016-09-19 NOTE — Discharge Instructions (Signed)

## 2016-09-19 NOTE — Anesthesia Preprocedure Evaluation (Signed)
Anesthesia Evaluation  Patient identified by MRN, date of birth, ID band Patient awake    Reviewed: Allergy & Precautions, NPO status , Patient's Chart, lab work & pertinent test results  History of Anesthesia Complications Negative for: history of anesthetic complications  Airway Mallampati: III  TM Distance: <3 FB Neck ROM: Full    Dental  (+) Teeth Intact   Pulmonary neg pulmonary ROS, Current Smoker, former smoker,    breath sounds clear to auscultation       Cardiovascular negative cardio ROS   Rhythm:Regular Rate:Normal     Neuro/Psych  Headaches,  Neuromuscular disease negative psych ROS   GI/Hepatic Neg liver ROS, GERD  Controlled,  Endo/Other  Morbid obesity  Renal/GU negative Renal ROS  negative genitourinary   Musculoskeletal  (+) Arthritis ,   Abdominal   Peds negative pediatric ROS (+)  Hematology negative hematology ROS (+)   Anesthesia Other Findings   Reproductive/Obstetrics negative OB ROS                             Anesthesia Physical Anesthesia Plan  ASA: III  Anesthesia Plan: General   Post-op Pain Management:    Induction: Intravenous  PONV Risk Score and Plan: 2 and Ondansetron and Dexamethasone  Airway Management Planned: Video Laryngoscope Planned  Additional Equipment: None  Intra-op Plan:   Post-operative Plan: Extubation in OR  Informed Consent: I have reviewed the patients History and Physical, chart, labs and discussed the procedure including the risks, benefits and alternatives for the proposed anesthesia with the patient or authorized representative who has indicated his/her understanding and acceptance.   Dental advisory given  Plan Discussed with: CRNA and Surgeon  Anesthesia Plan Comments:         Anesthesia Quick Evaluation

## 2016-09-20 ENCOUNTER — Encounter (HOSPITAL_COMMUNITY): Payer: Self-pay | Admitting: Neurological Surgery

## 2016-09-21 NOTE — Anesthesia Postprocedure Evaluation (Signed)
Anesthesia Post Note  Patient: Louis [name]  Procedure(s) Performed: Procedure(s) (LRB): Right Lumbar Five-Sacral one Laminotomy/Foraminotomy/removal of Synovial cyst (Right)     Patient location during evaluation: PACU Anesthesia Type: General Level of consciousness: awake and alert Pain management: pain level controlled Vital Signs Assessment: post-procedure vital signs reviewed and stable Respiratory status: spontaneous breathing, nonlabored ventilation, respiratory function stable and patient connected to nasal cannula oxygen Cardiovascular status: blood pressure returned to baseline and stable Postop Assessment: no apparent nausea or vomiting Anesthetic complications: no    Last Vitals:  Vitals:   09/19/16 1045 09/19/16 1059  BP:  (!) 117/95  Pulse: 82 86  Resp: 14 16  Temp: 36.7 C 36.6 C  SpO2: 98% 94%    Last Pain:  Vitals:   09/19/16 1610  TempSrc:   PainSc: 6                  Alyss Granato

## 2016-09-30 ENCOUNTER — Other Ambulatory Visit: Payer: Self-pay | Admitting: Medical

## 2016-10-04 NOTE — Telephone Encounter (Signed)
Refilled pt diclofenac.

## 2016-10-04 NOTE — Telephone Encounter (Signed)
ES-Rec req for Diclofenac/last OV 4.16.18/when would you want him to F/U and is refill ok? Plz advise/thx dmf

## 2016-10-24 ENCOUNTER — Other Ambulatory Visit: Payer: Self-pay | Admitting: Medical

## 2016-10-25 ENCOUNTER — Telehealth: Payer: Self-pay | Admitting: Medical

## 2016-10-25 MED ORDER — CYCLOBENZAPRINE HCL 10 MG PO TABS: ORAL_TABLET | ORAL | 0 refills | Status: DC

## 2016-10-25 NOTE — Telephone Encounter (Signed)
Refilled his flexeril. But only one month. No refills. He needs appointment for further refills. More than 6 months since I last saw him.

## 2016-10-25 NOTE — Telephone Encounter (Signed)
Pt requesting Flexeril refill. Last office visit 04/17/16. Please advise.

## 2016-10-26 NOTE — Telephone Encounter (Signed)
lvm advising patient of message below °

## 2016-10-26 NOTE — Telephone Encounter (Signed)
Pt has been scheduled.  °

## 2016-10-26 NOTE — Telephone Encounter (Signed)
Could you call ad schedule pt.

## 2016-10-30 ENCOUNTER — Ambulatory Visit (INDEPENDENT_AMBULATORY_CARE_PROVIDER_SITE_OTHER): Payer: Self-pay | Admitting: Medical

## 2016-10-30 ENCOUNTER — Encounter: Payer: Self-pay | Admitting: Medical

## 2016-10-30 VITALS — BP 160/90 | HR 83 | Temp 98.0°F | Resp 16 | Ht 71.0 in | Wt >= 6400 oz

## 2016-10-30 DIAGNOSIS — R0683 Snoring: Secondary | ICD-10-CM

## 2016-10-30 DIAGNOSIS — I10 Essential (primary) hypertension: Secondary | ICD-10-CM

## 2016-10-30 DIAGNOSIS — M545 Low back pain, unspecified: Secondary | ICD-10-CM

## 2016-10-30 MED ORDER — LOSARTAN POTASSIUM 100 MG PO TABS: 100.0000 mg | ORAL_TABLET | Freq: Every day | ORAL | 0 refills | Status: DC

## 2016-10-30 NOTE — Progress Notes (Signed)
Subjective:    Patient ID: Louis [name], male    DOB: Oct 03, 1975, 41 y.o.   MRN: [id number]  HPI  Pt states his neck surgery went well with Dr. Danielle Dess. Then later needed surgery for synovial cyst  that was pressing on lower lumber region nerve. He has missed work. He might go back to work in early November.  Pt states he does want to lose weight. Pt might consider lap-band surgery. Neurologist thinks he is a candidate. Pt does want to lose weight but he wants to wait for surgical option as he already missed work due to the above.  Pt has mild pain and tightness in lower back. He is only using diclofenac and flexeril.   Review of Systems  Constitutional: Negative for chills, fatigue and fever.  HENT:       Snores.  Respiratory: Negative for cough, chest tightness and shortness of breath.        Snores.  Cardiovascular: Negative for chest pain and palpitations.  Gastrointestinal: Negative for abdominal pain, blood in stool, constipation, nausea and vomiting.  Musculoskeletal: Positive for back pain. Negative for myalgias, neck pain and neck stiffness.       Now only occasional minimal back pain in scar area. But his lower back feels tight.  Skin: Negative for rash.  Neurological: Negative for dizziness, speech difficulty, weakness and headaches.  Hematological: Negative for adenopathy. Does not bruise/bleed easily.  Psychiatric/Behavioral: Negative for behavioral problems and confusion. The patient is not nervous/anxious.    Past Medical History:  Diagnosis Date  . Allergy   . Arthritis    in both knees  . GERD (gastroesophageal reflux disease)   . Headache      Social History   Social History  . Marital status: Married    Spouse name: N/A  . Number of children: N/A  . Years of education: N/A   Occupational History  . Not on file.   Social History Main Topics  . Smoking status: Former Smoker    Packs/day: 0.25    Years: 3.00    Types: Cigarettes    Quit  date: 06/14/2016  . Smokeless tobacco: Never Used  . 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  . Drug use: No  . Sexual activity: Yes   Other Topics Concern  . Not on file   Social History Narrative  . No narrative on file    Past Surgical History:  Procedure Laterality Date  . ACHILLES TENDON REPAIR    . ANTERIOR CRUCIATE LIGAMENT REPAIR    . LUMBAR LAMINECTOMY/DECOMPRESSION MICRODISCECTOMY Right 09/19/2016   Procedure: Right Lumbar Five-Sacral one Laminotomy/Foraminotomy/removal of Synovial cyst;  Surgeon: [name], MD;  Location: [facility] OR;  Service: Neurosurgery;  Laterality: Right;  Right L5-S1 Laminotomy/Foraminotomy/removal of Synovial cyst  . POSTERIOR CERVICAL LAMINECTOMY N/A 06/12/2016   Procedure: Cervical One Posterior cervical laminectomy;  Surgeon: [name], MD;  Location: [facility] OR;  Service: Neurosurgery;  Laterality: N/A;  posterior  . TONSILLECTOMY      Family History  Problem Relation Age of Onset  . Diabetes Father     Allergies  Allergen Reactions  . No Known Allergies     Current Outpatient Prescriptions on File Prior to Visit  Medication Sig Dispense Refill  . acetaminophen (TYLENOL) 500 MG tablet Take 1,000 mg by mouth every 6 (six) hours as needed (for pain.).    Marland Kitchen  cyclobenzaprine (FLEXERIL) 10 MG tablet TAKE 1 TABLET BY MOUTH 2 TIMES A DAILY 60 tablet 0  . diazepam (VALIUM) 5 MG tablet Take 1 tablet (5 mg total) by mouth every 6 (six) hours as needed for muscle spasms. 30 tablet 0  . diclofenac (VOLTAREN) 75 MG EC tablet TAKE 1 TABLET (75 MG TOTAL) BY MOUTH 2 (TWO) TIMES DAILY. 60 tablet 0  . HYDROcodone-acetaminophen (NORCO/VICODIN) 5-325 MG tablet Take 1 tablet by mouth 2 (two) times daily.    Marland Kitchen. oxyCODONE-acetaminophen (PERCOCET/ROXICET) 5-325 MG tablet Take 2 tablets by mouth every 4 (four) hours as needed for moderate pain. 60 tablet 0  . oxyCODONE-acetaminophen (ROXICET) 5-325 MG  tablet Take 1-2 tablets by mouth every 6 (six) hours as needed for severe pain. 40 tablet 0  . Tetrahydrozoline HCl (VISINE OP) Apply 1-2 drops to eye 3 (three) times daily as needed (for allergy eyes).    . traMADol (ULTRAM) 50 MG tablet 1 tab po q 6 hrs prn pain 120 tablet 0   No current facility-administered medications on file prior to visit.     BP (!) 164/105   Pulse 83   Temp 98 F (36.7 C) (Oral)   Resp 16   Ht 5\' 11"  (1.803 m)   Wt (!) 415 lb 9.6 oz (188.5 kg)   SpO2 97%   BMI 57.96 kg/m       Objective:   Physical Exam  General Mental Status- Alert. General Appearance- Not in acute distress.   Skin General: Color- Normal Color. Moisture- Normal Moisture.  Neck Carotid Arteries- Normal color. Moisture- Normal Moisture. No carotid bruits. No JVD.  Chest and Lung Exam Auscultation: Breath Sounds:-Normal.  Cardiovascular Auscultation:Rythm- Regular. Murmurs & Other Heart Sounds:Auscultation of the heart reveals- No Murmurs.  Abdomen Inspection:-Inspeection Normal. Palpation/Percussion:Note:No mass. Palpation and Percussion of the abdomen reveal- Non Tender, Non Distended + BS, no rebound or guarding.   Back Mid lumbar spine tenderness to palpation. Pain on straight leg lift. Pain on lateral movements and flexion/extension of the spine.  Lower ext neurologic  L5-S1 sensation intact bilaterally. Normal patellar reflexes bilaterally. No foot drop bilaterally.      Assessment & Plan:  For low back pain continue diclofenac and flexeril. But over next 5 days try to not use nsaid since can effect your blood pressure.  Will refer you back to weight loss specialist.  Can refer you to pulmonologist for sleep study when you are ready.Let me know.  For likely rx losartan. Get otc blood pressure monitor/wrist type. Check bp daily if bp 140/90 or above then start losartan. We would need nurse bp blood pressure check in 10-14 days after starting losartan. (or  update us with Dr. Danielle DessElsner bp reading)  Bryannah Boston, Ramon DredgeEdward, PA-C

## 2016-10-30 NOTE — Patient Instructions (Addendum)
For low back pain continue diclofenac and flexeril. But over next 5 days try to not use nsaid(including diclofenac for next 5 days) since can effect your blood pressure.  Will refer you back to weight loss specialist.  Can refer you to pulmonologist for sleep study when you are ready.Let me know.  For likely rx losartan. Get otc blood pressure monitor/wrist type. Check bp daily if bp 140/90 or above then start losartan. We would need nurse bp blood pressure check in 10-14 days after starting losartan.(or update us with Dr. Danielle DessElsner bp reading)

## 2016-11-21 ENCOUNTER — Other Ambulatory Visit: Payer: Self-pay | Admitting: Medical

## 2016-12-22 ENCOUNTER — Other Ambulatory Visit: Payer: Self-pay | Admitting: Medical

## 2017-01-09 ENCOUNTER — Other Ambulatory Visit: Payer: Self-pay | Admitting: Medical

## 2017-01-22 ENCOUNTER — Other Ambulatory Visit: Payer: Self-pay | Admitting: Medical

## 2017-02-15 ENCOUNTER — Other Ambulatory Visit: Payer: Self-pay | Admitting: Medical

## 2017-02-22 ENCOUNTER — Encounter (INDEPENDENT_AMBULATORY_CARE_PROVIDER_SITE_OTHER): Payer: Self-pay

## 2017-03-08 ENCOUNTER — Encounter (INDEPENDENT_AMBULATORY_CARE_PROVIDER_SITE_OTHER): Payer: Self-pay | Admitting: Family Medicine

## 2017-03-08 ENCOUNTER — Ambulatory Visit (INDEPENDENT_AMBULATORY_CARE_PROVIDER_SITE_OTHER): Payer: Managed Care, Other (non HMO) | Admitting: Family Medicine

## 2017-03-08 VITALS — BP 161/76 | HR 71 | Temp 98.0°F | Ht 70.0 in | Wt >= 6400 oz

## 2017-03-08 DIAGNOSIS — Z1331 Encounter for screening for depression: Secondary | ICD-10-CM

## 2017-03-08 DIAGNOSIS — Z6841 Body Mass Index (BMI) 40.0 and over, adult: Secondary | ICD-10-CM | POA: Diagnosis not present

## 2017-03-08 DIAGNOSIS — Z0289 Encounter for other administrative examinations: Secondary | ICD-10-CM

## 2017-03-08 DIAGNOSIS — I10 Essential (primary) hypertension: Secondary | ICD-10-CM

## 2017-03-08 DIAGNOSIS — R5383 Other fatigue: Secondary | ICD-10-CM

## 2017-03-08 DIAGNOSIS — E66813 Obesity, class 3: Secondary | ICD-10-CM

## 2017-03-08 DIAGNOSIS — Z9189 Other specified personal risk factors, not elsewhere classified: Secondary | ICD-10-CM | POA: Diagnosis not present

## 2017-03-08 DIAGNOSIS — R7303 Prediabetes: Secondary | ICD-10-CM

## 2017-03-08 DIAGNOSIS — R0602 Shortness of breath: Secondary | ICD-10-CM | POA: Diagnosis not present

## 2017-03-08 NOTE — Progress Notes (Signed)
Office: [phone]  /  Fax: [phone]   Dear Esperanza Richters, PA-C,   Thank you for referring Louis [name] to our clinic. The following note includes my evaluation and treatment recommendations.  HPI:   Chief Complaint: OBESITY    Louis [name] has been referred by Esperanza Richters, PA-C for consultation regarding his obesity and obesity related comorbidities.    Louis [name] (MR# [id number]) is a 42 y.o. male who presents on 03/08/2017 for obesity evaluation and treatment. Current BMI is Body mass index is 58.25 kg/m.Marland Kitchen Louis [name] has been struggling with his weight for many years and has been unsuccessful in either losing weight, maintaining weight loss, or reaching his healthy weight goal. Louis [name] doesn't eat much and barely eats meals. He feels his portion of food is skewed and his metabolism is "in park".     Louis [name] attended our information session and states he is currently in the action stage of change and ready to dedicate time achieving and maintaining a healthier weight. Louis [name] is interested in becoming our patient and working on intensive lifestyle modifications including (but not limited to) diet, exercise and weight loss.    Louis [name] states his family eats meals together he thinks his family will eat healthier with  him his desired weight loss is 130 lbs he has been heavy most of  his life he started gaining weight during time off for surgery his heaviest weight ever was 415 lbs. he is a picky eater and doesn't like to eat healthier foods  he skips meals frequently he is frequently drinking liquids with calories he frequently makes poor food choices he struggles with emotional eating    Fatigue Louis [name] feels his energy is lower than it should be. This has worsened with weight gain and has not worsened recently. Louis [name] admits to daytime somnolence and  admits to waking up still tired. Patient is at risk for obstructive sleep apnea. Patent has a history of symptoms of daytime  fatigue, morning fatigue, morning headache and hypertension. Patient is only sleeping 4 hours per night, and states they generally have restless sleep. Snoring is present. Apneic episodes are present. Epworth Sleepiness Score is 15 There is a significant difference between estimated RMR and actual RMR. EKG is within normal limits.  Dyspnea on exertion Louis [name] notes increasing shortness of breath with exercising and seems to be worsening over time with weight gain. He notes getting out of breath sooner with activity than he used to. This has not gotten worse recently.  Louis [name] denies orthopnea.  Hypertension [name] is a 42 y.o. male with hypertension. His blood pressure is elevated today at 161/76, but he didn't take his medicine this morning. Louis [name] denies chest pain, chest pressure or headache. He is working weight loss to help control his blood pressure with the goal of decreasing his risk of heart attack and stroke. Averys blood pressure is not currently controlled.  Pre-Diabetes Louis [name] has a diagnosis of pre-diabetes based on his elevated Hgb A1c of 5.8 previously. [name] was informed this puts him at greater risk of developing diabetes. He is not taking metformin currently and continues to work on diet and exercise to decrease risk of diabetes. He denies nausea, polyphagia or hypoglycemia.  At risk for diabetes [name] is at higher than average risk for developing diabetes due to his obesity and pre-diabetes. He currently denies polyuria or polydipsia.  Depression Screen Louis [name]'s Food and Mood (modified PHQ-9) score was  Depression screen Louis [name] 2/9 03/08/2017  Decreased Interest 1  Down, Depressed, Hopeless 0  PHQ - 2 Score 1  Altered sleeping 2  Tired, decreased energy 3  Change in appetite 0  Feeling bad or failure about yourself  0  Trouble concentrating 0  Moving slowly or fidgety/restless 0  Suicidal thoughts 0  PHQ-9 Score 6  Difficult doing work/chores Somewhat  difficult    ALLERGIES: Allergies  Allergen Reactions  . No Known Allergies     MEDICATIONS: Current Outpatient Medications on File Prior to Visit  Medication Sig Dispense Refill  . acetaminophen (TYLENOL) 500 MG tablet Take 1,000 mg by mouth every 6 (six) hours as needed (for pain.).    Marland Kitchen. diclofenac (VOLTAREN) 75 MG EC tablet TAKE 1 TABLET (75 MG TOTAL) BY MOUTH 2 (TWO) TIMES DAILY. 60 tablet 2  . losartan (COZAAR) 100 MG tablet TAKE 1 TABLET BY MOUTH EVERY DAY 90 tablet 0   No current facility-administered medications on file prior to visit.     PAST MEDICAL HISTORY: Past Medical History:  Diagnosis Date  . Allergy   . Arthritis    in both knees  . GERD (gastroesophageal reflux disease)   . Headache   . HTN (hypertension)   . Obesity     PAST SURGICAL HISTORY: Past Surgical History:  Procedure Laterality Date  . ACHILLES TENDON REPAIR    . ANTERIOR CRUCIATE LIGAMENT REPAIR    . LUMBAR LAMINECTOMY/DECOMPRESSION MICRODISCECTOMY Right 09/19/2016   Procedure: Right Lumbar Five-Sacral one Laminotomy/Foraminotomy/removal of Synovial cyst;  Surgeon: Barnett AbuElsner, Henry, MD;  [facility];  Service: Neurosurgery;  Laterality: Right;  Right L5-S1 Laminotomy/Foraminotomy/removal of Synovial cyst  . POSTERIOR CERVICAL LAMINECTOMY N/A 06/12/2016   Procedure: Cervical One Posterior cervical laminectomy;  Surgeon: Barnett AbuElsner, Henry, MD;  [facility];  Service: Neurosurgery;  Laterality: N/A;  posterior  . TONSILLECTOMY      SOCIAL HISTORY: Social History   Tobacco Use  . Smoking status: Former Smoker    Packs/day: 0.25    Years: 3.00    Pack years: 0.75    Types: Cigarettes    Last attempt to quit: 06/14/2016    Years since quitting: 0.7  . Smokeless tobacco: Never Used  Substance Use Topics  . 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  . Drug use: No    FAMILY HISTORY: Family History    Problem Relation Age of Onset  . Diabetes Father   . Obesity Father     ROS: Review of Systems  Constitutional: Positive for malaise/fatigue.  HENT:       Hay Fever  Respiratory: Positive for shortness of breath (on exertion).   Cardiovascular: Negative for chest pain.       Negative for chest pressure  Gastrointestinal: Negative for nausea.  Genitourinary: Negative for frequency.  Musculoskeletal:       Neck Stiffness  Neurological: Negative for headaches.  Endo/Heme/Allergies: Negative for polydipsia.       Negative for polyphagia Negative for hypoglycemia  Psychiatric/Behavioral: The patient has insomnia.     PHYSICAL EXAM: Blood pressure (!) 161/76, pulse 71, temperature 98 F (36.7 C), temperature source Oral, height 5\' 10"  (1.778 m), weight (!) 406 lb (184.2 kg), SpO2 98 %. Body mass index is 58.25 kg/m. Physical Exam  Constitutional: He is oriented to person, place, and time. He appears well-developed and well-nourished.  HENT:  Head:  Normocephalic.  Nose: Nose normal.  Eyes: EOM are normal. No scleral icterus.  Neck: Normal range of motion. Neck supple. No thyromegaly present.  Cardiovascular: Normal rate and regular rhythm.  Pulmonary/Chest: Effort normal. No respiratory distress.  Abdominal: Soft. There is no tenderness.  +obesity  Musculoskeletal: Normal range of motion.  Range of Motion normal in all 4 extremities  Neurological: He is alert and oriented to person, place, and time. Coordination normal.  Skin: Skin is warm and dry.  +Acanthosis Nigricans  Psychiatric: He has a normal mood and affect. His behavior is normal.  Vitals reviewed.   RECENT LABS AND TESTS: BMET    Component Value Date/Time   NA 137 09/14/2016 1140   K 3.9 09/14/2016 1140   CL 104 09/14/2016 1140   CO2 28 09/14/2016 1140   GLUCOSE 102 (H) 09/14/2016 1140   BUN 10 09/14/2016 1140   CREATININE 1.06 09/14/2016 1140   CREATININE 0.94 11/15/2015 0833   CALCIUM 8.8 (L)  09/14/2016 1140   GFRNONAA >60 09/14/2016 1140   GFRNONAA >89 11/15/2015 0833   GFRAA >60 09/14/2016 1140   GFRAA >89 11/15/2015 0833   Lab Results  Component Value Date   HGBA1C 5.8 11/16/2015   No results found for: INSULIN CBC    Component Value Date/Time   WBC 8.8 09/14/2016 1140   RBC 4.89 09/14/2016 1140   HGB 13.6 09/14/2016 1140   HCT 42.8 09/14/2016 1140   PLT 268 09/14/2016 1140   MCV 87.5 09/14/2016 1140   MCH 27.8 09/14/2016 1140   MCHC 31.8 09/14/2016 1140   RDW 13.6 09/14/2016 1140   LYMPHSABS 2.1 11/15/2015 0833   MONOABS 0.4 11/15/2015 0833   EOSABS 0.1 11/15/2015 0833   BASOSABS 0.0 11/15/2015 0833   Iron/TIBC/Ferritin/ %Sat No results found for: IRON, TIBC, FERRITIN, IRONPCTSAT Lipid Panel     Component Value Date/Time   CHOL 175 11/16/2015 0808   TRIG 63.0 11/16/2015 0808   HDL 50.80 11/16/2015 0808   CHOLHDL 3 11/16/2015 0808   VLDL 12.6 11/16/2015 0808   LDLCALC 111 (H) 11/16/2015 0808   Hepatic Function Panel     Component Value Date/Time   PROT 7.4 11/15/2015 0833   ALBUMIN 4.0 11/15/2015 0833   AST 27 11/15/2015 0833   ALT 32 11/15/2015 0833   ALKPHOS 74 11/15/2015 0833   BILITOT 0.3 11/15/2015 0833      Component Value Date/Time   TSH 1.07 11/15/2015 0833    ECG  shows NSR with a rate of 70 INDIRECT CALORIMETER done today shows a VO2 of 200 and a REE of 1390.  His calculated basal metabolic rate is 1610 thus his basal metabolic rate is worse than expected.    ASSESSMENT AND PLAN: Other fatigue - Plan: EKG 12-Lead, Hemoglobin A1c, Insulin, random, VITAMIN D 25 Hydroxy (Vit-D Deficiency, Fractures), Vitamin B12, Folate, T3, T4, free, TSH  Shortness of breath on exertion  Essential hypertension - Plan: Comprehensive metabolic panel, CBC with Differential/Platelet, Hemoglobin A1c, Insulin, random, Lipid panel, VITAMIN D 25 Hydroxy (Vit-D Deficiency, Fractures), Folate  Pre-diabetes  Depression screening  At risk for diabetes  mellitus - Plan: Hemoglobin A1c, Insulin, random  Class 3 severe obesity with serious comorbidity and body mass index (BMI) of 50.0 to 59.9 in adult, unspecified obesity type (HCC)  PLAN: Fatigue Rodricus was informed that his fatigue may be related to obesity, depression or many other causes. Labs will be ordered, and in the meanwhile Ziare has agreed to work on diet,  exercise and weight loss to help with fatigue. Proper sleep hygiene was discussed including the need for 7-8 hours of quality sleep each night. EKG, indirect calorimetry and a sleep study were ordered based on symptoms and Epworth score.  Dyspnea on exertion Diarra's shortness of breath appears to be obesity related and exercise induced. He has agreed to work on weight loss and gradually increase exercise to treat his exercise induced shortness of breath. If Tallen follows our instructions and loses weight without improvement of his shortness of breath, we will plan to refer to pulmonology. EKG, indirect calorimetry and a sleep study were ordered. We will monitor this condition regularly. Ocie agrees to this plan.  Hypertension We discussed sodium restriction, working on healthy weight loss, and a regular exercise program as the means to achieve improved blood pressure control. Panfilo agreed with this plan and agreed to follow up as directed. We will follow up at his next visit and will continue to monitor his blood pressure as well as his progress with the above lifestyle modifications. He will continue Losartan as prescribed and will watch for signs of hypotension as he continues his lifestyle modifications.  Pre-Diabetes Manav will continue to work on weight loss, exercise, and decreasing simple carbohydrates in his diet to help decrease the risk of diabetes. He was informed that eating too many simple carbohydrates or too many calories at one sitting increases the likelihood of GI side effects. We will check Hgb A1c and insulin today.  Louis [name] agreed to follow up with [name] as directed to monitor his progress.  Diabetes risk counseling Roen was given extended (15 minutes) diabetes prevention counseling today. He is 42 y.o. male and has risk factors for diabetes including obesity and pre-diabetes. We discussed intensive lifestyle modifications today with an emphasis on weight loss as well as increasing exercise and decreasing simple carbohydrates in his diet.  Depression Screen Louis [name] had a mildly positive depression screening. Depression is commonly associated with obesity and often results in emotional eating behaviors. We will monitor this closely and work on CBT to help improve the non-hunger eating patterns. Referral to Psychology may be required if no improvement is seen as he continues in our clinic.  Obesity Kelii is currently in the action stage of change and his goal is to continue with weight loss efforts. I recommend Louis [name] begin the structured treatment plan as follows:  He has agreed to follow the Category 3 plan Louis [name] has been instructed to eventually work up to a goal of 150 minutes of combined cardio and strengthening exercise per week for weight loss and overall health benefits. We discussed the following Behavioral Modification Strategies today: better snacking choices, planning for success, increasing lean protein intake, work on meal planning and easy cooking plans and decrease ETOH   He was informed of the importance of frequent follow up visits to maximize his success with intensive lifestyle modifications for his multiple health conditions. He was informed we would discuss his lab results at his next visit unless there is a critical issue that needs to be addressed sooner. Louis [name] agreed to keep his next visit at the agreed upon time to discuss these results.    OBESITY BEHAVIORAL INTERVENTION VISIT  Today's visit was # 1 out of 22.  Starting weight: 406 lbs Starting date: 03/08/17 Today's weight : 406 lbs    Today's date: 03/08/2017 Total lbs lost to date: 0 (Patients must lose 7 lbs in the first 6 months to continue with counseling)  ASK: We discussed the diagnosis of obesity with Louis [name] today and Louis [name] agreed to give [name] permission to discuss obesity behavioral modification therapy today.  ASSESS: Louis [name] has the diagnosis of obesity and his BMI today is 58.25 Louis [name] is in the action stage of change   ADVISE: Louis [name] was educated on the multiple health risks of obesity as well as the benefit of weight loss to improve his health. He was advised of the need for long term treatment and the importance of lifestyle modifications.  AGREE: Multiple dietary modification options and treatment options were discussed and  Louis [name] agreed to the above obesity treatment plan.   I, Nevada Crane, am acting as transcriptionist for  Filbert Schilder, MD   I have reviewed the above documentation for accuracy and completeness, and I agree with the above. - Debbra Riding, MD

## 2017-03-09 LAB — COMPREHENSIVE METABOLIC PANEL
ALT: 37 IU/L (ref 0–44)
AST: 31 IU/L (ref 0–40)
Albumin/Globulin Ratio: 1 — ABNORMAL LOW (ref 1.2–2.2)
Albumin: 3.9 g/dL (ref 3.5–5.5)
Alkaline Phosphatase: 79 IU/L (ref 39–117)
BUN/Creatinine Ratio: 12 (ref 9–20)
BUN: 12 mg/dL (ref 6–24)
Bilirubin Total: 0.3 mg/dL (ref 0.0–1.2)
CALCIUM: 9 mg/dL (ref 8.7–10.2)
CO2: 25 mmol/L (ref 20–29)
Chloride: 102 mmol/L (ref 96–106)
Creatinine, Ser: 0.99 mg/dL (ref 0.76–1.27)
GFR, EST AFRICAN AMERICAN: 108 mL/min/{1.73_m2} (ref >59)
GFR, EST NON AFRICAN AMERICAN: 94 mL/min/{1.73_m2} (ref >59)
GLUCOSE: 97 mg/dL (ref 65–99)
Globulin, Total: 3.8 g/dL (ref 1.5–4.5)
Potassium: 4.5 mmol/L (ref 3.5–5.2)
Sodium: 144 mmol/L (ref 134–144)
TOTAL PROTEIN: 7.7 g/dL (ref 6.0–8.5)

## 2017-03-09 LAB — CBC WITH DIFFERENTIAL/PLATELET
BASOS ABS: 0 10*3/uL (ref 0.0–0.2)
BASOS: 0 %
EOS (ABSOLUTE): 0.1 10*3/uL (ref 0.0–0.4)
Eos: 1 %
HEMOGLOBIN: 13.2 g/dL (ref 13.0–17.7)
Hematocrit: 41.5 % (ref 37.5–51.0)
IMMATURE GRANS (ABS): 0 10*3/uL (ref 0.0–0.1)
Immature Granulocytes: 0 %
LYMPHS: 28 %
Lymphocytes Absolute: 2.4 10*3/uL (ref 0.7–3.1)
MCH: 28.5 pg (ref 26.6–33.0)
MCHC: 31.8 g/dL (ref 31.5–35.7)
MCV: 90 fL (ref 79–97)
MONOCYTES: 5 %
Monocytes Absolute: 0.4 10*3/uL (ref 0.1–0.9)
NEUTROS ABS: 5.7 10*3/uL (ref 1.4–7.0)
Neutrophils: 66 %
Platelets: 285 10*3/uL (ref 150–379)
RBC: 4.63 x10E6/uL (ref 4.14–5.80)
RDW: 14.7 % (ref 12.3–15.4)
WBC: 8.6 10*3/uL (ref 3.4–10.8)

## 2017-03-09 LAB — LIPID PANEL
CHOLESTEROL TOTAL: 157 mg/dL (ref 100–199)
Chol/HDL Ratio: 2.7 ratio (ref 0.0–5.0)
HDL: 58 mg/dL (ref >39)
LDL Calculated: 85 mg/dL (ref 0–99)
TRIGLYCERIDES: 68 mg/dL (ref 0–149)
VLDL Cholesterol Cal: 14 mg/dL (ref 5–40)

## 2017-03-09 LAB — TSH: TSH: 1.26 u[IU]/mL (ref 0.450–4.500)

## 2017-03-09 LAB — VITAMIN B12: Vitamin B-12: 303 pg/mL (ref 232–1245)

## 2017-03-09 LAB — T3: T3 TOTAL: 89 ng/dL (ref 71–180)

## 2017-03-09 LAB — T4, FREE: Free T4: 1.09 ng/dL (ref 0.82–1.77)

## 2017-03-09 LAB — VITAMIN D 25 HYDROXY (VIT D DEFICIENCY, FRACTURES): Vit D, 25-Hydroxy: 5.2 ng/mL — ABNORMAL LOW (ref 30.0–100.0)

## 2017-03-09 LAB — HEMOGLOBIN A1C
Est. average glucose Bld gHb Est-mCnc: 123 mg/dL
HEMOGLOBIN A1C: 5.9 % — AB (ref 4.8–5.6)

## 2017-03-09 LAB — FOLATE: FOLATE: 7.5 ng/mL (ref >3.0)

## 2017-03-09 LAB — INSULIN, RANDOM: INSULIN: 18 u[IU]/mL (ref 2.6–24.9)

## 2017-03-22 ENCOUNTER — Ambulatory Visit (INDEPENDENT_AMBULATORY_CARE_PROVIDER_SITE_OTHER): Payer: Managed Care, Other (non HMO) | Admitting: Family Medicine

## 2017-03-22 VITALS — BP 113/63 | HR 80 | Temp 98.2°F | Ht 70.0 in | Wt >= 6400 oz

## 2017-03-22 DIAGNOSIS — Z9189 Other specified personal risk factors, not elsewhere classified: Secondary | ICD-10-CM

## 2017-03-22 DIAGNOSIS — Z6841 Body Mass Index (BMI) 40.0 and over, adult: Secondary | ICD-10-CM | POA: Diagnosis not present

## 2017-03-22 DIAGNOSIS — R7303 Prediabetes: Secondary | ICD-10-CM

## 2017-03-22 DIAGNOSIS — E559 Vitamin D deficiency, unspecified: Secondary | ICD-10-CM

## 2017-03-22 MED ORDER — METFORMIN HCL 500 MG PO TABS: 500.0000 mg | ORAL_TABLET | Freq: Every day | ORAL | 0 refills | Status: DC

## 2017-03-22 MED ORDER — VITAMIN D (ERGOCALCIFEROL) 1.25 MG (50000 UNIT) PO CAPS: 50000.0000 [IU] | ORAL_CAPSULE | ORAL | 0 refills | Status: DC

## 2017-03-26 NOTE — Progress Notes (Signed)
Office: [phone]  /  Fax: [phone]   HPI:   Chief Complaint: OBESITY Louis [name] is here to discuss his progress with his obesity treatment plan. He is on the Category 3 plan and is following his eating plan approximately 100 % of the time. He states he is exercising 0 minutes 0 times per week. [name] really enjoyed that he was able to stick to the plan. He is snacking on peanuts. [name] really forced himself to eat all the food. He is substituting breakfast for a protein shake. His weight is (!) 406 lb (184.2 kg) today and has maintained weight over a period of 2 weeks since his last visit. He has lost 0 lbs since starting treatment with [name].  Vitamin D deficiency Louis [name] has a diagnosis of vitamin D deficiency. Louis [name] has a vitamin D level; of 5.2 and he is not currently taking vit D. Louis [name] admits fatigue and denies nausea, vomiting or muscle weakness.   Pre-Diabetes Louis [name] has a diagnosis of pre-diabetes based on his elevated Hgb A1c of 5.9 and fasting insulin of 18.0 and was informed this puts him at greater risk of developing diabetes. He is not taking metformin currently and continues to work on diet and exercise to decrease risk of diabetes. He denies nausea or hypoglycemia.  At risk for diabetes Louis [name] is at higher than average risk for developing diabetes due to his obesity and pre-diabetes. He currently denies polyuria or polydipsia.  ALLERGIES: Allergies  Allergen Reactions  . No Known Allergies     MEDICATIONS: Current Outpatient Medications on File Prior to Visit  Medication Sig Dispense Refill  . acetaminophen (TYLENOL) 500 MG tablet Take 1,000 mg by mouth every 6 (six) hours as needed (for pain.).    Marland Kitchen diclofenac (VOLTAREN) 75 MG EC tablet TAKE 1 TABLET (75 MG TOTAL) BY MOUTH 2 (TWO) TIMES DAILY. 60 tablet 2  . losartan (COZAAR) 100 MG tablet TAKE 1 TABLET BY MOUTH EVERY DAY 90 tablet 0   No current facility-administered medications on file prior to visit.     PAST  MEDICAL HISTORY: Past Medical History:  Diagnosis Date  . Allergy   . Arthritis    in both knees  . GERD (gastroesophageal reflux disease)   . Headache   . HTN (hypertension)   . Obesity     PAST SURGICAL HISTORY: Past Surgical History:  Procedure Laterality Date  . ACHILLES TENDON REPAIR    . ANTERIOR CRUCIATE LIGAMENT REPAIR    . LUMBAR LAMINECTOMY/DECOMPRESSION MICRODISCECTOMY Right 09/19/2016   Procedure: Right Lumbar Five-Sacral one Laminotomy/Foraminotomy/removal of Synovial cyst;  Surgeon: [name], MD;  Location: [facility] OR;  Service: Neurosurgery;  Laterality: Right;  Right L5-S1 Laminotomy/Foraminotomy/removal of Synovial cyst  . POSTERIOR CERVICAL LAMINECTOMY N/A 06/12/2016   Procedure: Cervical One Posterior cervical laminectomy;  Surgeon: [name], MD;  Location: [facility] OR;  Service: Neurosurgery;  Laterality: N/A;  posterior  . TONSILLECTOMY      SOCIAL HISTORY: Social History   Tobacco Use  . Smoking status: Former Smoker    Packs/day: 0.25    Years: 3.00    Pack years: 0.75    Types: Cigarettes    Last attempt to quit: 06/14/2016    Years since quitting: 0.7  . Smokeless tobacco: Never Used  Substance Use Topics  . 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  Marland Kitchen  Drug use: No    FAMILY HISTORY: Family History  Problem Relation Age of Onset  . Diabetes Father   . Obesity Father     ROS: Review of Systems  Constitutional: Positive for malaise/fatigue. Negative for weight loss.  Gastrointestinal: Negative for nausea and vomiting.  Genitourinary: Negative for frequency.  Musculoskeletal:       Negative for muscle weakness  Endo/Heme/Allergies: Negative for polydipsia.       Negative for hypoglycemia    PHYSICAL EXAM: Blood pressure 113/63, pulse 80, temperature 98.2 F (36.8 C), temperature source Oral, height 5\' 10"  (1.778 m), weight (!) 406 lb (184.2 kg), SpO2 96  %. Body mass index is 58.25 kg/m. Physical Exam  Constitutional: He is oriented to person, place, and time. He appears well-developed and well-nourished.  Cardiovascular: Normal rate.  Pulmonary/Chest: Effort normal.  Musculoskeletal: Normal range of motion.  Neurological: He is oriented to person, place, and time.  Skin: Skin is warm and dry.  Psychiatric: He has a normal mood and affect. His behavior is normal.  Vitals reviewed.   RECENT LABS AND TESTS: BMET    Component Value Date/Time   NA 144 03/08/2017 0932   K 4.5 03/08/2017 0932   CL 102 03/08/2017 0932   CO2 25 03/08/2017 0932   GLUCOSE 97 03/08/2017 0932   GLUCOSE 102 (H) 09/14/2016 1140   BUN 12 03/08/2017 0932   CREATININE 0.99 03/08/2017 0932   CREATININE 0.94 11/15/2015 0833   CALCIUM 9.0 03/08/2017 0932   GFRNONAA 94 03/08/2017 0932   GFRNONAA >89 11/15/2015 0833   GFRAA 108 03/08/2017 0932   GFRAA >89 11/15/2015 0833   Lab Results  Component Value Date   HGBA1C 5.9 (H) 03/08/2017   HGBA1C 5.8 11/16/2015   Lab Results  Component Value Date   INSULIN 18.0 03/08/2017   CBC    Component Value Date/Time   WBC 8.6 03/08/2017 0932   WBC 8.8 09/14/2016 1140   RBC 4.63 03/08/2017 0932   RBC 4.89 09/14/2016 1140   HGB 13.2 03/08/2017 0932   HCT 41.5 03/08/2017 0932   PLT 285 03/08/2017 0932   MCV 90 03/08/2017 0932   MCH 28.5 03/08/2017 0932   MCH 27.8 09/14/2016 1140   MCHC 31.8 03/08/2017 0932   MCHC 31.8 09/14/2016 1140   RDW 14.7 03/08/2017 0932   LYMPHSABS 2.4 03/08/2017 0932   MONOABS 0.4 11/15/2015 0833   EOSABS 0.1 03/08/2017 0932   BASOSABS 0.0 03/08/2017 0932   Iron/TIBC/Ferritin/ %Sat No results found for: IRON, TIBC, FERRITIN, IRONPCTSAT Lipid Panel     Component Value Date/Time   CHOL 157 03/08/2017 0932   TRIG 68 03/08/2017 0932   HDL 58 03/08/2017 0932   CHOLHDL 2.7 03/08/2017 0932   CHOLHDL 3 11/16/2015 0808   VLDL 12.6 11/16/2015 0808   LDLCALC 85 03/08/2017 0932    Hepatic Function Panel     Component Value Date/Time   PROT 7.7 03/08/2017 0932   ALBUMIN 3.9 03/08/2017 0932   AST 31 03/08/2017 0932   ALT 37 03/08/2017 0932   ALKPHOS 79 03/08/2017 0932   BILITOT 0.3 03/08/2017 0932      Component Value Date/Time   TSH 1.260 03/08/2017 0932   TSH 1.07 11/15/2015 0833   Results for Janeece RiggersLEWIS, Kyjuan DAMON (MRN [id number]) as of 03/26/2017 09:25  Ref. Range 03/08/2017 09:32  Vitamin D, 25-Hydroxy Latest Ref Range: 30.0 - 100.0 ng/mL 5.2 (L)   ASSESSMENT AND PLAN: Vitamin D deficiency - Plan: Vitamin D, Ergocalciferol, (  DRISDOL) 50000 units CAPS capsule  Prediabetes - Plan: metFORMIN (GLUCOPHAGE) 500 MG tablet  At risk for diabetes mellitus  Class 3 severe obesity with serious comorbidity and body mass index (BMI) of 50.0 to 59.9 in adult, unspecified obesity type (HCC)  PLAN:  Vitamin D Deficiency Bowe was informed that low vitamin D levels contributes to fatigue and are associated with obesity, breast, and colon cancer. He agrees to start to take prescription Vit D @50 ,000 IU every week #4 with no refills and will follow up for routine testing of vitamin D, at least 2-3 times per year. He was informed of the risk of over-replacement of vitamin D and agrees to not increase his dose unless he discusses this with [name] first. Yafet agrees to follow up with our clinic in 2 weeks.  Pre-Diabetes Ty will continue to work on weight loss, exercise, and decreasing simple carbohydrates in his diet to help decrease the risk of diabetes. We dicussed metformin including benefits and risks. He was informed that eating too many simple carbohydrates or too many calories at one sitting increases the likelihood of GI side effects. Almin agrees to start to take metformin 500 mg by mouth daily with breakfast #30 with no refills and follow up with [name] as directed to monitor his progress.  Diabetes risk counseling Starsky was given extended (30 minutes) diabetes prevention  counseling today. He is 42 y.o. male and has risk factors for diabetes including obesity and pre-diabetes. We discussed intensive lifestyle modifications today with an emphasis on weight loss as well as increasing exercise and decreasing simple carbohydrates in his diet.  Obesity Esmeralda is currently in the action stage of change. As such, his goal is to continue with weight loss efforts He has agreed to follow the Category 4 plan Darsh has been instructed to work up to a goal of 150 minutes of combined cardio and strengthening exercise per week for weight loss and overall health benefits. We discussed the following Behavioral Modification Strategies today: planning for success, increasing lean protein intake and work on meal planning and easy cooking plans  Theoden has agreed to follow up with our clinic in 2 weeks. He was informed of the importance of frequent follow up visits to maximize his success with intensive lifestyle modifications for his multiple health conditions.   OBESITY BEHAVIORAL INTERVENTION VISIT  Today's visit was # 2 out of 22.  Starting weight: 406 lbs Starting date: 03/08/17 Today's weight : 406 lbs Today's date: 03/22/2017 Total lbs lost to date: 0 (Patients must lose 7 lbs in the first 6 months to continue with counseling)   ASK: We discussed the diagnosis of obesity with [name] today and Denny Peon agreed to give [name] permission to discuss obesity behavioral modification therapy today.  ASSESS: Rashid has the diagnosis of obesity and his BMI today is 58.25 Curvin is in the action stage of change   ADVISE: Hoyte was educated on the multiple health risks of obesity as well as the benefit of weight loss to improve his health. He was advised of the need for long term treatment and the importance of lifestyle modifications.  AGREE: Multiple dietary modification options and treatment options were discussed and  Sheila agreed to the above obesity treatment plan.  I,  Nevada Crane, am acting as transcriptionist for Filbert Schilder, MD  I have reviewed the above documentation for accuracy and completeness, and I agree with the above. - Debbra Riding, MD

## 2017-04-09 ENCOUNTER — Ambulatory Visit (INDEPENDENT_AMBULATORY_CARE_PROVIDER_SITE_OTHER): Payer: Managed Care, Other (non HMO) | Admitting: Family Medicine

## 2017-04-09 ENCOUNTER — Encounter (INDEPENDENT_AMBULATORY_CARE_PROVIDER_SITE_OTHER): Payer: Self-pay | Admitting: Family Medicine

## 2017-04-09 VITALS — BP 147/69 | HR 70 | Temp 98.6°F | Ht 70.0 in | Wt >= 6400 oz

## 2017-04-09 DIAGNOSIS — Z9189 Other specified personal risk factors, not elsewhere classified: Secondary | ICD-10-CM

## 2017-04-09 DIAGNOSIS — Z6841 Body Mass Index (BMI) 40.0 and over, adult: Secondary | ICD-10-CM | POA: Diagnosis not present

## 2017-04-09 DIAGNOSIS — E559 Vitamin D deficiency, unspecified: Secondary | ICD-10-CM | POA: Diagnosis not present

## 2017-04-09 DIAGNOSIS — R7303 Prediabetes: Secondary | ICD-10-CM

## 2017-04-09 MED ORDER — VITAMIN D (ERGOCALCIFEROL) 1.25 MG (50000 UNIT) PO CAPS: 50000.0000 [IU] | ORAL_CAPSULE | ORAL | 0 refills | Status: DC

## 2017-04-09 NOTE — Progress Notes (Signed)
Office: [phone]  /  Fax: [phone]   HPI:   Chief Complaint: OBESITY Louis [name] is here to discuss his progress with his obesity treatment plan. He is on the Category 4 plan and is following his eating plan approximately 90 % of the time. He states he is exercising 60 minutes 4 times per week. [name] has gotten better at bringing food to work. Still doesn't like yogurt, so struggling with eating yogurt at breakfast. Doing well with getting dinner protein in.  His weight is (!) 404 lb (183.3 kg) today and has had a weight loss of 2 pounds over a period of 2 to 3 weeks since his last visit. He has lost 2 lbs since starting treatment with [name].  Vitamin D Deficiency [name] has a diagnosis of vitamin D deficiency. He is currently taking prescription Vit D and denies nausea, vomiting or muscle weakness.  At risk for osteopenia and osteoporosis [name] is at higher risk of osteopenia and osteoporosis due to vitamin D deficiency.   Pre-Diabetes [name] has a diagnosis of pre-diabetes based on his elevated Hgb A1c and was informed this puts him at greater risk of developing diabetes. He is taking metformin currently and continues to work on diet and exercise to decrease risk of diabetes. He has no GI side effects currently but occasional GI upset, trying to figure out the best time to take it. He denies nausea or hypoglycemia.  ALLERGIES: Allergies  Allergen Reactions  . No Known Allergies     MEDICATIONS: Current Outpatient Medications on File Prior to Visit  Medication Sig Dispense Refill  . acetaminophen (TYLENOL) 500 MG tablet Take 1,000 mg by mouth every 6 (six) hours as needed (for pain.).    Marland Kitchen diclofenac (VOLTAREN) 75 MG EC tablet TAKE 1 TABLET (75 MG TOTAL) BY MOUTH 2 (TWO) TIMES DAILY. 60 tablet 2  . losartan (COZAAR) 100 MG tablet TAKE 1 TABLET BY MOUTH EVERY DAY 90 tablet 0  . metFORMIN (GLUCOPHAGE) 500 MG tablet Take 1 tablet (500 mg total) by mouth daily with breakfast. 30 tablet 0   . Vitamin D, Ergocalciferol, (DRISDOL) 50000 units CAPS capsule Take 1 capsule (50,000 Units total) by mouth every 7 (seven) days. 4 capsule 0   No current facility-administered medications on file prior to visit.     PAST MEDICAL HISTORY: Past Medical History:  Diagnosis Date  . Allergy   . Arthritis    in both knees  . GERD (gastroesophageal reflux disease)   . Headache   . HTN (hypertension)   . Obesity     PAST SURGICAL HISTORY: Past Surgical History:  Procedure Laterality Date  . ACHILLES TENDON REPAIR    . ANTERIOR CRUCIATE LIGAMENT REPAIR    . LUMBAR LAMINECTOMY/DECOMPRESSION MICRODISCECTOMY Right 09/19/2016   Procedure: Right Lumbar Five-Sacral one Laminotomy/Foraminotomy/removal of Synovial cyst;  Surgeon: [name], MD;  Location: [facility] OR;  Service: Neurosurgery;  Laterality: Right;  Right L5-S1 Laminotomy/Foraminotomy/removal of Synovial cyst  . POSTERIOR CERVICAL LAMINECTOMY N/A 06/12/2016   Procedure: Cervical One Posterior cervical laminectomy;  Surgeon: [name], MD;  Location: [facility] OR;  Service: Neurosurgery;  Laterality: N/A;  posterior  . TONSILLECTOMY      SOCIAL HISTORY: Social History   Tobacco Use  . Smoking status: Former Smoker    Packs/day: 0.25    Years: 3.00    Pack years: 0.75    Types: Cigarettes    Last attempt to quit: 06/14/2016    Years since quitting: 0.8  . Smokeless  tobacco: Never Used  Substance Use Topics  . 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  . Drug use: No    FAMILY HISTORY: Family History  Problem Relation Age of Onset  . Diabetes Father   . Obesity Father     ROS: Review of Systems  Constitutional: Positive for weight loss.  Gastrointestinal: Negative for nausea and vomiting.  Musculoskeletal:       Negative muscle weakness  Endo/Heme/Allergies:       Negative hypoglycemia    PHYSICAL EXAM: Blood pressure (!) 147/69,  pulse 70, temperature 98.6 F (37 C), temperature source Oral, height 5\' 10"  (1.778 m), weight (!) 404 lb (183.3 kg), SpO2 96 %. Body mass index is 57.97 kg/m. Physical Exam  Constitutional: He is oriented to person, place, and time. He appears well-developed and well-nourished.  Cardiovascular: Normal rate.  Pulmonary/Chest: Effort normal.  Musculoskeletal: Normal range of motion.  Neurological: He is oriented to person, place, and time.  Skin: Skin is warm and dry.  Psychiatric: He has a normal mood and affect. His behavior is normal.  Vitals reviewed.   RECENT LABS AND TESTS: BMET    Component Value Date/Time   NA 144 03/08/2017 0932   K 4.5 03/08/2017 0932   CL 102 03/08/2017 0932   CO2 25 03/08/2017 0932   GLUCOSE 97 03/08/2017 0932   GLUCOSE 102 (H) 09/14/2016 1140   BUN 12 03/08/2017 0932   CREATININE 0.99 03/08/2017 0932   CREATININE 0.94 11/15/2015 0833   CALCIUM 9.0 03/08/2017 0932   GFRNONAA 94 03/08/2017 0932   GFRNONAA >89 11/15/2015 0833   GFRAA 108 03/08/2017 0932   GFRAA >89 11/15/2015 0833   Lab Results  Component Value Date   HGBA1C 5.9 (H) 03/08/2017   HGBA1C 5.8 11/16/2015   Lab Results  Component Value Date   INSULIN 18.0 03/08/2017   CBC    Component Value Date/Time   WBC 8.6 03/08/2017 0932   WBC 8.8 09/14/2016 1140   RBC 4.63 03/08/2017 0932   RBC 4.89 09/14/2016 1140   HGB 13.2 03/08/2017 0932   HCT 41.5 03/08/2017 0932   PLT 285 03/08/2017 0932   MCV 90 03/08/2017 0932   MCH 28.5 03/08/2017 0932   MCH 27.8 09/14/2016 1140   MCHC 31.8 03/08/2017 0932   MCHC 31.8 09/14/2016 1140   RDW 14.7 03/08/2017 0932   LYMPHSABS 2.4 03/08/2017 0932   MONOABS 0.4 11/15/2015 0833   EOSABS 0.1 03/08/2017 0932   BASOSABS 0.0 03/08/2017 0932   Iron/TIBC/Ferritin/ %Sat No results found for: IRON, TIBC, FERRITIN, IRONPCTSAT Lipid Panel     Component Value Date/Time   CHOL 157 03/08/2017 0932   TRIG 68 03/08/2017 0932   HDL 58 03/08/2017 0932    CHOLHDL 2.7 03/08/2017 0932   CHOLHDL 3 11/16/2015 0808   VLDL 12.6 11/16/2015 0808   LDLCALC 85 03/08/2017 0932   Hepatic Function Panel     Component Value Date/Time   PROT 7.7 03/08/2017 0932   ALBUMIN 3.9 03/08/2017 0932   AST 31 03/08/2017 0932   ALT 37 03/08/2017 0932   ALKPHOS 79 03/08/2017 0932   BILITOT 0.3 03/08/2017 0932      Component Value Date/Time   TSH 1.260 03/08/2017 0932   TSH 1.07 11/15/2015 0833  Results for JAHSHUA, BONITO DAMON (MRN [id number]) as of 04/09/2017 09:43  Ref.  Range 03/08/2017 09:32  Vitamin D, 25-Hydroxy Latest Ref Range: 30.0 - 100.0 ng/mL 5.2 (L)    ASSESSMENT AND PLAN: Vitamin D deficiency - Plan: Vitamin D, Ergocalciferol, (DRISDOL) 50000 units CAPS capsule  Prediabetes  At risk for osteoporosis  Class 3 severe obesity with serious comorbidity and body mass index (BMI) of 50.0 to 59.9 in adult, unspecified obesity type (HCC)  PLAN:  Vitamin D Deficiency Louis [name] was informed that low vitamin D levels contributes to fatigue and are associated with obesity, breast, and colon cancer. Louis [name] agrees to continue taking prescription Vit D @50 ,000 IU every week #4 and we will refill for 1 month. He will follow up for routine testing of vitamin D, at least 2-3 times per year. He was informed of the risk of over-replacement of vitamin D and agrees to not increase his dose unless he discusses this with us first. Louis [name] agrees to follow up with our clinic in 2 weeks. Louis [name] agrees to follow up with our clinic in 2 weeks.  At risk for osteopenia and osteoporosis Louis [name] is at risk for osteopenia and osteoporsis due to his vitamin D deficiency. He was encouraged to take his vitamin D and follow his higher calcium diet and increase strengthening exercise to help strengthen his bones and decrease his risk of osteopenia and osteoporosis.  Pre-Diabetes Louis [name] will continue to work on weight loss, exercise, and decreasing simple carbohydrates in his diet to help  decrease the risk of diabetes. We dicussed metformin including benefits and risks. He was informed that eating too many simple carbohydrates or too many calories at one sitting increases the likelihood of GI side effects. Louis [name] agrees to continue taking metformin 500 mg PO q daily, no refill needed. Louis [name] agrees to follow up with our clinic in 2 weeks as directed to monitor his progress.  Obesity Louis [name] is currently in the action stage of change. As such, his goal is to continue with weight loss efforts He has agreed to follow the Category 4 plan Louis [name] has been instructed to work up to a goal of 150 minutes of combined cardio and strengthening exercise per week for weight loss and overall health benefits. We discussed the following Behavioral Modification Strategies today: increasing lean protein intake, decreasing simple carbohydrates, work on meal planning and easy cooking plans, no skipping meals, and better snacking choices.   Louis [name] has agreed to follow up with our clinic in 2 weeks. He was informed of the importance of frequent follow up visits to maximize his success with intensive lifestyle modifications for his multiple health conditions.   OBESITY BEHAVIORAL INTERVENTION VISIT  Today's visit was # 3 out of 22.  Starting weight: 406 lbs Starting date: 03/08/17 Today's weight : 404 lbs  Today's date: 04/09/2017 Total lbs lost to date: 2 (Patients must lose 7 lbs in the first 6 months to continue with counseling)   ASK: We discussed the diagnosis of obesity with Lonell GrandchildAvery Damon Simoneau today and Louis PeonAvery agreed to give us permission to discuss obesity behavioral modification therapy today.  ASSESS: Louis [name] has the diagnosis of obesity and his BMI today is 57.97 Louis [name] is in the action stage of change   ADVISE: Louis [name] was educated on the multiple health risks of obesity as well as the benefit of weight loss to improve his health. He was advised of the need for long term treatment and the  importance of lifestyle modifications.  AGREE: Multiple dietary modification options and treatment options were discussed and  Louis [name] agreed to  the above obesity treatment plan.  I, Burt Knack, am acting as transcriptionist for Debbra Riding, MD  I have reviewed the above documentation for accuracy and completeness, and I agree with the above. - Debbra Riding, MD

## 2017-04-30 ENCOUNTER — Encounter: Payer: Self-pay | Admitting: Neurology

## 2017-04-30 ENCOUNTER — Ambulatory Visit (INDEPENDENT_AMBULATORY_CARE_PROVIDER_SITE_OTHER): Payer: Managed Care, Other (non HMO) | Admitting: Family Medicine

## 2017-04-30 ENCOUNTER — Ambulatory Visit (INDEPENDENT_AMBULATORY_CARE_PROVIDER_SITE_OTHER): Payer: Managed Care, Other (non HMO) | Admitting: Neurology

## 2017-04-30 VITALS — BP 163/96 | HR 67 | Ht 70.0 in | Wt >= 6400 oz

## 2017-04-30 VITALS — BP 122/81 | HR 71 | Temp 97.8°F | Ht 70.0 in | Wt >= 6400 oz

## 2017-04-30 DIAGNOSIS — Z6841 Body Mass Index (BMI) 40.0 and over, adult: Secondary | ICD-10-CM

## 2017-04-30 DIAGNOSIS — R51 Headache: Secondary | ICD-10-CM | POA: Diagnosis not present

## 2017-04-30 DIAGNOSIS — Z9189 Other specified personal risk factors, not elsewhere classified: Secondary | ICD-10-CM | POA: Diagnosis not present

## 2017-04-30 DIAGNOSIS — R519 Headache, unspecified: Secondary | ICD-10-CM

## 2017-04-30 DIAGNOSIS — R351 Nocturia: Secondary | ICD-10-CM | POA: Diagnosis not present

## 2017-04-30 DIAGNOSIS — I10 Essential (primary) hypertension: Secondary | ICD-10-CM | POA: Diagnosis not present

## 2017-04-30 DIAGNOSIS — G4733 Obstructive sleep apnea (adult) (pediatric): Secondary | ICD-10-CM | POA: Diagnosis not present

## 2017-04-30 DIAGNOSIS — R7303 Prediabetes: Secondary | ICD-10-CM

## 2017-04-30 DIAGNOSIS — R0683 Snoring: Secondary | ICD-10-CM | POA: Diagnosis not present

## 2017-04-30 DIAGNOSIS — G4719 Other hypersomnia: Secondary | ICD-10-CM | POA: Diagnosis not present

## 2017-04-30 MED ORDER — METFORMIN HCL 500 MG PO TABS: 500.0000 mg | ORAL_TABLET | Freq: Every day | ORAL | 0 refills | Status: DC

## 2017-04-30 NOTE — Progress Notes (Signed)
Subjective:    Patient ID: Louis [name] is a 42 y.o. male.  HPI     Louis Foley, MD, PhD [facility] [address] [address] [place], [place] [address]  Dear Dr. Rinaldo Ratel,   I saw your patient, Louis [name], upon your kind request, in my neurologic clinic today for initial consultation of his sleep disorder, in particular, concern for underlying obstructive sleep apnea. The patient is accompanied by his wife today. As you know, Louis [name] is a 42 year old right-handed gentleman with an underlying medical history of hypertension, reflux disease, arthritis, allergies, recurrent headaches, status post neck and lower back surgery, smoking with recent cessation, and morbid obesity with a BMI of over 50, who reports snoring and excessive daytime somnolence as well as shortness of breath. I reviewed your office note from 04/09/2017. His Epworth sleepiness score is 12 out of 24 today, fatigue score is 14 out of 63. He is married and lives with his wife. They have 7 children between the 2 of them, he has 3 biological children. He quit smoking last year, very rare EtOH and used to drink heavily, by self-admission.  His bedtime is generally between 9:30 and 10, rise time is early around 4:30. He has continued. He works in [employer]. When sedentary, he may have a tendency to doze off, he has never fallen asleep wheel. He had a tonsillectomy and adenoidectomy as a child, has snored even in childhood. His weight loss has stagnated he feels. He has considered weight loss surgery. He has nocturia about twice per average night and has recurrent morning headaches which he describes as dull and achy, he used to have migraines which were different. He has had witnessed apneas per wife's report.  His Past Medical History Is Significant For: Past Medical History:  Diagnosis Date  . Allergy   . Arthritis    in both knees  . GERD (gastroesophageal reflux disease)   . Headache    . HTN (hypertension)   . Obesity     His Past Surgical History Is Significant For: Past Surgical History:  Procedure Laterality Date  . ACHILLES TENDON REPAIR    . ANTERIOR CRUCIATE LIGAMENT REPAIR    . LUMBAR LAMINECTOMY/DECOMPRESSION MICRODISCECTOMY Right 09/19/2016   Procedure: Right Lumbar Five-Sacral one Laminotomy/Foraminotomy/removal of Synovial cyst;  Surgeon: [name], MD;  Location: [facility] OR;  Service: Neurosurgery;  Laterality: Right;  Right L5-S1 Laminotomy/Foraminotomy/removal of Synovial cyst  . POSTERIOR CERVICAL LAMINECTOMY N/A 06/12/2016   Procedure: Cervical One Posterior cervical laminectomy;  Surgeon: [name], MD;  Location: [facility] OR;  Service: Neurosurgery;  Laterality: N/A;  posterior  . TONSILLECTOMY      His Family History Is Significant For: Family History  Problem Relation Age of Onset  . Diabetes Father   . Obesity Father     His Social History Is Significant For: Social History   Socioeconomic History  . Marital status: Married    Spouse name: Louis [name]  . Number of children: 7  . Years of education: Not on file  . Highest education level: Not on file  Occupational History  . Occupation: Corporate investment banker  Social Needs  . Financial resource strain: Not on file  . Food insecurity:    Worry: Not on file    Inability: Not on file  . Transportation needs:    Medical: Not on file    Non-medical: Not on file  Tobacco Use  . Smoking status: Former Smoker  Packs/day: 0.25    Years: 3.00    Pack years: 0.75    Types: Cigarettes    Last attempt to quit: 06/14/2016    Years since quitting: 0.8  . Smokeless tobacco: Never Used  Substance and Sexual Activity  . 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  . Drug use: No  . Sexual activity: Yes  Lifestyle  . Physical activity:    Days per week: Not on file    Minutes per session: Not on file  . Stress:  Not on file  Relationships  . Social connections:    Talks on phone: Not on file    Gets together: Not on file    Attends religious service: Not on file    Active member of club or organization: Not on file    Attends meetings of clubs or organizations: Not on file    Relationship status: Not on file  Other Topics Concern  . Not on file  Social History Narrative  . Not on file    His Allergies Are:  Allergies  Allergen Reactions  . No Known Allergies   :   His Current Medications Are:  Outpatient Encounter Medications as of 04/30/2017  Medication Sig  . acetaminophen (TYLENOL) 500 MG tablet Take 1,000 mg by mouth every 6 (six) hours as needed (for pain.).  Marland Kitchen diclofenac (VOLTAREN) 75 MG EC tablet TAKE 1 TABLET (75 MG TOTAL) BY MOUTH 2 (TWO) TIMES DAILY.  Marland Kitchen losartan (COZAAR) 100 MG tablet TAKE 1 TABLET BY MOUTH EVERY DAY  . metFORMIN (GLUCOPHAGE) 500 MG tablet Take 1 tablet (500 mg total) by mouth daily with breakfast.  . Vitamin D, Ergocalciferol, (DRISDOL) 50000 units CAPS capsule Take 1 capsule (50,000 Units total) by mouth every 7 (seven) days.  . [DISCONTINUED] metFORMIN (GLUCOPHAGE) 500 MG tablet Take 1 tablet (500 mg total) by mouth daily with breakfast.   No facility-administered encounter medications on file as of 04/30/2017.   :  Review of Systems:  Out of a complete 14 point review of systems, all are reviewed and negative with the exception of these symptoms as listed below: Review of Systems  Neurological:       Epworth Sleepiness Scale 0= would never doze 1= slight chance of dozing 2= moderate chance of dozing 3= high chance of dozing  Sitting and reading: 2 Watching TV:2 Sitting inactive in a public place (ex. Theater or meeting):1 As a passenger in a car for an hour without a break:1 Lying down to rest in the afternoon:3 Sitting and talking to someone:1 Sitting quietly after lunch (no alcohol):2 In a car, while stopped in traffic:0 Total:12      Objective:  Neurological Exam  Physical Exam Physical Examination:   Vitals:   04/30/17 1601  BP: (!) 163/96  Pulse: 67    General Examination: The patient is a very pleasant 42 y.o. male in no acute distress. He appears well-developed and well-nourished and well groomed.   HEENT: Normocephalic, atraumatic, pupils are equal, round and reactive to light and accommodation. Extraocular tracking is good without limitation to gaze excursion or nystagmus noted. Normal smooth pursuit is noted. Hearing is grossly intact. Face is symmetric with normal facial animation and normal facial sensation. Speech is clear with no dysarthria noted. There is no hypophonia. There is no lip, neck/head, jaw or voice tremor. Neck  is supple with full range of passive and active motion. There are no carotid bruits on auscultation. Oropharynx exam reveals: mild mouth dryness, adequate dental hygiene and moderate airway crowding, due to small airway entry and redundant soft palate. Uvula is small, tonsils are absent, Mallampati is class III. Neck circumference is 19 inches. Scars over the nasal bridge, reports having fractured his nose 3 times in the past, no significant deviated septum noted.   Chest: Clear to auscultation without wheezing, rhonchi or crackles noted.  Heart: S1+S2+0, regular and normal without murmurs, rubs or gallops noted.   Abdomen: Soft, non-tender and non-distended with normal bowel sounds appreciated on auscultation.  Extremities: There is no pitting edema in the distal lower extremities bilaterally.   Skin: Warm and dry without trophic changes noted.  Musculoskeletal: exam reveals no obvious joint deformities, tenderness or joint swelling or erythema.   Neurologically:  Mental status: The patient is awake, alert and oriented in all 4 spheres. His immediate and remote memory, attention, language skills and fund of knowledge are appropriate. There is no evidence of aphasia, agnosia,  apraxia or anomia. Speech is clear with normal prosody and enunciation. Thought process is linear. Mood is normal and affect is normal.  Cranial nerves II - XII are as described above under HEENT exam. In addition: shoulder shrug is normal with equal shoulder height noted. Motor exam: Normal bulk, strength and tone is noted. There is no tremor. Fine motor skills and coordination: grossly intact.  Cerebellar testing: No dysmetria or intention tremor on finger to nose testing. Heel to shin is unremarkable bilaterally. There is no truncal or gait ataxia.  Sensory exam: intact to light touch in the upper and lower extremities.  Gait, station and balance: He stands easily. No veering to one side is noted. No leaning to one side is noted. Posture is age-appropriate and stance is narrow based. Gait shows normal stride length and normal pace.   Assessment and Plan:   In summary, Amedeo Detweiler is a very pleasant 42 y.o.-year old male with an underlying medical history of hypertension, reflux disease, arthritis, allergies, recurrent headaches, status post neck and lower back surgery, recent smoking cessation, and morbid obesity with a BMI of over 50, whose history and physical exam are concerning for obstructive sleep apnea (OSA). I had a long chat with the patient and his wife about my findings and the diagnosis of OSA, its prognosis and treatment options. We talked about medical treatments, surgical interventions and non-pharmacological approaches. I explained in particular the risks and ramifications of untreated moderate to severe OSA, especially with respect to developing cardiovascular disease down the Road, including congestive heart failure, difficult to treat hypertension, cardiac arrhythmias, or stroke. Even type 2 diabetes has, in part, been linked to untreated OSA. Symptoms of untreated OSA include daytime sleepiness, memory problems, mood irritability and mood disorder such as depression and  anxiety, lack of energy, as well as recurrent headaches, especially morning headaches. We talked about trying to maintain a healthy lifestyle in general, as well as the importance of weight control. I encouraged the patient to eat healthy, exercise daily and keep well hydrated, to keep a scheduled bedtime and wake time routine, to not skip any meals and eat healthy snacks in between meals. I advised the patient not to drive when feeling sleepy. I recommended the following at this time: sleep study with potential positive airway pressure titration. (We will score hypopneas at 4%).   I explained the sleep test procedure  to the patient and also outlined possible surgical and non-surgical treatment options of OSA, including the use of a custom-made dental device (which would require a referral to a specialist dentist or oral surgeon), upper airway surgical options, such as pillar implants, radiofrequency surgery, tongue base surgery, and UPPP (which would involve a referral to an ENT surgeon). Rarely, jaw surgery such as mandibular advancement may be considered.  I also explained the CPAP treatment option to the patient, who indicated that he would be willing to try CPAP if the need arises. I explained the importance of being compliant with PAP treatment, not only for insurance purposes but primarily to improve His symptoms, and for the patient's long term health benefit, including to reduce His cardiovascular risks. I answered all their questions today and the patient and his wife were in agreement. I would like to see him back after the sleep study is completed and encouraged him to call with any interim questions, concerns, problems or updates.   Thank you very much for allowing me to participate in the care of this nice patient. If I can be of any further assistance to you please do not hesitate to call me at [phone].  Sincerely,   Louis Foley, MD, PhD

## 2017-04-30 NOTE — Patient Instructions (Signed)

## 2017-05-02 NOTE — Progress Notes (Signed)
Office: [phone]  /  Fax: [phone]   HPI:   Chief Complaint: OBESITY Louis [name] is here to discuss his progress with his obesity treatment plan. He is on the Category 4 plan and is following his eating plan approximately 90 % of the time. He states he is walking for 45-60 minutes 2 times per week. [name] trying to get all food in, struggling with getting in yogurt and cheese. Dinner and lunch are doable.  His weight is (!) 408 lb (185.1 kg) today and has gained 4 pounds since his last visit. He has lost 0 lbs since starting treatment with [name].  Pre-Diabetes [name] has a diagnosis of pre-diabetes based on his elevated Hgb A1c and was informed this puts him at greater risk of developing diabetes. He is taking metformin currently and continues to work on diet and exercise to decrease risk of diabetes. He denies GI upset, nausea or hypoglycemia.  At risk for diabetes [name] is at higher than average risk for developing diabetes due to his obesity and pre-diabetes. He currently denies polyuria or polydipsia.  Obstructive Sleep Apnea [name] has a history snoring and occasional apnea.  Hypertension [name] is a 42 y.o. male with hypertension. [name]'s blood pressure is controlled today. He denies chest pain, chest pressure, or headache. He is working weight loss to help control his blood pressure with the goal of decreasing his risk of heart attack and stroke.  ALLERGIES: Allergies  Allergen Reactions  . No Known Allergies     MEDICATIONS: Current Outpatient Medications on File Prior to Visit  Medication Sig Dispense Refill  . acetaminophen (TYLENOL) 500 MG tablet Take 1,000 mg by mouth every 6 (six) hours as needed (for pain.).    Marland Kitchen diclofenac (VOLTAREN) 75 MG EC tablet TAKE 1 TABLET (75 MG TOTAL) BY MOUTH 2 (TWO) TIMES DAILY. 60 tablet 2  . losartan (COZAAR) 100 MG tablet TAKE 1 TABLET BY MOUTH EVERY DAY 90 tablet 0  . Vitamin D, Ergocalciferol, (DRISDOL) 50000 units CAPS  capsule Take 1 capsule (50,000 Units total) by mouth every 7 (seven) days. 4 capsule 0   No current facility-administered medications on file prior to visit.     PAST MEDICAL HISTORY: Past Medical History:  Diagnosis Date  . Allergy   . Arthritis    in both knees  . GERD (gastroesophageal reflux disease)   . Headache   . HTN (hypertension)   . Obesity     PAST SURGICAL HISTORY: Past Surgical History:  Procedure Laterality Date  . ACHILLES TENDON REPAIR    . ANTERIOR CRUCIATE LIGAMENT REPAIR    . LUMBAR LAMINECTOMY/DECOMPRESSION MICRODISCECTOMY Right 09/19/2016   Procedure: Right Lumbar Five-Sacral one Laminotomy/Foraminotomy/removal of Synovial cyst;  Surgeon: [name], MD;  Location: [facility] OR;  Service: Neurosurgery;  Laterality: Right;  Right L5-S1 Laminotomy/Foraminotomy/removal of Synovial cyst  . POSTERIOR CERVICAL LAMINECTOMY N/A 06/12/2016   Procedure: Cervical One Posterior cervical laminectomy;  Surgeon: [name], MD;  Location: [facility] OR;  Service: Neurosurgery;  Laterality: N/A;  posterior  . TONSILLECTOMY      SOCIAL HISTORY: Social History   Tobacco Use  . Smoking status: Former Smoker    Packs/day: 0.25    Years: 3.00    Pack years: 0.75    Types: Cigarettes    Last attempt to quit: 06/14/2016    Years since quitting: 0.8  . Smokeless tobacco: Never Used  Substance Use Topics  . 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  . Drug use: No    FAMILY HISTORY: Family History  Problem Relation Age of Onset  . Diabetes Father   . Obesity Father     ROS: Review of Systems  Constitutional: Negative for weight loss.  Cardiovascular: Negative for chest pain.       Negative chest pressure  Genitourinary: Negative for frequency.  Neurological: Negative for headaches.  Endo/Heme/Allergies: Negative for polydipsia.    PHYSICAL EXAM: Blood pressure 122/81, pulse 71,  temperature 97.8 F (36.6 C), height  (1.778 m), weight (!) 408 lb (185.1 kg), SpO2 95 %. Body mass index is 58.54 kg/m. Physical Exam  Constitutional: He is oriented to person, place, and time. He appears well-developed and well-nourished.  Cardiovascular: Normal rate.  Pulmonary/Chest: Effort normal.  Musculoskeletal: Normal range of motion.  Neurological: He is oriented to person, place, and time.  Skin: Skin is warm and dry.  Psychiatric: He has a normal mood and affect. His behavior is normal.  Vitals reviewed.   RECENT LABS AND TESTS: BMET    Component Value Date/Time   NA 144 03/08/2017 0932   K 4.5 03/08/2017 0932   CL 102 03/08/2017 0932   CO2 25 03/08/2017 0932   GLUCOSE 97 03/08/2017 0932   GLUCOSE 102 (H) 09/14/2016 1140   BUN 12 03/08/2017 0932   CREATININE 0.99 03/08/2017 0932   CREATININE 0.94 11/15/2015 0833   CALCIUM 9.0 03/08/2017 0932   GFRNONAA 94 03/08/2017 0932   GFRNONAA >89 11/15/2015 0833   GFRAA 108 03/08/2017 0932   GFRAA >89 11/15/2015 0833   Lab Results  Component Value Date   HGBA1C 5.9 (H) 03/08/2017   HGBA1C 5.8 11/16/2015   Lab Results  Component Value Date   INSULIN 18.0 03/08/2017   CBC    Component Value Date/Time   WBC 8.6 03/08/2017 0932   WBC 8.8 09/14/2016 1140   RBC 4.63 03/08/2017 0932   RBC 4.89 09/14/2016 1140   HGB 13.2 03/08/2017 0932   HCT 41.5 03/08/2017 0932   PLT 285 03/08/2017 0932   MCV 90 03/08/2017 0932   MCH 28.5 03/08/2017 0932   MCH 27.8 09/14/2016 1140   MCHC 31.8 03/08/2017 0932   MCHC 31.8 09/14/2016 1140   RDW 14.7 03/08/2017 0932   LYMPHSABS 2.4 03/08/2017 0932   MONOABS 0.4 11/15/2015 0833   EOSABS 0.1 03/08/2017 0932   BASOSABS 0.0 03/08/2017 0932   Iron/TIBC/Ferritin/ %Sat No results found for: IRON, TIBC, FERRITIN, IRONPCTSAT Lipid Panel     Component Value Date/Time   CHOL 157 03/08/2017 0932   TRIG 68 03/08/2017 0932   HDL 58 03/08/2017 0932   CHOLHDL 2.7 03/08/2017 0932     CHOLHDL 3 11/16/2015 0808   VLDL 12.6 11/16/2015 0808   LDLCALC 85 03/08/2017 0932   Hepatic Function Panel     Component Value Date/Time   PROT 7.7 03/08/2017 0932   ALBUMIN 3.9 03/08/2017 0932   AST 31 03/08/2017 0932   ALT 37 03/08/2017 0932   ALKPHOS 79 03/08/2017 0932   BILITOT 0.3 03/08/2017 0932      Component Value Date/Time   TSH 1.260 03/08/2017 0932   TSH 1.07 11/15/2015 0833    ASSESSMENT AND PLAN: Prediabetes - Plan: metFORMIN (GLUCOPHAGE) 500 MG tablet  OSA (obstructive sleep apnea)  Essential hypertension  At risk for diabetes mellitus  Class 3 severe obesity with serious comorbidity  and body mass index (BMI) of 50.0 to 59.9 in adult, unspecified obesity type [facility])  PLAN:  Pre-Diabetes Hulbert will continue to work on weight loss, exercise, and decreasing simple carbohydrates in his diet to help decrease the risk of diabetes. We dicussed metformin including benefits and risks. He was informed that eating too many simple carbohydrates or too many calories at one sitting increases the likelihood of GI side effects. Bralynn agrees to continue taking metformin 500 mg PO q AM #30 and we will refill for 1 month. Vandell agrees to follow up with our clinic in 2 weeks as directed to monitor his progress.  Diabetes risk counselling Kashton was given extended (15 minutes) diabetes prevention counseling today. He is 42 y.o. male and has risk factors for diabetes including obesity. We discussed intensive lifestyle modifications today with an emphasis on weight loss as well as increasing exercise and decreasing simple carbohydrates in his diet.  Obstructive Sleep Apnea We will order a sleep study consult today and Melbourne agrees to follow up with our clinic in 2 weeks.  Hypertension We discussed sodium restriction, working on healthy weight loss, and a regular exercise program as the means to achieve improved blood pressure control. Blaire agreed with this plan and agreed to  follow up as directed. We will continue to monitor his blood pressure as well as his progress with the above lifestyle modifications. He will continue losartan and will watch for signs of hypotension as he continues his lifestyle modifications. Stancil agrees to follow up with our clinic in 2 weeks.  Obesity Herbie is currently in the action stage of change. As such, his goal is to continue with weight loss efforts He has agreed to keep a food journal with 300-400 calories and 30 grams of protein at breakfast daily and follow the Category 4 plan Bronislaw has been instructed to work up to a goal of 150 minutes of combined cardio and strengthening exercise per week for weight loss and overall health benefits. We discussed the following Behavioral Modification Strategies today: increasing lean protein intake, work on meal planning and easy cooking plans, increase H20 intake, and keep a strict food journal   Lafayette has agreed to follow up with our clinic in 2 weeks. He was informed of the importance of frequent follow up visits to maximize his success with intensive lifestyle modifications for his multiple health conditions.   OBESITY BEHAVIORAL INTERVENTION VISIT  Today's visit was # 4 out of 22.  Starting weight: 406 lbs Starting date: 03/08/17 Today's weight : 408 lbs  Today's date: 04/30/2017 Total lbs lost to date: 0 (Patients must lose 7 lbs in the first 6 months to continue with counseling)   ASK: We discussed the diagnosis of obesity with [name] today and Denny Peon agreed to give [name] permission to discuss obesity behavioral modification therapy today.  ASSESS: Masaru has the diagnosis of obesity and his BMI today is 58.54 Aarron is in the action stage of change   ADVISE: Oseph was educated on the multiple health risks of obesity as well as the benefit of weight loss to improve his health. He was advised of the need for long term treatment and the importance of lifestyle  modifications.  AGREE: Multiple dietary modification options and treatment options were discussed and  Kowen agreed to the above obesity treatment plan.  I, Burt Knack, am acting as transcriptionist for Debbra Riding, MD  I have reviewed the above documentation for accuracy and completeness, and I agree  with the above. - Ilene Qua, MD

## 2017-05-07 ENCOUNTER — Telehealth: Payer: Self-pay | Admitting: Neurology

## 2017-05-07 DIAGNOSIS — G4719 Other hypersomnia: Secondary | ICD-10-CM

## 2017-05-07 NOTE — Telephone Encounter (Signed)
[employer] denied in lab study. Please place order for hst.

## 2017-05-07 NOTE — Telephone Encounter (Signed)
HST order placed. 

## 2017-05-07 NOTE — Addendum Note (Signed)
Addended by: Geronimo Running A on: 05/07/2017 11:30 AM   Modules accepted: Orders

## 2017-05-11 ENCOUNTER — Other Ambulatory Visit: Payer: Self-pay | Admitting: Medical

## 2017-05-14 ENCOUNTER — Ambulatory Visit (INDEPENDENT_AMBULATORY_CARE_PROVIDER_SITE_OTHER): Payer: Managed Care, Other (non HMO) | Admitting: Family Medicine

## 2017-05-14 VITALS — BP 166/82 | HR 68 | Temp 98.3°F | Ht 70.0 in | Wt >= 6400 oz

## 2017-05-14 DIAGNOSIS — E559 Vitamin D deficiency, unspecified: Secondary | ICD-10-CM | POA: Diagnosis not present

## 2017-05-14 DIAGNOSIS — I10 Essential (primary) hypertension: Secondary | ICD-10-CM

## 2017-05-14 DIAGNOSIS — R7303 Prediabetes: Secondary | ICD-10-CM

## 2017-05-14 DIAGNOSIS — Z9189 Other specified personal risk factors, not elsewhere classified: Secondary | ICD-10-CM

## 2017-05-14 DIAGNOSIS — Z6841 Body Mass Index (BMI) 40.0 and over, adult: Secondary | ICD-10-CM | POA: Diagnosis not present

## 2017-05-14 MED ORDER — VITAMIN D (ERGOCALCIFEROL) 1.25 MG (50000 UNIT) PO CAPS: 50000.0000 [IU] | ORAL_CAPSULE | ORAL | 0 refills | Status: DC

## 2017-05-14 MED ORDER — METFORMIN HCL 500 MG PO TABS: 500.0000 mg | ORAL_TABLET | Freq: Two times a day (BID) | ORAL | 0 refills | Status: DC

## 2017-05-15 NOTE — Progress Notes (Signed)
Office: [phone]  /  Fax: [phone]   HPI:   Chief Complaint: OBESITY Louis [name] is here to discuss his progress with his obesity treatment plan. He is on the  keep a food journal with 300 to 400 calories and 30 grams of protein at breakfast daily and follow the Category 4 plan and is following his eating plan approximately 90 % of the time. He states he is walking 1 mile a day. Louis [name] is feeling very frustrated with minimal weight loss. His weight is (!) 405 lb (183.7 kg) today and has had a weight loss of 3 pounds over a period of 2 weeks since his last visit. He has lost 1 lb since starting treatment with [name].  Vitamin D deficiency Louis [name] has a diagnosis of vitamin D deficiency. He is currently taking vit D and denies nausea, vomiting or muscle weakness.  At risk for osteopenia and osteoporosis Louis [name] is at higher risk of osteopenia and osteoporosis due to vitamin D deficiency.   Pre-Diabetes Louis [name] has a diagnosis of prediabetes based on his elevated Hgb A1c and was informed this puts him at greater risk of developing diabetes. He is taking metformin currently and denies any GI side effects. Louis [name] continues to work on diet and exercise to decrease risk of diabetes. He denies any hypoglycemic events.  Hypertension Louis [name] is a 42 y.o. male with hypertension. His blood pressure is elevated today at 166/82 and reports significant anxiety at doctor's office. Louis [name] denies chest pain, chest pressure or headache. He is working weight loss to help control his blood pressure with the goal of decreasing his risk of heart attack and stroke. Louis [name] blood pressure is not currently controlled.  ALLERGIES: Allergies  Allergen Reactions  . No Known Allergies     MEDICATIONS: Current Outpatient Medications on File Prior to Visit  Medication Sig Dispense Refill  . acetaminophen (TYLENOL) 500 MG tablet Take 1,000 mg by mouth every 6 (six) hours as needed (for pain.).    Marland Kitchen  diclofenac (VOLTAREN) 75 MG EC tablet TAKE 1 TABLET (75 MG TOTAL) BY MOUTH 2 (TWO) TIMES DAILY. 60 tablet 2  . losartan (COZAAR) 100 MG tablet TAKE 1 TABLET BY MOUTH EVERY DAY 90 tablet 0   No current facility-administered medications on file prior to visit.     PAST MEDICAL HISTORY: Past Medical History:  Diagnosis Date  . Allergy   . Arthritis    in both knees  . GERD (gastroesophageal reflux disease)   . Headache   . HTN (hypertension)   . Obesity     PAST SURGICAL HISTORY: Past Surgical History:  Procedure Laterality Date  . ACHILLES TENDON REPAIR    . ANTERIOR CRUCIATE LIGAMENT REPAIR    . LUMBAR LAMINECTOMY/DECOMPRESSION MICRODISCECTOMY Right 09/19/2016   Procedure: Right Lumbar Five-Sacral one Laminotomy/Foraminotomy/removal of Synovial cyst;  Surgeon: [name], MD;  Location: [facility] OR;  Service: Neurosurgery;  Laterality: Right;  Right L5-S1 Laminotomy/Foraminotomy/removal of Synovial cyst  . POSTERIOR CERVICAL LAMINECTOMY N/A 06/12/2016   Procedure: Cervical One Posterior cervical laminectomy;  Surgeon: [name], MD;  Location: [facility] OR;  Service: Neurosurgery;  Laterality: N/A;  posterior  . TONSILLECTOMY      SOCIAL HISTORY: Social History   Tobacco Use  . Smoking status: Former Smoker    Packs/day: 0.25    Years: 3.00    Pack years: 0.75    Types: Cigarettes    Last attempt to quit: 06/14/2016    Years since quitting: 0.9  .  Smokeless tobacco: Never Used  Substance Use Topics  . 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  . Drug use: No    FAMILY HISTORY: Family History  Problem Relation Age of Onset  . Diabetes Father   . Obesity Father     ROS: Review of Systems  Constitutional: Positive for weight loss.  Cardiovascular: Negative for chest pain.       Negative for chest pressure  Gastrointestinal: Negative for diarrhea, nausea and vomiting.  Musculoskeletal:        Negative for muscle weakness  Neurological: Negative for headaches.  Endo/Heme/Allergies:       Negative for hypoglycemia    PHYSICAL EXAM: Blood pressure (!) 166/82, pulse 68, temperature 98.3 F (36.8 C), temperature source Oral, height  (1.778 m), weight (!) 405 lb (183.7 kg), SpO2 93 %. Body mass index is 58.11 kg/m. Physical Exam  Constitutional: He is oriented to person, place, and time. He appears well-developed and well-nourished.  Cardiovascular: Normal rate.  Pulmonary/Chest: Effort normal.  Musculoskeletal: Normal range of motion.  Neurological: He is oriented to person, place, and time.  Skin: Skin is warm and dry.  Psychiatric: He has a normal mood and affect. His behavior is normal.  Vitals reviewed.   RECENT LABS AND TESTS: BMET    Component Value Date/Time   NA 144 03/08/2017 0932   K 4.5 03/08/2017 0932   CL 102 03/08/2017 0932   CO2 25 03/08/2017 0932   GLUCOSE 97 03/08/2017 0932   GLUCOSE 102 (H) 09/14/2016 1140   BUN 12 03/08/2017 0932   CREATININE 0.99 03/08/2017 0932   CREATININE 0.94 11/15/2015 0833   CALCIUM 9.0 03/08/2017 0932   GFRNONAA 94 03/08/2017 0932   GFRNONAA >89 11/15/2015 0833   GFRAA 108 03/08/2017 0932   GFRAA >89 11/15/2015 0833   Lab Results  Component Value Date   HGBA1C 5.9 (H) 03/08/2017   HGBA1C 5.8 11/16/2015   Lab Results  Component Value Date   INSULIN 18.0 03/08/2017   CBC    Component Value Date/Time   WBC 8.6 03/08/2017 0932   WBC 8.8 09/14/2016 1140   RBC 4.63 03/08/2017 0932   RBC 4.89 09/14/2016 1140   HGB 13.2 03/08/2017 0932   HCT 41.5 03/08/2017 0932   PLT 285 03/08/2017 0932   MCV 90 03/08/2017 0932   MCH 28.5 03/08/2017 0932   MCH 27.8 09/14/2016 1140   MCHC 31.8 03/08/2017 0932   MCHC 31.8 09/14/2016 1140   RDW 14.7 03/08/2017 0932   LYMPHSABS 2.4 03/08/2017 0932   MONOABS 0.4 11/15/2015 0833   EOSABS 0.1 03/08/2017 0932   BASOSABS 0.0 03/08/2017 0932   Iron/TIBC/Ferritin/  %Sat No results found for: IRON, TIBC, FERRITIN, IRONPCTSAT Lipid Panel     Component Value Date/Time   CHOL 157 03/08/2017 0932   TRIG 68 03/08/2017 0932   HDL 58 03/08/2017 0932   CHOLHDL 2.7 03/08/2017 0932   CHOLHDL 3 11/16/2015 0808   VLDL 12.6 11/16/2015 0808   LDLCALC 85 03/08/2017 0932   Hepatic Function Panel     Component Value Date/Time   PROT 7.7 03/08/2017 0932   ALBUMIN 3.9 03/08/2017 0932   AST 31 03/08/2017 0932   ALT 37 03/08/2017 0932   ALKPHOS 79 03/08/2017 0932   BILITOT 0.3 03/08/2017 0932      Component Value Date/Time  TSH 1.260 03/08/2017 0932   TSH 1.07 11/15/2015 0833   Results for CHAU, SAWIN [facility] (MRN [id number]) as of 05/15/2017 08:31  Ref. Range 03/08/2017 09:32  Vitamin D, 25-Hydroxy Latest Ref Range: 30.0 - 100.0 ng/mL 5.2 (L)   ASSESSMENT AND PLAN: Essential hypertension  Vitamin D deficiency - Plan: Vitamin D, Ergocalciferol, (DRISDOL) 50000 units CAPS capsule  Prediabetes - Plan: metFORMIN (GLUCOPHAGE) 500 MG tablet  At risk for osteoporosis  Class 3 severe obesity with serious comorbidity and body mass index (BMI) of 50.0 to 59.9 in adult, unspecified obesity type (HCC)  PLAN:  Vitamin D Deficiency Louis [name] was informed that low vitamin D levels contributes to fatigue and are associated with obesity, breast, and colon cancer. He agrees to continue to take prescription Vit D ,000 IU every week and will follow up for routine testing of vitamin D, at least 2-3 times per year. He was informed of the risk of over-replacement of vitamin D and agrees to not increase his dose unless he discusses this with [name] first.  At risk for osteopenia and osteoporosis Louis [name] is at risk for osteopenia and osteoporosis due to his vitamin D deficiency. He was encouraged to take his vitamin D and follow his higher calcium diet and increase strengthening exercise to help strengthen his bones and decrease his risk of osteopenia and  osteoporosis.  Pre-Diabetes Louis [name] will continue to work on weight loss, exercise, and decreasing simple carbohydrates in his diet to help decrease the risk of diabetes. We dicussed metformin including benefits and risks. He was informed that eating too many simple carbohydrates or too many calories at one sitting increases the likelihood of GI side effects. Louis [name] agrees to increase metformin to 500 mg bid #60 with no refills and follow up with [name] as directed to monitor his progress.  Hypertension We discussed sodium restriction, working on healthy weight loss, and a regular exercise program as the means to achieve improved blood pressure control. Louis [name] agreed with this plan and agreed to follow up as directed. We will continue to monitor his blood pressure as well as his progress with the above lifestyle modifications. He will continue his medications as prescribed and will watch for signs of hypotension as he continues his lifestyle modifications.  Obesity Louis [name] is currently in the action stage of change. As such, his goal is to continue with weight loss efforts He has agreed to follow the Category 4 plan Louis [name] has been instructed to work up to a goal of 150 minutes of combined cardio and strengthening exercise per week for weight loss and overall health benefits. We discussed the following Behavioral Modification Strategies today: increase H2O intake, planning for success, increasing lean protein intake, increasing vegetables and work on meal planning and easy cooking plans  Louis [name] has agreed to follow up with our clinic in 2 weeks. He was informed of the importance of frequent follow up visits to maximize his success with intensive lifestyle modifications for his multiple health conditions.   OBESITY BEHAVIORAL INTERVENTION VISIT  Today's visit was # 5 out of 22.  Starting weight: 406 lbs Starting date: 03/08/17 Today's weight : 405 lbs  Today's date: 05/14/2017 Total lbs lost to date:  1 (Patients must lose 7 lbs in the first 6 months to continue with counseling)   ASK: We discussed the diagnosis of obesity with Louis [name] today and Louis [name] agreed to give [name] permission to discuss obesity behavioral modification therapy today.  ASSESS: Louis [name] has the diagnosis of obesity  and his BMI today is 58.11 Louis [name] is in the action stage of change   ADVISE: Louis [name] was educated on the multiple health risks of obesity as well as the benefit of weight loss to improve his health. He was advised of the need for long term treatment and the importance of lifestyle modifications.  AGREE: Multiple dietary modification options and treatment options were discussed and  Louis [name] agreed to the above obesity treatment plan.  I, Nevada Crane, am acting as transcriptionist for Filbert Schilder, MD  I have reviewed the above documentation for accuracy and completeness, and I agree with the above. - Debbra Riding, MD

## 2017-06-04 ENCOUNTER — Ambulatory Visit (INDEPENDENT_AMBULATORY_CARE_PROVIDER_SITE_OTHER): Payer: Managed Care, Other (non HMO) | Admitting: Neurology

## 2017-06-04 ENCOUNTER — Ambulatory Visit (INDEPENDENT_AMBULATORY_CARE_PROVIDER_SITE_OTHER): Payer: Managed Care, Other (non HMO) | Admitting: Family Medicine

## 2017-06-04 VITALS — BP 123/69 | HR 71 | Temp 98.3°F | Ht 70.0 in | Wt >= 6400 oz

## 2017-06-04 DIAGNOSIS — R7303 Prediabetes: Secondary | ICD-10-CM | POA: Diagnosis not present

## 2017-06-04 DIAGNOSIS — Z9189 Other specified personal risk factors, not elsewhere classified: Secondary | ICD-10-CM | POA: Diagnosis not present

## 2017-06-04 DIAGNOSIS — E559 Vitamin D deficiency, unspecified: Secondary | ICD-10-CM | POA: Diagnosis not present

## 2017-06-04 DIAGNOSIS — Z6841 Body Mass Index (BMI) 40.0 and over, adult: Secondary | ICD-10-CM

## 2017-06-04 DIAGNOSIS — G4719 Other hypersomnia: Secondary | ICD-10-CM

## 2017-06-04 DIAGNOSIS — G4733 Obstructive sleep apnea (adult) (pediatric): Secondary | ICD-10-CM

## 2017-06-04 DIAGNOSIS — G4734 Idiopathic sleep related nonobstructive alveolar hypoventilation: Secondary | ICD-10-CM

## 2017-06-04 MED ORDER — VITAMIN D (ERGOCALCIFEROL) 1.25 MG (50000 UNIT) PO CAPS
50000.0000 [IU] | ORAL_CAPSULE | ORAL | 0 refills | Status: DC
Start: 2017-06-04 — End: 2017-07-16

## 2017-06-04 NOTE — Progress Notes (Signed)
Office: [phone]  /  Fax: [phone]   HPI:   Chief Complaint: OBESITY Louis [name] is here to discuss his progress with his obesity treatment plan. He is on the Category 4 plan and is following his eating plan approximately 80 % of the time. He states he is walking 1 mile 2 days per week. Louis [name] has been substituting eggs from breakfast with cheese slices. Has been doing sandwich at lunch (using lite mayo) and at dinner doing 8 oz meat and steamed veggies, but missing cheese and condiments at dinner and getting 3 snacks in.  His weight is (!) 412 lb (186.9 kg) today and has gained 7 pounds since his last visit. He has lost 0 lbs since starting treatment with Louis [name].  Obstructive Sleep Apnea Louis [name] is going today to do sleep study at home today.  Pre-Diabetes Louis [name] has a diagnosis of pre-diabetes based on his elevated Hgb A1c and was informed this puts him at greater risk of developing diabetes. He notes no GI side effects on metformin and continues to work on diet and exercise to decrease risk of diabetes. He denies nausea or hypoglycemia.  Vitamin D Deficiency Louis [name] has a diagnosis of vitamin D deficiency. He is currently taking prescription Vit D. He notes fatigue and denies nausea, vomiting or muscle weakness.  At risk for osteopenia and osteoporosis Louis [name] is at higher risk of osteopenia and osteoporosis due to vitamin D deficiency.   ALLERGIES: Allergies  Allergen Reactions  . No Known Allergies     MEDICATIONS: Current Outpatient Medications on File Prior to Visit  Medication Sig Dispense Refill  . acetaminophen (TYLENOL) 500 MG tablet Take 1,000 mg by mouth every 6 (six) hours as needed (for pain.).    Marland Kitchen. diclofenac (VOLTAREN) 75 MG EC tablet TAKE 1 TABLET (75 MG TOTAL) BY MOUTH 2 (TWO) TIMES DAILY. 60 tablet 2  . losartan (COZAAR) 100 MG tablet TAKE 1 TABLET BY MOUTH EVERY DAY 90 tablet 0  . metFORMIN (GLUCOPHAGE) 500 MG tablet Take 1 tablet (500 mg total) by mouth 2 (two) times  daily with a meal. 60 tablet 0   No current facility-administered medications on file prior to visit.     PAST MEDICAL HISTORY: Past Medical History:  Diagnosis Date  . Allergy   . Arthritis    in both knees  . GERD (gastroesophageal reflux disease)   . Headache   . HTN (hypertension)   . Obesity     PAST SURGICAL HISTORY: Past Surgical History:  Procedure Laterality Date  . ACHILLES TENDON REPAIR    . ANTERIOR CRUCIATE LIGAMENT REPAIR    . LUMBAR LAMINECTOMY/DECOMPRESSION MICRODISCECTOMY Right 09/19/2016   Procedure: Right Lumbar Five-Sacral one Laminotomy/Foraminotomy/removal of Synovial cyst;  Surgeon: Barnett AbuElsner, Henry, MD;  [facility];  Service: Neurosurgery;  Laterality: Right;  Right L5-S1 Laminotomy/Foraminotomy/removal of Synovial cyst  . POSTERIOR CERVICAL LAMINECTOMY N/A 06/12/2016   Procedure: Cervical One Posterior cervical laminectomy;  Surgeon: Barnett AbuElsner, Henry, MD;  [facility];  Service: Neurosurgery;  Laterality: N/A;  posterior  . TONSILLECTOMY      SOCIAL HISTORY: Social History   Tobacco Use  . Smoking status: Former Smoker    Packs/day: 0.25    Years: 3.00    Pack years: 0.75    Types: Cigarettes    Last attempt to quit: 06/14/2016    Years since quitting: 0.9  . Smokeless tobacco: Never Used  Substance Use Topics  . 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  . Drug use: No    FAMILY HISTORY: Family History  Problem Relation Age of Onset  . Diabetes Father   . Obesity Father     ROS: Review of Systems  Constitutional: Positive for malaise/fatigue. Negative for weight loss.  Gastrointestinal: Negative for nausea and vomiting.  Musculoskeletal:       Negative muscle weakness  Endo/Heme/Allergies:       Negative hypoglycemia    PHYSICAL EXAM: Blood pressure 123/69, pulse 71, temperature 98.3 F (36.8 C), temperature source Oral, height 5\' 10"  (1.778  m), weight (!) 412 lb (186.9 kg), SpO2 97 %. Body mass index is 59.12 kg/m. Physical Exam  Constitutional: He is oriented to person, place, and time. He appears well-developed and well-nourished.  Cardiovascular: Normal rate.  Pulmonary/Chest: Effort normal.  Musculoskeletal: Normal range of motion.  Neurological: He is oriented to person, place, and time.  Skin: Skin is warm and dry.  Psychiatric: He has a normal mood and affect. His behavior is normal.  Vitals reviewed.   RECENT LABS AND TESTS: BMET    Component Value Date/Time   NA 144 03/08/2017 0932   K 4.5 03/08/2017 0932   CL 102 03/08/2017 0932   CO2 25 03/08/2017 0932   GLUCOSE 97 03/08/2017 0932   GLUCOSE 102 (H) 09/14/2016 1140   BUN 12 03/08/2017 0932   CREATININE 0.99 03/08/2017 0932   CREATININE 0.94 11/15/2015 0833   CALCIUM 9.0 03/08/2017 0932   GFRNONAA 94 03/08/2017 0932   GFRNONAA >89 11/15/2015 0833   GFRAA 108 03/08/2017 0932   GFRAA >89 11/15/2015 0833   Lab Results  Component Value Date   HGBA1C 5.9 (H) 03/08/2017   HGBA1C 5.8 11/16/2015   Lab Results  Component Value Date   INSULIN 18.0 03/08/2017   CBC    Component Value Date/Time   WBC 8.6 03/08/2017 0932   WBC 8.8 09/14/2016 1140   RBC 4.63 03/08/2017 0932   RBC 4.89 09/14/2016 1140   HGB 13.2 03/08/2017 0932   HCT 41.5 03/08/2017 0932   PLT 285 03/08/2017 0932   MCV 90 03/08/2017 0932   MCH 28.5 03/08/2017 0932   MCH 27.8 09/14/2016 1140   MCHC 31.8 03/08/2017 0932   MCHC 31.8 09/14/2016 1140   RDW 14.7 03/08/2017 0932   LYMPHSABS 2.4 03/08/2017 0932   MONOABS 0.4 11/15/2015 0833   EOSABS 0.1 03/08/2017 0932   BASOSABS 0.0 03/08/2017 0932   Iron/TIBC/Ferritin/ %Sat No results found for: IRON, TIBC, FERRITIN, IRONPCTSAT Lipid Panel     Component Value Date/Time   CHOL 157 03/08/2017 0932   TRIG 68 03/08/2017 0932   HDL 58 03/08/2017 0932   CHOLHDL 2.7 03/08/2017 0932   CHOLHDL 3 11/16/2015 0808   VLDL 12.6 11/16/2015  0808   LDLCALC 85 03/08/2017 0932   Hepatic Function Panel     Component Value Date/Time   PROT 7.7 03/08/2017 0932   ALBUMIN 3.9 03/08/2017 0932   AST 31 03/08/2017 0932   ALT 37 03/08/2017 0932   ALKPHOS 79 03/08/2017 0932   BILITOT 0.3 03/08/2017 0932      Component Value Date/Time   TSH 1.260 03/08/2017 0932   TSH 1.07 11/15/2015 0833  Results for CORDNEY, BARSTOW DAMON (MRN [id number]) as of 06/04/2017 10:54  Ref. Range 03/08/2017 09:32  Vitamin D, 25-Hydroxy Latest Ref Range: 30.0 - 100.0 ng/mL 5.2 (L)  ASSESSMENT AND PLAN: OSA (obstructive sleep apnea)  Prediabetes  Vitamin D deficiency - Plan: Vitamin D, Ergocalciferol, (DRISDOL) 50000 units CAPS capsule  At risk for osteoporosis  Class 3 severe obesity with serious comorbidity and body mass index (BMI) of 50.0 to 59.9 in adult, unspecified obesity type (HCC)  PLAN:  Obstructive Sleep Apnea We will follow up on sleep study results at next visit. Louis [name] agrees to follow up with our clinic in 2 to 3 weeks.  Pre-Diabetes Louis [name] will continue to work on weight loss, exercise, and decreasing simple carbohydrates in his diet to help decrease the risk of diabetes. We dicussed metformin including benefits and risks. He was informed that eating too many simple carbohydrates or too many calories at one sitting increases the likelihood of GI side effects. Louis [name] agrees to continue taking metformin 500 mg PO BID. Louis [name] agreed to follow up with our clinic in 2 to 3 weeks as directed to monitor his progress.  Vitamin D Deficiency Louis [name] was informed that low vitamin D levels contributes to fatigue and are associated with obesity, breast, and colon cancer. Louis [name] agrees to continue taking prescription Vit D @50 ,000 IU every week #4 and we will refill for 1 month. He will follow up for routine testing of vitamin D, at least 2-3 times per year. He was informed of the risk of over-replacement of vitamin D and agrees to not increase his dose  unless he discusses this with [name] first. Louis [name] agrees to follow up with our clinic in 2 to 3 weeks.  At risk for osteopenia and osteoporosis Louis [name] is at risk for osteopenia and osteoporsis due to his vitamin D deficiency. He was encouraged to take his vitamin D and follow his higher calcium diet and increase strengthening exercise to help strengthen his bones and decrease his risk of osteopenia and osteoporosis.  Obesity Louis [name] is currently in the action stage of change. As such, his goal is to continue with weight loss efforts He has agreed to keep a food journal with 1700-1900 calories and 100 grams of protein or follow the Category 4 plan Louis [name] has been instructed to work up to a goal of 150 minutes of combined cardio and strengthening exercise per week for weight loss and overall health benefits. We discussed the following Behavioral Modification Strategies today: increasing lean protein intake, work on meal planning and easy cooking plans, better snacking choices, and planning for success   Louis [name] has agreed to follow up with our clinic in 2 to 3 weeks. He was informed of the importance of frequent follow up visits to maximize his success with intensive lifestyle modifications for his multiple health conditions.   OBESITY BEHAVIORAL INTERVENTION VISIT  Today's visit was # 6 out of 22.  Starting weight: 406 Starting date: 03/08/17 Today's weight : Weight: (!) 412 lb (186.9 kg)  Today's date: 06/04/2017 Total lbs lost to date: 0 (Patients must lose 7 lbs in the first 6 months to continue with counseling)   ASK: We discussed the diagnosis of obesity with Louis [name] today and Louis Peon agreed to give [name] permission to discuss obesity behavioral modification therapy today.  ASSESS: Louis [name] has the diagnosis of obesity and his BMI today is @TBMI @ Louis [name] is in the action stage of change   ADVISE: Louis [name] was educated on the multiple health risks of obesity as well as the benefit of weight  loss to improve his health. He was advised of the need for long term treatment and the importance of  lifestyle modifications.  AGREE: Multiple dietary modification options and treatment options were discussed and  Louis [name] agreed to the above obesity treatment plan.   I have reviewed the above documentation for accuracy and completeness, and I agree with the above. - Debbra Riding, MD

## 2017-06-07 ENCOUNTER — Other Ambulatory Visit: Payer: Self-pay | Admitting: Medical

## 2017-06-08 NOTE — Telephone Encounter (Signed)
rx diclofenac sent to pt pharmacy.

## 2017-06-08 NOTE — Progress Notes (Signed)
Patient referred by Dr. Rinaldo RatelKadolph, seen by me on 04/30/17, HST on 06/04/17.    Please call and notify the patient that the recent home sleep test showed obstructive sleep apnea. OSA is overall mild, but worth treating to see if he feels better after treatment, especially, as his O2 sats dropped repeatedly into 80 and 70s, as low as 72%. To that end I recommend treatment for this in the form of autoPAP, which means, that we don't have to bring him in for a sleep study with CPAP, but will let him try an autoPAP machine at home, through a DME company (of his choice, or as per insurance requirement). The DME representative will educate him on how to use the machine, how to put the mask on, etc. I have placed an order in the chart. Please send referral, talk to patient, send report to referring MD. We will need a FU in sleep clinic for 10 weeks post-PAP set up, please arrange that with me or one of our NPs. Thanks,   Huston FoleySaima Zilda No, MD, PhD [facility]

## 2017-06-08 NOTE — Telephone Encounter (Signed)
Pt requesting refill on Diclofenac last appointment 10/30/2016. Please advise.

## 2017-06-08 NOTE — Procedures (Signed)
[facility] [address] [place], [place] [address] NAME:   Louis Grandchildvery Damon Haris                                                               DOB: 1975/03/26 MEDICAL RECORD NUMBER [id number]                                                          DOS:  06/04/17 REFERRING PHYSICIAN: Debbra RidingAlexandria Kadolph, MD STUDY PERFORMED: Home Sleep Test HISTORY: 42 year old man with a history of hypertension, reflux disease, arthritis, allergies, recurrent headaches, status post neck and lower back surgery, and morbid obesity, who reports snoring and excessive daytime somnolence as well as shortness of breath. His Epworth sleepiness score is 12 out of 24 today, BMI: 59.5.   STUDY RESULTS:  Total Recording Time: 6 hours, 57 minutes Total Apnea/Hypopnea Index (AHI): 10.6/h, RDI: 13.8/h Average Oxygen Saturation:  94%, Lowest Oxygen Desaturation: 72%  Total Time Oxygen Saturation Below or at 88%: 36 minutes (9%) Average Heart Rate: 69 bpm IMPRESSION: OSA, nocturnal hypoxemia RECOMMENDATION: This home sleep test demonstrates overall mild obstructive sleep apnea with a total AHI of 10.6/hour and O2 nadir of 72%. There was significant time below 89% saturation of over 30 minutes, indicating nocturnal hypoxemia. Given the patient's medical history and sleep related complaints, treatment with positive airway pressure is recommended. This can be achieved in the form of autoPAP trial/titration at home. A full night CPAP titration study will help with proper treatment settings and mask fitting, if needed. Alternative treatments include weight loss along with avoidance of the supine sleep position, or an oral appliance in appropriate candidates. Please note that untreated obstructive sleep apnea may carry additional perioperative morbidity. Patients with significant obstructive sleep apnea should receive perioperative PAP therapy and the surgeons and particularly the anesthesiologist should be  informed of the diagnosis and the severity of the sleep disordered breathing. The patient should be cautioned not to drive, work at heights, or operate dangerous or heavy equipment when tired or sleepy. Review and reiteration of good sleep hygiene measures should be pursued with any patient. Other causes of the patient's symptoms, including circadian rhythm disturbances, an underlying mood disorder, medication effect and/or an underlying medical problem cannot be ruled out based on this test. Clinical correlation is recommended. The patient and his referring provider will be notified of the test results. The patient [facility].  I certify that I have reviewed the raw data recording prior to the issuance of this report in accordance with the standards of the American Academy of Sleep Medicine (AASM).  Huston FoleySaima Casha Estupinan, MD, PhD [facility] (Diplomat, ABPN (Neurology and Sleep)

## 2017-06-08 NOTE — Progress Notes (Signed)
cp

## 2017-06-08 NOTE — Addendum Note (Signed)
Addended by: Huston FoleyATHAR, Aizlynn Digilio on: 06/08/2017 02:14 PM   Modules accepted: Orders

## 2017-06-11 ENCOUNTER — Telehealth: Payer: Self-pay

## 2017-06-11 NOTE — Telephone Encounter (Signed)
-----   Message from Huston FoleySaima Athar, MD sent at 06/08/2017  2:14 PM EDT ----- Patient referred by Dr. Rinaldo RatelKadolph, seen by me on 04/30/17, HST on 06/04/17.    Please call and notify the patient that the recent home sleep test showed obstructive sleep apnea. OSA is overall mild, but worth treating to see if he feels better after treatment, especially, as his O2 sats dropped repeatedly into 80 and 70s, as low as 72%. To that end I recommend treatment for this in the form of autoPAP, which means, that we don't have to bring him in for a sleep study with CPAP, but will let him try an autoPAP machine at home, through a DME company (of his choice, or as per insurance requirement). The DME representative will educate him on how to use the machine, how to put the mask on, etc. I have placed an order in the chart. Please send referral, talk to patient, send report to referring MD. We will need a FU in sleep clinic for 10 weeks post-PAP set up, please arrange that with me or one of our NPs. Thanks,   Huston FoleySaima Athar, MD, PhD [facility])

## 2017-06-11 NOTE — Telephone Encounter (Signed)
I called pt to discuss. No answer, left a message asking him to call me back. 

## 2017-06-11 NOTE — Telephone Encounter (Signed)
Pt returned my call. I advised pt that Dr. Frances FurbishAthar reviewed their sleep study results and found that pt has mild osa with oxygen desaturations. Dr. Frances FurbishAthar recommends that pt start an auto-pap at home. I reviewed PAP compliance expectations with the pt. Pt is agreeable to starting an auto-PAP. I advised pt that an order will be sent to a DME, [facility], and [facility] will call the pt within about one week after they file with the pt's insurance. [facility] will show the pt how to use the machine, fit for masks, and troubleshoot the auto-PAP if needed. A follow up appt was made for insurance purposes with Dr. Frances FurbishAthar on 09/17/17 at 9:30am. Pt verbalized understanding to arrive 15 minutes early and bring their auto-PAP. A letter with all of this information in it will be mailed to the pt as a reminder. I verified with the pt that the address we have on file is correct. Pt verbalized understanding of results. Pt had no questions at this time but was encouraged to call back if questions arise.

## 2017-06-21 ENCOUNTER — Ambulatory Visit (INDEPENDENT_AMBULATORY_CARE_PROVIDER_SITE_OTHER): Payer: Managed Care, Other (non HMO) | Admitting: Family Medicine

## 2017-06-21 VITALS — BP 135/70 | HR 76 | Temp 98.3°F | Ht 70.0 in | Wt >= 6400 oz

## 2017-06-21 DIAGNOSIS — F3289 Other specified depressive episodes: Secondary | ICD-10-CM

## 2017-06-21 DIAGNOSIS — Z6841 Body Mass Index (BMI) 40.0 and over, adult: Secondary | ICD-10-CM

## 2017-06-21 DIAGNOSIS — R7303 Prediabetes: Secondary | ICD-10-CM

## 2017-06-21 DIAGNOSIS — Z9189 Other specified personal risk factors, not elsewhere classified: Secondary | ICD-10-CM | POA: Diagnosis not present

## 2017-06-21 MED ORDER — BUPROPION HCL ER (SR) 150 MG PO TB12: 150.0000 mg | ORAL_TABLET | Freq: Every day | ORAL | 0 refills | Status: DC

## 2017-06-21 MED ORDER — METFORMIN HCL 500 MG PO TABS: 500.0000 mg | ORAL_TABLET | Freq: Two times a day (BID) | ORAL | 0 refills | Status: DC

## 2017-06-21 NOTE — Progress Notes (Signed)
Office: [phone]  /  Fax: [phone]   HPI:   Chief Complaint: OBESITY Louis [name] is here to discuss his progress with his obesity treatment plan. He is on the keep a food journal with 1700-1900 calories and 100 grams of protein daily and is following his eating plan approximately 90 % of the time. He states he is walking 1/2 mile on Saturdays. Louis [name] has been less physically active at work and continues to gain weight. He states he is journaling and calories are around 1,900 but he states he doesn't have to journal cause he is good keeping the numbers in his head.  His weight is (!) 414 lb (187.8 kg) today and has gained 2 pounds since his last visit. He has lost 0 lbs since starting treatment with us.  Pre-Diabetes Louis [name] has a diagnosis of pre-diabetes based on his elevated Hgb A1c and was informed this puts him at greater risk of developing diabetes. He is having GI upset on metformin especially in the evening. He is likely eating too many simple carbohydrates and was advised to decrease these. He continues to work on diet and exercise to decrease risk of diabetes. He denies hypoglycemia.  At risk for diabetes Louis [name] is at higher than average risk for developing diabetes due to his obesity and pre-diabetes. He currently denies polyuria or polydipsia.  Depression with emotional eating behaviors Louis [name] is frustrated he is not losing weight even though he is not following our advice. He feels he is eating healthy and that should be enough. Louis [name] struggles with emotional eating and using food for comfort to the extent that it is negatively impacting his health. He often snacks when he is not hungry. Louis [name] sometimes feels he is out of control and then feels guilty that he made poor food choices. He has been working on behavior modification techniques to help reduce his emotional eating and has been somewhat successful. He shows no sign of suicidal or homicidal ideations.  [facility]  03/08/2017 04/17/2016  Decreased Interest 1 0  Down, Depressed, Hopeless 0 0  PHQ - 2 Score 1 0  Altered sleeping 2 0  Tired, decreased energy 3 0  Change in appetite 0 0  Feeling bad or failure about yourself  0 0  Trouble concentrating 0 0  Moving slowly or fidgety/restless 0 0  Suicidal thoughts 0 0  PHQ-9 Score 6 0  Difficult doing work/chores Somewhat difficult -    ALLERGIES: Allergies  Allergen Reactions  . No Known Allergies     MEDICATIONS: Current Outpatient Medications on File Prior to Visit  Medication Sig Dispense Refill  . acetaminophen (TYLENOL) 500 MG tablet Take 1,000 mg by mouth every 6 (six) hours as needed (for pain.).    Marland Kitchen. diclofenac (VOLTAREN) 75 MG EC tablet TAKE 1 TABLET (75 MG TOTAL) BY MOUTH 2 (TWO) TIMES DAILY. 60 tablet 0  . losartan (COZAAR) 100 MG tablet TAKE 1 TABLET BY MOUTH EVERY DAY 90 tablet 0  . Vitamin D, Ergocalciferol, (DRISDOL) 50000 units CAPS capsule Take 1 capsule (50,000 Units total) by mouth every 7 (seven) days. 4 capsule 0   No current facility-administered medications on file prior to visit.     PAST MEDICAL HISTORY: Past Medical History:  Diagnosis Date  . Allergy   . Arthritis    in both knees  . GERD (gastroesophageal reflux disease)   . Headache   . HTN (hypertension)   . Obesity     PAST SURGICAL HISTORY:  Past Surgical History:  Procedure Laterality Date  . ACHILLES TENDON REPAIR    . ANTERIOR CRUCIATE LIGAMENT REPAIR    . LUMBAR LAMINECTOMY/DECOMPRESSION MICRODISCECTOMY Right 09/19/2016   Procedure: Right Lumbar Five-Sacral one Laminotomy/Foraminotomy/removal of Synovial cyst;  Surgeon: [name], MD;  Location: [facility] OR;  Service: Neurosurgery;  Laterality: Right;  Right L5-S1 Laminotomy/Foraminotomy/removal of Synovial cyst  . POSTERIOR CERVICAL LAMINECTOMY N/A 06/12/2016   Procedure: Cervical One Posterior cervical laminectomy;  Surgeon: [name], MD;  Location: [facility] OR;  Service: Neurosurgery;  Laterality:  N/A;  posterior  . TONSILLECTOMY      SOCIAL HISTORY: Social History   Tobacco Use  . Smoking status: Former Smoker    Packs/day: 0.25    Years: 3.00    Pack years: 0.75    Types: Cigarettes    Last attempt to quit: 06/14/2016    Years since quitting: 1.0  . Smokeless tobacco: Never Used  Substance Use Topics  . 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  . Drug use: No    FAMILY HISTORY: Family History  Problem Relation Age of Onset  . Diabetes Father   . Obesity Father     ROS: Review of Systems  Constitutional: Positive for weight loss.  Genitourinary: Negative for frequency.  Endo/Heme/Allergies: Negative for polydipsia.       Negative hypoglycemia  Psychiatric/Behavioral: Positive for depression. Negative for suicidal ideas.    PHYSICAL EXAM: Blood pressure 135/70, pulse 76, temperature 98.3 F (36.8 C), temperature source Oral, height 5\' 10"  (1.778 m), weight (!) 414 lb (187.8 kg), SpO2 96 %. Body mass index is 59.4 kg/m. Physical Exam  Constitutional: He is oriented to person, place, and time. He appears well-developed and well-nourished.  Cardiovascular: Normal rate.  Pulmonary/Chest: Effort normal.  Musculoskeletal: Normal range of motion.  Neurological: He is oriented to person, place, and time.  Skin: Skin is warm and dry.  Psychiatric: He has a normal mood and affect. His behavior is normal.  Vitals reviewed.   RECENT LABS AND TESTS: BMET    Component Value Date/Time   NA 144 03/08/2017 0932   K 4.5 03/08/2017 0932   CL 102 03/08/2017 0932   CO2 25 03/08/2017 0932   GLUCOSE 97 03/08/2017 0932   GLUCOSE 102 (H) 09/14/2016 1140   BUN 12 03/08/2017 0932   CREATININE 0.99 03/08/2017 0932   CREATININE 0.94 11/15/2015 0833   CALCIUM 9.0 03/08/2017 0932   GFRNONAA 94 03/08/2017 0932   GFRNONAA >89 11/15/2015 0833   GFRAA 108 03/08/2017 0932   GFRAA >89  11/15/2015 0833   Lab Results  Component Value Date   HGBA1C 5.9 (H) 03/08/2017   HGBA1C 5.8 11/16/2015   Lab Results  Component Value Date   INSULIN 18.0 03/08/2017   CBC    Component Value Date/Time   WBC 8.6 03/08/2017 0932   WBC 8.8 09/14/2016 1140   RBC 4.63 03/08/2017 0932   RBC 4.89 09/14/2016 1140   HGB 13.2 03/08/2017 0932   HCT 41.5 03/08/2017 0932   PLT 285 03/08/2017 0932   MCV 90 03/08/2017 0932   MCH 28.5 03/08/2017 0932   MCH 27.8 09/14/2016 1140   MCHC 31.8 03/08/2017 0932   MCHC 31.8 09/14/2016 1140   RDW 14.7 03/08/2017 0932   LYMPHSABS 2.4 03/08/2017 0932   MONOABS 0.4 11/15/2015 0833   EOSABS 0.1  03/08/2017 0932   BASOSABS 0.0 03/08/2017 0932   Iron/TIBC/Ferritin/ %Sat No results found for: IRON, TIBC, FERRITIN, IRONPCTSAT Lipid Panel     Component Value Date/Time   CHOL 157 03/08/2017 0932   TRIG 68 03/08/2017 0932   HDL 58 03/08/2017 0932   CHOLHDL 2.7 03/08/2017 0932   CHOLHDL 3 11/16/2015 0808   VLDL 12.6 11/16/2015 0808   LDLCALC 85 03/08/2017 0932   Hepatic Function Panel     Component Value Date/Time   PROT 7.7 03/08/2017 0932   ALBUMIN 3.9 03/08/2017 0932   AST 31 03/08/2017 0932   ALT 37 03/08/2017 0932   ALKPHOS 79 03/08/2017 0932   BILITOT 0.3 03/08/2017 0932      Component Value Date/Time   TSH 1.260 03/08/2017 0932   TSH 1.07 11/15/2015 0833    ASSESSMENT AND PLAN: Prediabetes - Plan: metFORMIN (GLUCOPHAGE) 500 MG tablet  Other depression - with emotional eating - Plan: buPROPion (WELLBUTRIN SR) 150 MG 12 hr tablet  At risk for diabetes mellitus  Class 3 severe obesity with serious comorbidity and body mass index (BMI) of 50.0 to 59.9 in adult, unspecified obesity type (HCC)  PLAN:  Pre-Diabetes Bradey will continue to work on weight loss, diet, exercise, and decreasing simple carbohydrates in his diet to help decrease the risk of diabetes. We dicussed metformin including benefits and risks. He was informed that  eating too many simple carbohydrates or too many calories at one sitting increases the likelihood of GI side effects. Cable agrees to continue taking metformin 500 mg BID #60 and we will refill for 1 month. Savvas agrees to follow up with our clinic in 3 weeks as directed to monitor his progress.  Diabetes risk counselling Anthonee was given extended (15 minutes) diabetes prevention counseling today. He is 42 y.o. male and has risk factors for diabetes including obesity and pre-diabetes. We discussed intensive lifestyle modifications today with an emphasis on weight loss as well as increasing exercise and decreasing simple carbohydrates in his diet.  Depression with Emotional Eating Behaviors We discussed behavior modification techniques today to help Joedy deal with his emotional eating and depression. Kanyon agrees to start Wellbutrin SR 150 mg q AM #30 with no refills, as I suspected he is using food for comfort. Rishan agrees to follow up with our clinic in 3 weeks.  Obesity Monti is currently in the action stage of change. As such, his goal is to continue with weight loss efforts He has agreed to keep a food journal with 1900 calories and 100 grams of protein daily Zeph has been instructed to work up to a goal of 150 minutes of combined cardio and strengthening exercise per week for weight loss and overall health benefits. We discussed the following Behavioral Modification Strategies today: increasing lean protein intake, decreasing simple carbohydrates  and work on meal planning and easy cooking plans Deyon was advised again to journal strictly and keep the numbers and food in the app, MyFitness pal.  Roderick has agreed to follow up with our clinic in 3 weeks. He was informed of the importance of frequent follow up visits to maximize his success with intensive lifestyle modifications for his multiple health conditions.   OBESITY BEHAVIORAL INTERVENTION VISIT  Today's visit was # 7 out of  22.  Starting weight: 406 lbs Starting date: 03/08/17 Today's weight : 414 lbs  Today's date: 06/21/2017 Total lbs lost to date: 0 (Patients must lose 7 lbs in the first 6 months to continue with  counseling)   ASK: We discussed the diagnosis of obesity with [name] today and Louis Peon agreed to give [name] permission to discuss obesity behavioral modification therapy today.  ASSESS: Kyjuan has the diagnosis of obesity and his BMI today is 59.4 Reznor is in the action stage of change   ADVISE: Elgin was educated on the multiple health risks of obesity as well as the benefit of weight loss to improve his health. He was advised of the need for long term treatment and the importance of lifestyle modifications.  AGREE: Multiple dietary modification options and treatment options were discussed and  Collyn agreed to the above obesity treatment plan.  I, Burt Knack, am acting as transcriptionist for Quillian Quince, MD  I have reviewed the above documentation for accuracy and completeness, and I agree with the above. -Quillian Quince, MD

## 2017-07-08 ENCOUNTER — Other Ambulatory Visit: Payer: Self-pay | Admitting: Medical

## 2017-07-09 NOTE — Telephone Encounter (Signed)
LVM for pt to call office and set up a FU appt with his provider.

## 2017-07-09 NOTE — Telephone Encounter (Signed)
Pt is due for follow up please call and schedule.  

## 2017-07-13 ENCOUNTER — Other Ambulatory Visit: Payer: Self-pay | Admitting: Medical

## 2017-07-16 ENCOUNTER — Ambulatory Visit (INDEPENDENT_AMBULATORY_CARE_PROVIDER_SITE_OTHER): Payer: Managed Care, Other (non HMO) | Admitting: Family Medicine

## 2017-07-16 ENCOUNTER — Ambulatory Visit: Payer: Managed Care, Other (non HMO) | Admitting: Medical

## 2017-07-16 ENCOUNTER — Encounter (INDEPENDENT_AMBULATORY_CARE_PROVIDER_SITE_OTHER): Payer: Self-pay

## 2017-07-16 ENCOUNTER — Other Ambulatory Visit (INDEPENDENT_AMBULATORY_CARE_PROVIDER_SITE_OTHER): Payer: Self-pay

## 2017-07-16 ENCOUNTER — Encounter: Payer: Self-pay | Admitting: Medical

## 2017-07-16 VITALS — BP 140/88 | HR 99 | Temp 98.3°F | Resp 16 | Ht 70.0 in | Wt >= 6400 oz

## 2017-07-16 DIAGNOSIS — R202 Paresthesia of skin: Secondary | ICD-10-CM | POA: Diagnosis not present

## 2017-07-16 DIAGNOSIS — I10 Essential (primary) hypertension: Secondary | ICD-10-CM | POA: Diagnosis not present

## 2017-07-16 DIAGNOSIS — E785 Hyperlipidemia, unspecified: Secondary | ICD-10-CM | POA: Diagnosis not present

## 2017-07-16 DIAGNOSIS — E559 Vitamin D deficiency, unspecified: Secondary | ICD-10-CM

## 2017-07-16 DIAGNOSIS — R739 Hyperglycemia, unspecified: Secondary | ICD-10-CM

## 2017-07-16 DIAGNOSIS — G4730 Sleep apnea, unspecified: Secondary | ICD-10-CM

## 2017-07-16 DIAGNOSIS — Z6841 Body Mass Index (BMI) 40.0 and over, adult: Secondary | ICD-10-CM | POA: Diagnosis not present

## 2017-07-16 DIAGNOSIS — R351 Nocturia: Secondary | ICD-10-CM

## 2017-07-16 DIAGNOSIS — M79609 Pain in unspecified limb: Secondary | ICD-10-CM | POA: Diagnosis not present

## 2017-07-16 LAB — PSA: PSA: 1.1 ng/mL (ref 0.10–4.00)

## 2017-07-16 LAB — COMPREHENSIVE METABOLIC PANEL
ALK PHOS: 84 U/L (ref 39–117)
ALT: 24 U/L (ref 0–53)
AST: 18 U/L (ref 0–37)
Albumin: 4 g/dL (ref 3.5–5.2)
BILIRUBIN TOTAL: 0.3 mg/dL (ref 0.2–1.2)
BUN: 10 mg/dL (ref 6–23)
CALCIUM: 9 mg/dL (ref 8.4–10.5)
CO2: 30 meq/L (ref 19–32)
CREATININE: 1.13 mg/dL (ref 0.40–1.50)
Chloride: 101 mEq/L (ref 96–112)
GFR: 91.28 mL/min (ref >60.00)
GLUCOSE: 98 mg/dL (ref 70–99)
Potassium: 4.3 mEq/L (ref 3.5–5.1)
Sodium: 139 mEq/L (ref 135–145)
TOTAL PROTEIN: 7.8 g/dL (ref 6.0–8.3)

## 2017-07-16 LAB — POC URINALSYSI DIPSTICK (AUTOMATED)
BILIRUBIN UA: NEGATIVE
GLUCOSE UA: NEGATIVE
KETONES UA: NEGATIVE
Leukocytes, UA: NEGATIVE
Nitrite, UA: NEGATIVE
Protein, UA: NEGATIVE
RBC UA: NEGATIVE
SPEC GRAV UA: 1.025 (ref 1.010–1.025)
Urobilinogen, UA: NEGATIVE E.U./dL — AB
pH, UA: 6 (ref 5.0–8.0)

## 2017-07-16 LAB — LIPID PANEL
CHOL/HDL RATIO: 3
Cholesterol: 168 mg/dL (ref 0–200)
HDL: 48.6 mg/dL (ref >39.00)
LDL Cholesterol: 106 mg/dL — ABNORMAL HIGH (ref 0–99)
NONHDL: 119.18
Triglycerides: 65 mg/dL (ref 0.0–149.0)
VLDL: 13 mg/dL (ref 0.0–40.0)

## 2017-07-16 LAB — HEMOGLOBIN A1C: HEMOGLOBIN A1C: 5.9 % (ref 4.6–6.5)

## 2017-07-16 MED ORDER — VITAMIN D (ERGOCALCIFEROL) 1.25 MG (50000 UNIT) PO CAPS
50000.0000 [IU] | ORAL_CAPSULE | ORAL | 0 refills | Status: DC
Start: 2017-07-16 — End: 2017-08-08

## 2017-07-16 NOTE — Progress Notes (Signed)
Subjective:    Patient ID: Louis [name], male    DOB: August 16, 1975, 42 y.o.   MRN: [id number]  HPI   Pt in for follow up.  He update me that he has seen weight loss clinic. On metformin for possible prediabetes and weight loss.  1st month he did loose weight but when his job was sedentary he added wellbutrin. He thinks he gained weight. He has appointment with specialist coming up. Outside of work he has been walking.   Pt bp little high today. No cardiac or neurologic signs or symptoms.  Pt was found out to have sleep apnea. CPAP has been helping him.   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 Had surgery for this and now some left thigh tingling and pain at times past 2 month. But not recurrent back pain.    Review of Systems  Constitutional: Negative for chills, fatigue and fever.  Respiratory: Negative for cough, chest tightness, shortness of breath and wheezing.   Cardiovascular: Negative for chest pain and palpitations.  Genitourinary: Negative for difficulty urinating, dysuria, flank pain, genital sores, scrotal swelling and urgency.       Some urination at night. He gets up 2-3 times a night on average.  Musculoskeletal:       Former neck and back pain is now much better.  However, he does report occasional left thigh tingling, burning sensation. No rash reported. Present for past 2 months.  Neurological: Negative for dizziness and light-headedness.  Hematological: Negative for adenopathy. Does not bruise/bleed easily.  Psychiatric/Behavioral: Negative for behavioral problems and confusion.   Past Medical History:  Diagnosis Date  . Allergy   . Arthritis    in both knees  . GERD (gastroesophageal reflux disease)   . Headache   . HTN (hypertension)   . Obesity      Social History   Socioeconomic History  . Marital status: Married    Spouse name: Bunnie PionCarol Casas  . Number of children: 7  . Years of  education: Not on file  . Highest education level: Not on file  Occupational History  . Occupation: Corporate investment bankerConstruction worker  Social Needs  . Financial resource strain: Not on file  . Food insecurity:    Worry: Not on file    Inability: Not on file  . Transportation needs:    Medical: Not on file    Non-medical: Not on file  Tobacco Use  . Smoking status: Former Smoker    Packs/day: 0.25    Years: 3.00    Pack years: 0.75    Types: Cigarettes    Last attempt to quit: 06/14/2016    Years since quitting: 1.0  . Smokeless tobacco: Never Used  Substance and Sexual Activity  . 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  . Drug use: No  . Sexual activity: Yes  Lifestyle  . Physical activity:    Days per week: Not on file    Minutes per session: Not on file  . Stress: Not on file  Relationships  . Social connections:    Talks on phone: Not on file    Gets together: Not on file    Attends religious service: Not on file    Active member of club or organization: Not on file    Attends meetings of  clubs or organizations: Not on file    Relationship status: Not on file  . Intimate partner violence:    Fear of current or ex partner: Not on file    Emotionally abused: Not on file    Physically abused: Not on file    Forced sexual activity: Not on file  Other Topics Concern  . Not on file  Social History Narrative  . Not on file    Past Surgical History:  Procedure Laterality Date  . ACHILLES TENDON REPAIR    . ANTERIOR CRUCIATE LIGAMENT REPAIR    . LUMBAR LAMINECTOMY/DECOMPRESSION MICRODISCECTOMY Right 09/19/2016   Procedure: Right Lumbar Five-Sacral one Laminotomy/Foraminotomy/removal of Synovial cyst;  Surgeon: [name], MD;  Location: [facility] OR;  Service: Neurosurgery;  Laterality: Right;  Right L5-S1 Laminotomy/Foraminotomy/removal of Synovial cyst  . POSTERIOR CERVICAL LAMINECTOMY N/A 06/12/2016     Procedure: Cervical One Posterior cervical laminectomy;  Surgeon: [name], MD;  Location: [facility] OR;  Service: Neurosurgery;  Laterality: N/A;  posterior  . TONSILLECTOMY      Family History  Problem Relation Age of Onset  . Diabetes Father   . Obesity Father     Allergies  Allergen Reactions  . No Known Allergies     Current Outpatient Medications on File Prior to Visit  Medication Sig Dispense Refill  . acetaminophen (TYLENOL) 500 MG tablet Take 1,000 mg by mouth every 6 (six) hours as needed (for pain.).    Marland Kitchen buPROPion (WELLBUTRIN SR) 150 MG 12 hr tablet Take 1 tablet (150 mg total) by mouth daily. 30 tablet 0  . diclofenac (VOLTAREN) 75 MG EC tablet TAKE 1 TABLET (75 MG TOTAL) BY MOUTH 2 (TWO) TIMES DAILY. 60 tablet 0  . losartan (COZAAR) 100 MG tablet TAKE 1 TABLET BY MOUTH EVERY DAY 30 tablet 2  . metFORMIN (GLUCOPHAGE) 500 MG tablet Take 1 tablet (500 mg total) by mouth 2 (two) times daily with a meal. 60 tablet 0  . Vitamin D, Ergocalciferol, (DRISDOL) 50000 units CAPS capsule Take 1 capsule (50,000 Units total) by mouth every 7 (seven) days. 4 capsule 0   No current facility-administered medications on file prior to visit.     BP 140/88   Pulse 99   Temp 98.3 F (36.8 C) (Oral)   Resp 16   Ht 5\' 10"  (1.778 m)   Wt (!) 423 lb 3.2 oz (192 kg)   SpO2 98%   BMI 60.72 kg/m       Objective:   Physical Exam   General Mental Status- Alert. General Appearance- Not in acute distress. Obese body habitus. Skin General: Color- Normal Color. Moisture- Normal Moisture.  Neck Carotid Arteries- Normal color. Moisture- Normal Moisture. No carotid bruits. No JVD.  Chest and Lung Exam Auscultation: Breath Sounds:-Normal.  Cardiovascular Auscultation:Rythm- Regular. Murmurs & Other Heart Sounds:Auscultation of the heart reveals- No Murmurs.  Abdomen Inspection:-Inspeection Normal. Palpation/Percussion:Note:No mass. Palpation and Percussion of the abdomen reveal-  Non Tender, Non Distended + BS, no rebound or guarding.   Neurologic Cranial Nerve exam:- CN III-XII intact(No nystagmus), symmetric smile. Strength:- 5/5 equal and symmetric strength both upper and lower extremities.  Back- midline lumbar spine scar.  Left thigh- no obvious severe tenderness to palpation of left thigh.  Lower ext- l5-s1 sensation intact bilaterally. Lower ext 5/5 equal strength.    Assessment & Plan:  For obesity continue with specialist. They might add other medications. Continue current weight loss meds.  For elevated blood sugar in past/prediabetes continue metformin.  Will check.  For sleep apnea continue cpap.  For htn continue current medications but check bp about 3 times a week.(you report better readings at home).  For recurrent thigh tingling type pain, I will refer you back to Dr. Danielle Dess since we need to make sure no recurrent issues with lower back.  For increased frequency of urination, I put in urine order and psa.  For high cholesterol will repeat lipid panel today.  Follow up date to determined.  40 minutes spent with pt today. 50% of time counseled/discussed numerous  prior established problems as well as new frequent urination.  Esperanza Richters, PA-C

## 2017-07-16 NOTE — Patient Instructions (Addendum)
For obesity continue with specialist. They might add other medications. Continue current weight loss meds.  For elevated blood sugar in past/prediabetes continue metformin. Will check.  For sleep apnea continue cpap.  For htn continue current medications but check bp about 3 times a week.(you report better readings at home).  For recurrent thigh tingling type pain, I will refer you back to Dr. Danielle DessElsner since we need to make sure no recurrent issues with lower back.  For increased frequency of urination, I put in urine order and psa.  For high cholesterol will repeat lipid panel today.  Follow up date to determined.

## 2017-07-18 ENCOUNTER — Other Ambulatory Visit (INDEPENDENT_AMBULATORY_CARE_PROVIDER_SITE_OTHER): Payer: Self-pay | Admitting: Family Medicine

## 2017-07-18 DIAGNOSIS — F3289 Other specified depressive episodes: Secondary | ICD-10-CM

## 2017-07-24 ENCOUNTER — Ambulatory Visit (INDEPENDENT_AMBULATORY_CARE_PROVIDER_SITE_OTHER): Payer: Managed Care, Other (non HMO) | Admitting: Family Medicine

## 2017-07-24 ENCOUNTER — Encounter (INDEPENDENT_AMBULATORY_CARE_PROVIDER_SITE_OTHER): Payer: Self-pay | Admitting: Family Medicine

## 2017-07-24 VITALS — BP 123/77 | HR 86 | Temp 98.0°F | Ht 70.0 in | Wt >= 6400 oz

## 2017-07-24 DIAGNOSIS — F3289 Other specified depressive episodes: Secondary | ICD-10-CM | POA: Diagnosis not present

## 2017-07-24 DIAGNOSIS — Z6841 Body Mass Index (BMI) 40.0 and over, adult: Secondary | ICD-10-CM

## 2017-07-24 DIAGNOSIS — Z9189 Other specified personal risk factors, not elsewhere classified: Secondary | ICD-10-CM | POA: Diagnosis not present

## 2017-07-24 MED ORDER — BUPROPION HCL ER (SR) 200 MG PO TB12: 200.0000 mg | ORAL_TABLET | Freq: Every day | ORAL | 0 refills | Status: DC

## 2017-07-24 NOTE — Progress Notes (Signed)
Office: [phone]  /  Fax: [phone]   HPI:   Chief Complaint: OBESITY Louis [name] is here to discuss his progress with his obesity treatment plan. He is on the keep a food journal with 1900 calories and 100 grams of protein daily and is following his eating plan approximately 90 % of the time. 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 Louis [name] struggled to follow the structures Category 4 plan and we changed to journaling. He has not done well with weight loss with journaling. He has limited insight as to why, but feels it is related to his decreased activity at work which should change soon.  His weight is (!) 416 lb (188.7 kg) today and has gained 2 pounds since his last visit. He has lost 0 lbs since starting treatment with us.  Depression with emotional eating behaviors Louis [name] started Wellbutrin but doesn't feel it is helping much and would like to look at other options. Louis [name] struggles with emotional eating and using food for comfort to the extent that it is negatively impacting his health. He often snacks when he is not hungry. Louis [name] sometimes feels he is out of control and then feels guilty that he made poor food choices. He has been working on behavior modification techniques to help reduce his emotional eating and has been somewhat successful. He shows no sign of suicidal or homicidal ideations.  [facility] 03/08/2017 04/17/2016  Decreased Interest 1 0  Down, Depressed, Hopeless 0 0  PHQ - 2 Score 1 0  Altered sleeping 2 0  Tired, decreased energy 3 0  Change in appetite 0 0  Feeling bad or failure about yourself  0 0  Trouble concentrating 0 0  Moving slowly or fidgety/restless 0 0  Suicidal thoughts 0 0  PHQ-9 Score 6 0  Difficult doing work/chores Somewhat difficult -    At risk for cardiovascular disease Louis [name] is at a higher than average risk for cardiovascular disease due to obesity. He currently denies any chest  pain.  ALLERGIES: Allergies  Allergen Reactions  . No Known Allergies     MEDICATIONS: Current Outpatient Medications on File Prior to Visit  Medication Sig Dispense Refill  . acetaminophen (TYLENOL) 500 MG tablet Take 1,000 mg by mouth every 6 (six) hours as needed (for pain.).    Marland Kitchen. diclofenac (VOLTAREN) 75 MG EC tablet TAKE 1 TABLET (75 MG TOTAL) BY MOUTH 2 (TWO) TIMES DAILY. 60 tablet 0  . losartan (COZAAR) 100 MG tablet TAKE 1 TABLET BY MOUTH EVERY DAY 30 tablet 2  . metFORMIN (GLUCOPHAGE) 500 MG tablet Take 1 tablet (500 mg total) by mouth 2 (two) times daily with a meal. 60 tablet 0  . Vitamin D, Ergocalciferol, (DRISDOL) 50000 units CAPS capsule Take 1 capsule (50,000 Units total) by mouth every 7 (seven) days. 4 capsule 0   No current facility-administered medications on file prior to visit.     PAST MEDICAL HISTORY: Past Medical History:  Diagnosis Date  . Allergy   . Arthritis    in both knees  . GERD (gastroesophageal reflux disease)   . Headache   . HTN (hypertension)   . Obesity     PAST SURGICAL HISTORY: Past Surgical History:  Procedure Laterality Date  . ACHILLES TENDON REPAIR    . ANTERIOR CRUCIATE LIGAMENT REPAIR    . LUMBAR LAMINECTOMY/DECOMPRESSION MICRODISCECTOMY Right 09/19/2016   Procedure: Right Lumbar Five-Sacral one Laminotomy/Foraminotomy/removal of Synovial cyst;  Surgeon: [name], MD;  Location: [facility];  Service: Neurosurgery;  Laterality: Right;  Right L5-S1 Laminotomy/Foraminotomy/removal of Synovial cyst  . POSTERIOR CERVICAL LAMINECTOMY N/A 06/12/2016   Procedure: Cervical One Posterior cervical laminectomy;  Surgeon: [name], MD;  Location: [facility] OR;  Service: Neurosurgery;  Laterality: N/A;  posterior  . TONSILLECTOMY      SOCIAL HISTORY: Social History   Tobacco Use  . Smoking status: Former Smoker    Packs/day: 0.25    Years: 3.00    Pack years: 0.75    Types: Cigarettes    Last attempt to quit: 06/14/2016    Years since  quitting: 1.1  . Smokeless tobacco: Never Used  Substance Use Topics  . 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  . Drug use: No    FAMILY HISTORY: Family History  Problem Relation Age of Onset  . Diabetes Father   . Obesity Father     ROS: Review of Systems  Constitutional: Negative for weight loss.  Cardiovascular: Negative for chest pain.  Psychiatric/Behavioral: Positive for depression. Negative for suicidal ideas.    PHYSICAL EXAM: Blood pressure 123/77, pulse 86, temperature 98 F (36.7 C), temperature source Oral, height 5\' 10"  (1.778 m), weight (!) 416 lb (188.7 kg), SpO2 98 %. Body mass index is 59.69 kg/m. Physical Exam  Constitutional: He is oriented to person, place, and time. He appears well-developed and well-nourished.  Cardiovascular: Normal rate.  Pulmonary/Chest: Effort normal.  Musculoskeletal: Normal range of motion.  Neurological: He is oriented to person, place, and time.  Skin: Skin is warm and dry.  Psychiatric: He has a normal mood and affect. His behavior is normal.  Vitals reviewed.   RECENT LABS AND TESTS: BMET    Component Value Date/Time   NA 139 07/16/2017 0911   NA 144 03/08/2017 0932   K 4.3 07/16/2017 0911   CL 101 07/16/2017 0911   CO2 30 07/16/2017 0911   GLUCOSE 98 07/16/2017 0911   BUN 10 07/16/2017 0911   BUN 12 03/08/2017 0932   CREATININE 1.13 07/16/2017 0911   CREATININE 0.94 11/15/2015 0833   CALCIUM 9.0 07/16/2017 0911   GFRNONAA 94 03/08/2017 0932   GFRNONAA >89 11/15/2015 0833   GFRAA 108 03/08/2017 0932   GFRAA >89 11/15/2015 0833   Lab Results  Component Value Date   HGBA1C 5.9 07/16/2017   HGBA1C 5.9 (H) 03/08/2017   HGBA1C 5.8 11/16/2015   Lab Results  Component Value Date   INSULIN 18.0 03/08/2017   CBC    Component Value Date/Time   WBC 8.6 03/08/2017 0932   WBC 8.8 09/14/2016 1140   RBC 4.63 03/08/2017 0932    RBC 4.89 09/14/2016 1140   HGB 13.2 03/08/2017 0932   HCT 41.5 03/08/2017 0932   PLT 285 03/08/2017 0932   MCV 90 03/08/2017 0932   MCH 28.5 03/08/2017 0932   MCH 27.8 09/14/2016 1140   MCHC 31.8 03/08/2017 0932   MCHC 31.8 09/14/2016 1140   RDW 14.7 03/08/2017 0932   LYMPHSABS 2.4 03/08/2017 0932   MONOABS 0.4 11/15/2015 0833   EOSABS 0.1 03/08/2017 0932   BASOSABS 0.0 03/08/2017 0932   Iron/TIBC/Ferritin/ %Sat No results found for: IRON, TIBC, FERRITIN, IRONPCTSAT Lipid Panel     Component Value Date/Time   CHOL 168 07/16/2017 0911   CHOL 157 03/08/2017 0932  TRIG 65.0 07/16/2017 0911   HDL 48.60 07/16/2017 0911   HDL 58 03/08/2017 0932   CHOLHDL 3 07/16/2017 0911   VLDL 13.0 07/16/2017 0911   LDLCALC 106 (H) 07/16/2017 0911   LDLCALC 85 03/08/2017 0932   Hepatic Function Panel     Component Value Date/Time   PROT 7.8 07/16/2017 0911   PROT 7.7 03/08/2017 0932   ALBUMIN 4.0 07/16/2017 0911   ALBUMIN 3.9 03/08/2017 0932   AST 18 07/16/2017 0911   ALT 24 07/16/2017 0911   ALKPHOS 84 07/16/2017 0911   BILITOT 0.3 07/16/2017 0911   BILITOT 0.3 03/08/2017 0932      Component Value Date/Time   TSH 1.260 03/08/2017 0932   TSH 1.07 11/15/2015 0833    ASSESSMENT AND PLAN: Other depression - with emotional eating - Plan: buPROPion (WELLBUTRIN SR) 200 MG 12 hr tablet  At risk for heart disease  Class 3 severe obesity with serious comorbidity and body mass index (BMI) of 50.0 to 59.9 in adult, unspecified obesity type (HCC)  PLAN:  Depression with Emotional Eating Behaviors We discussed behavior modification techniques today to help Rockne deal with his emotional eating and depression. Ann agrees to increase Wellbutrin SR to 200 mg q AM #30 with no refills. Jonas agrees to follow up with our clinic in 2 weeks with Alois Cliche, PA-C.  Cardiovascular risk counselling Leovanni was given extended (15 minutes) coronary artery disease prevention counseling today. He is  42 y.o. male and has risk factors for heart disease including obesity. We discussed intensive lifestyle modifications today with an emphasis on specific weight loss instructions and strategies. Pt was also informed of the importance of increasing exercise and decreasing saturated fats to help prevent heart disease.  Obesity Oswald is currently in the action stage of change. As such, his goal is to continue with weight loss efforts He has agreed to change to follow a lower carbohydrate, vegetable and lean protein rich diet plan Nicholaos has been instructed to work up to a goal of 150 minutes of combined cardio and strengthening exercise per week for weight loss and overall health benefits. We discussed the following Behavioral Modification Strategies today: increasing lean protein intake, decreasing simple carbohydrates, increase H20 intake, and work on meal planning and easy cooking plans   Syler has agreed to follow up with our clinic in 2 weeks with Alois Cliche, PA-C. He was informed of the importance of frequent follow up visits to maximize his success with intensive lifestyle modifications for his multiple health conditions.   OBESITY BEHAVIORAL INTERVENTION VISIT  Today's visit was # 8 out of 22.  Starting weight: 406 lbs Starting date: 03/08/17 Today's weight : 416 lbs  Today's date: 07/24/2017 Total lbs lost to date: 0    ASK: We discussed the diagnosis of obesity with [name] today and Louis Peon agreed to give [name] permission to discuss obesity behavioral modification therapy today.  ASSESS: Davaris has the diagnosis of obesity and his BMI today is 59.69 Shadrick is in the action stage of change   ADVISE: Najee was educated on the multiple health risks of obesity as well as the benefit of weight loss to improve his health. He was advised of the need for long term treatment and the importance of lifestyle modifications.  AGREE: Multiple dietary modification options and treatment  options were discussed and  Sandon agreed to the above obesity treatment plan.  Trude Mcburney, am acting as transcriptionist for Quillian Quince, MD  I  have reviewed the above documentation for accuracy and completeness, and I agree with the above. -Dennard Nip, MD

## 2017-08-04 ENCOUNTER — Other Ambulatory Visit: Payer: Self-pay | Admitting: Medical

## 2017-08-08 ENCOUNTER — Encounter (INDEPENDENT_AMBULATORY_CARE_PROVIDER_SITE_OTHER): Payer: Self-pay

## 2017-08-08 ENCOUNTER — Other Ambulatory Visit (INDEPENDENT_AMBULATORY_CARE_PROVIDER_SITE_OTHER): Payer: Self-pay

## 2017-08-08 ENCOUNTER — Ambulatory Visit (INDEPENDENT_AMBULATORY_CARE_PROVIDER_SITE_OTHER): Payer: Self-pay | Admitting: Physician Assistant

## 2017-08-08 ENCOUNTER — Telehealth (INDEPENDENT_AMBULATORY_CARE_PROVIDER_SITE_OTHER): Payer: Self-pay | Admitting: Physician Assistant

## 2017-08-08 DIAGNOSIS — E559 Vitamin D deficiency, unspecified: Secondary | ICD-10-CM

## 2017-08-08 MED ORDER — VITAMIN D (ERGOCALCIFEROL) 1.25 MG (50000 UNIT) PO CAPS: 50000.0000 [IU] | ORAL_CAPSULE | ORAL | 0 refills | Status: DC

## 2017-08-08 NOTE — Telephone Encounter (Signed)
Patient states he needs a refill of his Vit D, [facility]  His appt was r/s from today (8/7) to 8/19. Thank you. Selena BattenKim

## 2017-08-08 NOTE — Telephone Encounter (Signed)
Refill sent into pharmacy.  Ellery PlunkShanon Wright, CMA

## 2017-08-11 ENCOUNTER — Other Ambulatory Visit: Payer: Self-pay | Admitting: Medical

## 2017-08-20 ENCOUNTER — Ambulatory Visit (INDEPENDENT_AMBULATORY_CARE_PROVIDER_SITE_OTHER): Payer: Managed Care, Other (non HMO) | Admitting: Physician Assistant

## 2017-08-20 VITALS — BP 152/78 | HR 95 | Temp 98.0°F | Ht 70.0 in | Wt >= 6400 oz

## 2017-08-20 DIAGNOSIS — F3289 Other specified depressive episodes: Secondary | ICD-10-CM

## 2017-08-20 DIAGNOSIS — R7303 Prediabetes: Secondary | ICD-10-CM | POA: Diagnosis not present

## 2017-08-20 DIAGNOSIS — Z9189 Other specified personal risk factors, not elsewhere classified: Secondary | ICD-10-CM | POA: Diagnosis not present

## 2017-08-20 DIAGNOSIS — Z6841 Body Mass Index (BMI) 40.0 and over, adult: Secondary | ICD-10-CM

## 2017-08-20 MED ORDER — BUPROPION HCL ER (SR) 200 MG PO TB12: 200.0000 mg | ORAL_TABLET | Freq: Every day | ORAL | 0 refills | Status: DC

## 2017-08-20 MED ORDER — METFORMIN HCL 500 MG PO TABS: 500.0000 mg | ORAL_TABLET | Freq: Two times a day (BID) | ORAL | 0 refills | Status: DC

## 2017-08-20 NOTE — Progress Notes (Signed)
Office: [phone]  /  Fax: [phone]   HPI:   Chief Complaint: OBESITY Louis [name] is here to discuss his progress with his obesity treatment plan. He is on the lower carbohydrate, vegetable and lean protein rich diet plan and is following his eating plan approximately 90 % of the time. He states he is walking for 30 minutes 2 times per week. Louis [name] has done well with weight loss. He reports following the low carbohydrate plan closely. He would like to continue with the same plan.  His weight is (!) 411 lb (186.4 kg) today and has had a weight loss of 5 pounds over a period of 4 weeks since his last visit. He has lost 0 lbs since starting treatment with us.  Pre-Diabetes Louis [name] has a diagnosis of pre-diabetes based on his elevated Hgb A1c and was informed this puts him at greater risk of developing diabetes. He is on metformin and denies hypoglycemia or polyphagia. He continues to work on diet and exercise to decrease risk of diabetes.  At risk for diabetes Louis [name] is at higher than average risk for developing diabetes due to his obesity and pre-diabetes. He currently denies polyuria or polydipsia.  Depression with emotional eating behaviors Louis [name] reports no emotional eating or cravings. Louis [name] struggles with emotional eating and using food for comfort to the extent that it is negatively impacting his health. He often snacks when he is not hungry. Louis [name] sometimes feels he is out of control and then feels guilty that he made poor food choices. He has been working on behavior modification techniques to help reduce his emotional eating and has been somewhat successful. He shows no sign of suicidal or homicidal ideations.  [facility] 03/08/2017 04/17/2016  Decreased Interest 1 0  Down, Depressed, Hopeless 0 0  PHQ - 2 Score 1 0  Altered sleeping 2 0  Tired, decreased energy 3 0  Change in appetite 0 0  Feeling bad or failure about yourself  0 0  Trouble concentrating 0 0  Moving  slowly or fidgety/restless 0 0  Suicidal thoughts 0 0  PHQ-9 Score 6 0  Difficult doing work/chores Somewhat difficult -    ALLERGIES: Allergies  Allergen Reactions  . No Known Allergies     MEDICATIONS: Current Outpatient Medications on File Prior to Visit  Medication Sig Dispense Refill  . acetaminophen (TYLENOL) 500 MG tablet Take 1,000 mg by mouth every 6 (six) hours as needed (for pain.).    Marland Kitchen. diclofenac (VOLTAREN) 75 MG EC tablet TAKE 1 TABLET (75 MG TOTAL) BY MOUTH 2 (TWO) TIMES DAILY. 60 tablet 0  . losartan (COZAAR) 100 MG tablet TAKE 1 TABLET BY MOUTH EVERY DAY 30 tablet 2  . Vitamin D, Ergocalciferol, (DRISDOL) 50000 units CAPS capsule Take 1 capsule (50,000 Units total) by mouth every 7 (seven) days. 4 capsule 0   No current facility-administered medications on file prior to visit.     PAST MEDICAL HISTORY: Past Medical History:  Diagnosis Date  . Allergy   . Arthritis    in both knees  . GERD (gastroesophageal reflux disease)   . Headache   . HTN (hypertension)   . Obesity     PAST SURGICAL HISTORY: Past Surgical History:  Procedure Laterality Date  . ACHILLES TENDON REPAIR    . ANTERIOR CRUCIATE LIGAMENT REPAIR    . LUMBAR LAMINECTOMY/DECOMPRESSION MICRODISCECTOMY Right 09/19/2016   Procedure: Right Lumbar Five-Sacral one Laminotomy/Foraminotomy/removal of Synovial cyst;  Surgeon: Barnett AbuElsner, Henry, MD;  Location:  [facility] OR;  Service: Neurosurgery;  Laterality: Right;  Right L5-S1 Laminotomy/Foraminotomy/removal of Synovial cyst  . POSTERIOR CERVICAL LAMINECTOMY N/A 06/12/2016   Procedure: Cervical One Posterior cervical laminectomy;  Surgeon: [name], MD;  [facility] OR;  Service: Neurosurgery;  Laterality: N/A;  posterior  . TONSILLECTOMY      SOCIAL HISTORY: Social History   Tobacco Use  . Smoking status: Former Smoker    Packs/day: 0.25    Years: 3.00    Pack years: 0.75    Types: Cigarettes    Last attempt to quit: 06/14/2016    Years since  quitting: 1.1  . Smokeless tobacco: Never Used  Substance Use Topics  . 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  . Drug use: No    FAMILY HISTORY: Family History  Problem Relation Age of Onset  . Diabetes Father   . Obesity Father     ROS: Review of Systems  Constitutional: Positive for weight loss.  Genitourinary: Negative for frequency.  Endo/Heme/Allergies: Negative for polydipsia.       Negative hypoglycemia Negative polyphagia  Psychiatric/Behavioral: Positive for depression. Negative for suicidal ideas.    PHYSICAL EXAM: Blood pressure (!) 152/78, pulse 95, temperature 98 F (36.7 C), temperature source Oral, height 5\' 10"  (1.778 m), weight (!) 411 lb (186.4 kg), SpO2 96 %. Body mass index is 58.97 kg/m. Physical Exam  Constitutional: He is oriented to person, place, and time. He appears well-developed and well-nourished.  Cardiovascular: Normal rate.  Pulmonary/Chest: Effort normal.  Musculoskeletal: Normal range of motion.  Neurological: He is oriented to person, place, and time.  Skin: Skin is warm and dry.  Psychiatric: He has a normal mood and affect. His behavior is normal.  Vitals reviewed.   RECENT LABS AND TESTS: BMET    Component Value Date/Time   NA 139 07/16/2017 0911   NA 144 03/08/2017 0932   K 4.3 07/16/2017 0911   CL 101 07/16/2017 0911   CO2 30 07/16/2017 0911   GLUCOSE 98 07/16/2017 0911   BUN 10 07/16/2017 0911   BUN 12 03/08/2017 0932   CREATININE 1.13 07/16/2017 0911   CREATININE 0.94 11/15/2015 0833   CALCIUM 9.0 07/16/2017 0911   GFRNONAA 94 03/08/2017 0932   GFRNONAA >89 11/15/2015 0833   GFRAA 108 03/08/2017 0932   GFRAA >89 11/15/2015 0833   Lab Results  Component Value Date   HGBA1C 5.9 07/16/2017   HGBA1C 5.9 (H) 03/08/2017   HGBA1C 5.8 11/16/2015   Lab Results  Component Value Date   INSULIN 18.0 03/08/2017   CBC     Component Value Date/Time   WBC 8.6 03/08/2017 0932   WBC 8.8 09/14/2016 1140   RBC 4.63 03/08/2017 0932   RBC 4.89 09/14/2016 1140   HGB 13.2 03/08/2017 0932   HCT 41.5 03/08/2017 0932   PLT 285 03/08/2017 0932   MCV 90 03/08/2017 0932   MCH 28.5 03/08/2017 0932   MCH 27.8 09/14/2016 1140   MCHC 31.8 03/08/2017 0932   MCHC 31.8 09/14/2016 1140   RDW 14.7 03/08/2017 0932   LYMPHSABS 2.4 03/08/2017 0932   MONOABS 0.4 11/15/2015 0833   EOSABS 0.1 03/08/2017 0932   BASOSABS 0.0 03/08/2017 0932   Iron/TIBC/Ferritin/ %Sat No results found for: IRON, TIBC, FERRITIN, IRONPCTSAT Lipid Panel     Component Value Date/Time   CHOL 168  07/16/2017 0911   CHOL 157 03/08/2017 0932   TRIG 65.0 07/16/2017 0911   HDL 48.60 07/16/2017 0911   HDL 58 03/08/2017 0932   CHOLHDL 3 07/16/2017 0911   VLDL 13.0 07/16/2017 0911   LDLCALC 106 (H) 07/16/2017 0911   LDLCALC 85 03/08/2017 0932   Hepatic Function Panel     Component Value Date/Time   PROT 7.8 07/16/2017 0911   PROT 7.7 03/08/2017 0932   ALBUMIN 4.0 07/16/2017 0911   ALBUMIN 3.9 03/08/2017 0932   AST 18 07/16/2017 0911   ALT 24 07/16/2017 0911   ALKPHOS 84 07/16/2017 0911   BILITOT 0.3 07/16/2017 0911   BILITOT 0.3 03/08/2017 0932      Component Value Date/Time   TSH 1.260 03/08/2017 0932   TSH 1.07 11/15/2015 0833    ASSESSMENT AND PLAN: Prediabetes - Plan: metFORMIN (GLUCOPHAGE) 500 MG tablet  Other depression - with emotional eating  - Plan: buPROPion (WELLBUTRIN SR) 200 MG 12 hr tablet  At risk for diabetes mellitus  Class 3 severe obesity with serious comorbidity and body mass index (BMI) of 50.0 to 59.9 in adult, unspecified obesity type (HCC)  PLAN:  Pre-Diabetes Louis [name] will continue to work on weight loss, exercise, and decreasing simple carbohydrates in his diet to help decrease the risk of diabetes. We dicussed metformin including benefits and risks. He was informed that eating too many simple carbohydrates  or too many calories at one sitting increases the likelihood of GI side effects. Louis [name] agrees to continue taking metformin 500 mg BID #60 and we will refill for 1 month. Louis [name] agrees to follow up with our clinic in 3 weeks as directed to monitor his progress.  Diabetes risk counselling Louis [name] was given extended (15 minutes) diabetes prevention counseling today. He is 42 y.o. male and has risk factors for diabetes including obesity and pre-diabetes. We discussed intensive lifestyle modifications today with an emphasis on weight loss as well as increasing exercise and decreasing simple carbohydrates in his diet.  Depression with Emotional Eating Behaviors We discussed behavior modification techniques today to help Louis [name] deal with his emotional eating and depression. Louis [name] agrees to continue taking Wellbutrin SR 200 mg qd #30 and we will refill for 1 month. Louis [name] agrees to follow up with our clinic in 3 weeks.  Obesity Louis [name] is currently in the action stage of change. As such, his goal is to continue with weight loss efforts He has agreed to follow a lower carbohydrate, vegetable and lean protein rich diet plan Louis [name] has been instructed to work up to a goal of 150 minutes of combined cardio and strengthening exercise per week for weight loss and overall health benefits. We discussed the following Behavioral Modification Strategies today: work on meal planning and easy cooking plans and planning for success   Louis [name] has agreed to follow up with our clinic in 3 weeks. He was informed of the importance of frequent follow up visits to maximize his success with intensive lifestyle modifications for his multiple health conditions.   OBESITY BEHAVIORAL INTERVENTION VISIT  Today's visit was # 9 out of 22.  Starting weight: 406 lbs Starting date: 03/08/17 Today's weight : 411 lbs  Today's date: 08/20/2017 Total lbs lost to date: 0    ASK: We discussed the diagnosis of obesity with Louis [name]  today and Louis [name] agreed to give [name] permission to discuss obesity behavioral modification therapy today.  ASSESS: Louis [name] has the diagnosis of obesity and his BMI today is 58.97 Louis [name]  is in the action stage of change   ADVISE: Louis [name] was educated on the multiple health risks of obesity as well as the benefit of weight loss to improve his health. He was advised of the need for long term treatment and the importance of lifestyle modifications.  AGREE: Multiple dietary modification options and treatment options were discussed and  Louis [name] agreed to the above obesity treatment plan.  Trude McburneyI, Sharon Martin, am acting as transcriptionist for Alois Clicheracey Molly Savarino, PA-C I, Alois Clicheracey Abagayle Klutts, PA-C have reviewed above note and agree with its content

## 2017-09-10 ENCOUNTER — Ambulatory Visit (INDEPENDENT_AMBULATORY_CARE_PROVIDER_SITE_OTHER): Payer: Managed Care, Other (non HMO) | Admitting: Physician Assistant

## 2017-09-12 ENCOUNTER — Other Ambulatory Visit (INDEPENDENT_AMBULATORY_CARE_PROVIDER_SITE_OTHER): Payer: Self-pay | Admitting: Family Medicine

## 2017-09-12 DIAGNOSIS — E559 Vitamin D deficiency, unspecified: Secondary | ICD-10-CM

## 2017-09-14 ENCOUNTER — Other Ambulatory Visit: Payer: Self-pay | Admitting: Medical

## 2017-09-17 ENCOUNTER — Ambulatory Visit: Payer: Managed Care, Other (non HMO) | Admitting: Neurology

## 2017-09-17 ENCOUNTER — Ambulatory Visit (INDEPENDENT_AMBULATORY_CARE_PROVIDER_SITE_OTHER): Payer: Managed Care, Other (non HMO) | Admitting: Physician Assistant

## 2017-09-17 ENCOUNTER — Encounter: Payer: Self-pay | Admitting: Neurology

## 2017-09-17 VITALS — BP 178/94 | HR 71 | Ht 72.0 in | Wt >= 6400 oz

## 2017-09-17 VITALS — BP 126/74 | HR 70 | Temp 97.6°F | Ht 72.0 in | Wt >= 6400 oz

## 2017-09-17 DIAGNOSIS — E559 Vitamin D deficiency, unspecified: Secondary | ICD-10-CM

## 2017-09-17 DIAGNOSIS — G4734 Idiopathic sleep related nonobstructive alveolar hypoventilation: Secondary | ICD-10-CM | POA: Diagnosis not present

## 2017-09-17 DIAGNOSIS — I10 Essential (primary) hypertension: Secondary | ICD-10-CM | POA: Diagnosis not present

## 2017-09-17 DIAGNOSIS — F3289 Other specified depressive episodes: Secondary | ICD-10-CM

## 2017-09-17 DIAGNOSIS — G4733 Obstructive sleep apnea (adult) (pediatric): Secondary | ICD-10-CM

## 2017-09-17 DIAGNOSIS — Z9189 Other specified personal risk factors, not elsewhere classified: Secondary | ICD-10-CM | POA: Diagnosis not present

## 2017-09-17 DIAGNOSIS — Z9989 Dependence on other enabling machines and devices: Secondary | ICD-10-CM

## 2017-09-17 DIAGNOSIS — Z6841 Body Mass Index (BMI) 40.0 and over, adult: Secondary | ICD-10-CM

## 2017-09-17 MED ORDER — LIRAGLUTIDE -WEIGHT MANAGEMENT 18 MG/3ML ~~LOC~~ SOPN: 0.3000 mg | PEN_INJECTOR | Freq: Every day | SUBCUTANEOUS | 0 refills | Status: DC

## 2017-09-17 MED ORDER — LOSARTAN POTASSIUM 100 MG PO TABS: 100.0000 mg | ORAL_TABLET | Freq: Every day | ORAL | 0 refills | Status: DC

## 2017-09-17 MED ORDER — INSULIN PEN NEEDLE 32G X 4 MM MISC: 1.0000 | Freq: Two times a day (BID) | 0 refills | Status: DC

## 2017-09-17 MED ORDER — VITAMIN D (ERGOCALCIFEROL) 1.25 MG (50000 UNIT) PO CAPS: 50000.0000 [IU] | ORAL_CAPSULE | ORAL | 0 refills | Status: DC

## 2017-09-17 NOTE — Progress Notes (Signed)
[facility] and [facility]

## 2017-09-17 NOTE — Progress Notes (Signed)
Subjective:    Patient ID: Louis [name] is a 42 y.o. male.  HPI     Interim history:   Louis [name] is a 42 year old right-handed gentleman with an underlying medical history of hypertension, reflux disease, arthritis, allergies, recurrent headaches, status post neck and lower back surgery, smoking with recent cessation, and morbid obesity with a BMI of over 79, who presents for follow-up consultation of his obstructive sleep apnea, after recent home sleep testing and starting AutoPap therapy. The patient is unaccompanied today. I first met him on 04/30/2017 at the request of Dr. Adair Patter , at which time he reported shortness of breath as well as daytime somnolence and snoring. His Epworth sleepiness score was 12 out of 24 at the time. He was advised to proceed with sleep study testing. His insurance denied a lab attendant sleep study. He had a home sleep test on 06/04/2017 which indicated overall mild obstructive sleep apnea but number of events with an AHI of 10.6 per hour, average oxygen saturation of 94%, nadir of 72% with time below or at 88% saturation of over 30 minutes. He was advised to proceed with AutoPap treatment at home as his insurance had denied a lab attendant sleep study.  Today, 09/17/2017: I reviewed his AutoPap compliance data from 08/14/2017 through 09/12/2017 which is a total of 30 days, during which time he used his AutoPap 26 days with percent used days greater than 4 hours at 63% which is slightly suboptimal, average usage of 5 hours, residual AHI 4.2 per hour, leak acceptable with the 95th percentile at 13.5 L/m with the 95th percentile pressure at 13.1 cm on the range of 10 cm to 14 cm. In the month of early July through early August, 07/12/2017 through 08/10/2017 his compliance for more than 4 hours was very good at 87% with an average usage of 5 hours and 35 minutes, average pressure about the same, average leakage about the same, average AHI little better at 3.2 per hour  at the time. He reports feeling better, better rested, even HAs are better. And, per wife, snoring is gone. He uses a FFM, after trying a nasal interface.   Previously:  04/30/2017: (He) reports snoring and excessive daytime somnolence as well as shortness of breath. I reviewed your office note from 04/09/2017. His Epworth sleepiness score is 12 out of 24 today, fatigue score is 14 out of 63. He is married and lives with his wife. They have 7 children between the 2 of them, he has 3 biological children. He quit smoking last year, very rare EtOH and used to drink heavily, by self-admission.  His bedtime is generally between 9:30 and 10, rise time is early around 4:30. He has continued. He works in Architect. When sedentary, he may have a tendency to doze off, he has never fallen asleep wheel. He had a tonsillectomy and adenoidectomy as a child, has snored even in childhood. His weight loss has stagnated he feels. He has considered weight loss surgery. He has nocturia about twice per average night and has recurrent morning headaches which he describes as dull and achy, he used to have migraines which were different. He has had witnessed apneas per wife's report.  His Past Medical History Is Significant For: Past Medical History:  Diagnosis Date  . Allergy   . Arthritis    in both knees  . GERD (gastroesophageal reflux disease)   . Headache   . HTN (hypertension)   . Obesity  His Past Surgical History Is Significant For: Past Surgical History:  Procedure Laterality Date  . ACHILLES TENDON REPAIR    . ANTERIOR CRUCIATE LIGAMENT REPAIR    . LUMBAR LAMINECTOMY/DECOMPRESSION MICRODISCECTOMY Right 09/19/2016   Procedure: Right Lumbar Five-Sacral one Laminotomy/Foraminotomy/removal of Synovial cyst;  Surgeon: Kristeen Miss, MD;  Location: [facility];  Service: Neurosurgery;  Laterality: Right;  Right L5-S1 Laminotomy/Foraminotomy/removal of Synovial cyst  . POSTERIOR CERVICAL LAMINECTOMY N/A  06/12/2016   Procedure: Cervical One Posterior cervical laminectomy;  Surgeon: Kristeen Miss, MD;  Location: [facility];  Service: Neurosurgery;  Laterality: N/A;  posterior  . TONSILLECTOMY      His Family History Is Significant For: Family History  Problem Relation Age of Onset  . Diabetes Father   . Obesity Father     His Social History Is Significant For: Social History   Socioeconomic History  . Marital status: Married    Spouse name: Lenvil Swaim  . Number of children: 7  . Years of education: Not on file  . Highest education level: Not on file  Occupational History  . Occupation: Nature conservation officer  Social Needs  . Financial resource strain: Not on file  . Food insecurity:    Worry: Not on file    Inability: Not on file  . Transportation needs:    Medical: Not on file    Non-medical: Not on file  Tobacco Use  . Smoking status: Former Smoker    Packs/day: 0.25    Years: 3.00    Pack years: 0.75    Types: Cigarettes    Last attempt to quit: 06/14/2016    Years since quitting: 1.2  . Smokeless tobacco: Never Used  Substance and Sexual Activity  . 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  . Drug use: No  . Sexual activity: Yes  Lifestyle  . Physical activity:    Days per week: Not on file    Minutes per session: Not on file  . Stress: Not on file  Relationships  . Social connections:    Talks on phone: Not on file    Gets together: Not on file    Attends religious service: Not on file    Active member of club or organization: Not on file    Attends meetings of clubs or organizations: Not on file    Relationship status: Not on file  Other Topics Concern  . Not on file  Social History Narrative  . Not on file    His Allergies Are:  Allergies  Allergen Reactions  . No Known Allergies   :   His Current Medications Are:  Outpatient Encounter Medications as of  09/17/2017  Medication Sig  . acetaminophen (TYLENOL) 500 MG tablet Take 1,000 mg by mouth every 6 (six) hours as needed (for pain.).  Marland Kitchen buPROPion (WELLBUTRIN SR) 200 MG 12 hr tablet Take 1 tablet (200 mg total) by mouth daily at 12 noon.  . diclofenac (VOLTAREN) 75 MG EC tablet TAKE 1 TABLET (75 MG TOTAL) BY MOUTH 2 (TWO) TIMES DAILY.  Marland Kitchen losartan (COZAAR) 100 MG tablet TAKE 1 TABLET BY MOUTH EVERY DAY  . metFORMIN (GLUCOPHAGE) 500 MG tablet Take 1 tablet (500 mg total) by mouth 2 (two) times daily with a meal.  . Vitamin D, Ergocalciferol, (DRISDOL) 50000 units CAPS capsule Take 1 capsule (50,000 Units total)  by mouth every 7 (seven) days.   No facility-administered encounter medications on file as of 09/17/2017.   :  Review of Systems:  Out of a complete 14 point review of systems, all are reviewed and negative with the exception of these symptoms as listed below: Review of Systems  Neurological:       Pt presents today to discuss his cpap. Pt reports that his cpap is going well. He is interested in a travel cpap.    Objective:  Neurological Exam  Physical Exam Physical Examination:   Vitals:   09/17/17 0926  BP: (!) 178/94  Pulse: 71    General Examination: The patient is a very pleasant 42 y.o. male in no acute distress. He appears well-developed and well-nourished and well groomed.   HEENT: Normocephalic, atraumatic, pupils are equal, round and reactive to light and accommodation. Extraocular tracking is good without limitation to gaze excursion or nystagmus noted. Normal smooth pursuit is noted. Hearing is grossly intact. Face is symmetric with normal facial animation and normal facial sensation. Speech is clear with no dysarthria noted. There is no hypophonia. There is no lip, neck/head, jaw or voice tremor. Neck is supple with full range of passive and active motion. Oropharynx exam reveals: mild to moderate mouth dryness, adequate dental hygiene and moderate airway crowding.    Chest: Clear to auscultation without wheezing, rhonchi or crackles noted.  Heart: S1+S2+0, regular and normal without murmurs, rubs or gallops noted.   Abdomen: Soft, non-tender and non-distended.  Extremities: There is no edema.   Skin: Warm and dry without trophic changes noted.  Musculoskeletal: exam reveals no obvious joint deformities, tenderness or joint swelling or erythema.   Neurologically:  Mental status: The patient is awake, alert and oriented in all 4 spheres. His immediate and remote memory, attention, language skills and fund of knowledge are appropriate. There is no evidence of aphasia, agnosia, apraxia or anomia. Speech is clear with normal prosody and enunciation. Thought process is linear. Mood is normal and affect is normal.  Cranial nerves II - XII are as described above under HEENT exam.  Motor exam: Normal bulk, strength and tone is noted. There is no tremor. Fine motor skills and coordination: grossly intact.  Cerebellar testing: No dysmetria or intention tremor.  Sensory exam: intact to light touch in the upper and lower extremities.  Gait, station and balance: He stands easily. No veering to one side is noted. No leaning to one side is noted. Posture is age-appropriate and stance is narrow based. Gait shows normal stride length and normal pace.   Assessment and Plan:   In summary, Louis [name] is a very pleasant 42 year old male with an underlying medical history of hypertension, reflux disease, arthritis, allergies, recurrent headaches, status post neck and lower back surgery, recent smoking cessation, and morbid obesity with a BMI of over 50, who presents for follow up consultation of his sleep apnea. He had a home sleep test on 06/04/2017 with an AHI 10.6 per hour, O2 nadir of 72%. He has established treatment with AutoPap therapy and indicates good results including better sleep quality, sleep consolidation and less headaches. He is quite pleased  with his outcome thus far and commended for his treatment adherence. He would like to purchase a travel machine. I wrote a prescription for a travel AutoPap or a travel CPAP which we can set at a pressure of 13 cm. He is successfully using a fullface mask. Given his lower oxygen saturation strength a  home sleep test with a nadir of 72%, average oxygen saturation of 94% and time below or at 88% saturation of 36 minutes, I suggested we proceed with an overnight pulse oximetry test while he uses his AutoPap. We can arrange for a test 1 night of his choosing through his DME company. We will call him with his test results. If all goes well he can continue with AutoPap. I would like to see him back in 6 months, sooner if needed. I answered all his questions today and he was in agreement.

## 2017-09-17 NOTE — Patient Instructions (Addendum)
Please continue using your autoPAP regularly. While your insurance requires that you use PAP at least 4 hours each night on 70% of the nights, I recommend, that you not skip any nights and use it throughout the night if you can. Getting used to PAP and staying with the treatment long term does take time and patience and discipline. Untreated obstructive sleep apnea when it is moderate to severe can have an adverse impact on cardiovascular health and raise her risk for heart disease, arrhythmias, hypertension, congestive heart failure, stroke and diabetes. Untreated obstructive sleep apnea causes sleep disruption, nonrestorative sleep, and sleep deprivation. This can have an impact on your day to day functioning and cause daytime sleepiness and impairment of cognitive function, memory loss, mood disturbance, and problems focussing. Using PAP regularly can improve these symptoms.  I will write a prescription for a travel CPAP machine and set the pressure to 13 cm.   As discussed, we will do an overnight oxygen level test, called ONO, and your DME company will call and set this up for one night, while you also use your autoPAP as usual. We will call you with the results. This is to make sure that your oxygen levels stay in the 90s, while you are treated with autoPAP for your OSA. Remember, your oxygen levels dropped into the 70s during the home sleep test.

## 2017-09-19 NOTE — Progress Notes (Signed)
Office: [phone]  /  Fax: [phone]   HPI:   Chief Complaint: OBESITY Louis [name] is here to discuss his progress with his obesity treatment plan. He is on the lower carbohydrate, vegetable and lean protein rich diet plan and is following his eating plan approximately 85 % of the time. He states he is walking 35 minutes 3 times per week. Louis [name] did well with maintaining his weight. He reports that he is following the plan closely and is frustrated with the lack of weight loss. He is interested in having weight loss surgery. He is also asking about possible weight loss medications. His weight is (!) 411 lb (186.4 kg) today and he has maintained weight over a period of 4 weeks since his last visit. He has lost 0 lbs since starting treatment with [name].  Hypertension [name] is a 42 y.o. male with hypertension. [name] denies chest pain. He is working weight loss to help control his blood pressure with the goal of decreasing his risk of heart attack and stroke. Averys blood pressure is is normal.  Vitamin D deficiency [name] has a diagnosis of vitamin D deficiency. He is currently taking vit D and denies nausea, vomiting or muscle weakness.  At risk for osteopenia and osteoporosis [name] is at higher risk of osteopenia and osteoporosis due to vitamin D deficiency.   Depression with emotional eating behaviors [name] struggles with emotional eating and using food for comfort to the extent that it is negatively impacting his health. He often snacks when he is not hungry. [name] sometimes feels he is out of control and then feels guilty that he made poor food choices. [name] has no emotional eating currently and he reports that he does not have cravings. Miller reports that he has not seen a difference with this meal plan. He has been working on behavior modification techniques to help reduce his emotional eating and has been somewhat successful. He shows no sign of suicidal or homicidal  ideations.  Depression screen [facility] 2/9 03/08/2017 04/17/2016  Decreased Interest 1 0  Down, Depressed, Hopeless 0 0  PHQ - 2 Score 1 0  Altered sleeping 2 0  Tired, decreased energy 3 0  Change in appetite 0 0  Feeling bad or failure about yourself  0 0  Trouble concentrating 0 0  Moving slowly or fidgety/restless 0 0  Suicidal thoughts 0 0  PHQ-9 Score 6 0  Difficult doing work/chores Somewhat difficult -     ALLERGIES: Allergies  Allergen Reactions  . No Known Allergies     MEDICATIONS: Current Outpatient Medications on File Prior to Visit  Medication Sig Dispense Refill  . acetaminophen (TYLENOL) 500 MG tablet Take 1,000 mg by mouth every 6 (six) hours as needed (for pain.).    Marland Kitchen diclofenac (VOLTAREN) 75 MG EC tablet TAKE 1 TABLET (75 MG TOTAL) BY MOUTH 2 (TWO) TIMES DAILY. 60 tablet 0  . metFORMIN (GLUCOPHAGE) 500 MG tablet Take 1 tablet (500 mg total) by mouth 2 (two) times daily with a meal. 60 tablet 0   No current facility-administered medications on file prior to visit.     PAST MEDICAL HISTORY: Past Medical History:  Diagnosis Date  . Allergy   . Arthritis    in both knees  . GERD (gastroesophageal reflux disease)   . Headache   . HTN (hypertension)   . Obesity     PAST SURGICAL HISTORY: Past Surgical History:  Procedure Laterality Date  . ACHILLES TENDON  REPAIR    . ANTERIOR CRUCIATE LIGAMENT REPAIR    . LUMBAR LAMINECTOMY/DECOMPRESSION MICRODISCECTOMY Right 09/19/2016   Procedure: Right Lumbar Five-Sacral one Laminotomy/Foraminotomy/removal of Synovial cyst;  Surgeon: [name], MD;  Location: [facility] OR;  Service: Neurosurgery;  Laterality: Right;  Right L5-S1 Laminotomy/Foraminotomy/removal of Synovial cyst  . POSTERIOR CERVICAL LAMINECTOMY N/A 06/12/2016   Procedure: Cervical One Posterior cervical laminectomy;  Surgeon: [name], MD;  Location: [facility];  Service: Neurosurgery;  Laterality: N/A;  posterior  . TONSILLECTOMY      SOCIAL  HISTORY: Social History   Tobacco Use  . Smoking status: Former Smoker    Packs/day: 0.25    Years: 3.00    Pack years: 0.75    Types: Cigarettes    Last attempt to quit: 06/14/2016    Years since quitting: 1.2  . Smokeless tobacco: Never Used  Substance Use Topics  . 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  . Drug use: No    FAMILY HISTORY: Family History  Problem Relation Age of Onset  . Diabetes Father   . Obesity Father     ROS: Review of Systems  Constitutional: Negative for weight loss.  Cardiovascular: Negative for chest pain.  Gastrointestinal: Negative for nausea and vomiting.  Musculoskeletal:       Negative for muscle weakness  Psychiatric/Behavioral: Positive for depression. Negative for suicidal ideas.    PHYSICAL EXAM: Blood pressure 126/74, pulse 70, temperature 97.6 F (36.4 C), temperature source Oral, height 6' (1.829 m), weight (!) 411 lb (186.4 kg), SpO2 96 %. Body mass index is 55.74 kg/m. Physical Exam  Constitutional: He is oriented to person, place, and time. He appears well-developed and well-nourished.  Cardiovascular: Normal rate.  Pulmonary/Chest: Effort normal.  Musculoskeletal: Normal range of motion.  Neurological: He is oriented to person, place, and time.  Skin: Skin is warm and dry.  Psychiatric: He has a normal mood and affect. His behavior is normal.  Vitals reviewed.   RECENT LABS AND TESTS: BMET    Component Value Date/Time   NA 139 07/16/2017 0911   NA 144 03/08/2017 0932   K 4.3 07/16/2017 0911   CL 101 07/16/2017 0911   CO2 30 07/16/2017 0911   GLUCOSE 98 07/16/2017 0911   BUN 10 07/16/2017 0911   BUN 12 03/08/2017 0932   CREATININE 1.13 07/16/2017 0911   CREATININE 0.94 11/15/2015 0833   CALCIUM 9.0 07/16/2017 0911   GFRNONAA 94 03/08/2017 0932   GFRNONAA >89 11/15/2015 0833   GFRAA 108 03/08/2017 0932   GFRAA  >89 11/15/2015 0833   Lab Results  Component Value Date   HGBA1C 5.9 07/16/2017   HGBA1C 5.9 (H) 03/08/2017   HGBA1C 5.8 11/16/2015   Lab Results  Component Value Date   INSULIN 18.0 03/08/2017   CBC    Component Value Date/Time   WBC 8.6 03/08/2017 0932   WBC 8.8 09/14/2016 1140   RBC 4.63 03/08/2017 0932   RBC 4.89 09/14/2016 1140   HGB 13.2 03/08/2017 0932   HCT 41.5 03/08/2017 0932   PLT 285 03/08/2017 0932   MCV 90 03/08/2017 0932   MCH 28.5 03/08/2017 0932   MCH 27.8 09/14/2016 1140   MCHC 31.8 03/08/2017 0932   MCHC 31.8 09/14/2016 1140   RDW 14.7 03/08/2017 0932   LYMPHSABS 2.4 03/08/2017 0932   MONOABS 0.4  11/15/2015 0833   EOSABS 0.1 03/08/2017 0932   BASOSABS 0.0 03/08/2017 0932   Iron/TIBC/Ferritin/ %Sat No results found for: IRON, TIBC, FERRITIN, IRONPCTSAT Lipid Panel     Component Value Date/Time   CHOL 168 07/16/2017 0911   CHOL 157 03/08/2017 0932   TRIG 65.0 07/16/2017 0911   HDL 48.60 07/16/2017 0911   HDL 58 03/08/2017 0932   CHOLHDL 3 07/16/2017 0911   VLDL 13.0 07/16/2017 0911   LDLCALC 106 (H) 07/16/2017 0911   LDLCALC 85 03/08/2017 0932   Hepatic Function Panel     Component Value Date/Time   PROT 7.8 07/16/2017 0911   PROT 7.7 03/08/2017 0932   ALBUMIN 4.0 07/16/2017 0911   ALBUMIN 3.9 03/08/2017 0932   AST 18 07/16/2017 0911   ALT 24 07/16/2017 0911   ALKPHOS 84 07/16/2017 0911   BILITOT 0.3 07/16/2017 0911   BILITOT 0.3 03/08/2017 0932      Component Value Date/Time   TSH 1.260 03/08/2017 0932   TSH 1.07 11/15/2015 0833   Results for RHYDIAN, BALDI DAMON (MRN [id number]) as of 09/19/2017 07:53  Ref. Range 03/08/2017 09:32  Vitamin D, 25-Hydroxy Latest Ref Range: 30.0 - 100.0 ng/mL 5.2 (L)   ASSESSMENT AND PLAN: Vitamin D deficiency - Plan: Vitamin D, Ergocalciferol, (DRISDOL) 50000 units CAPS capsule  Essential hypertension - Plan: losartan (COZAAR) 100 MG tablet  Other depression - with emotional eating   At risk for  osteoporosis  Class 3 severe obesity with serious comorbidity and body mass index (BMI) of 50.0 to 59.9 in adult, unspecified obesity type (HCC) - Plan: Liraglutide -Weight Management (SAXENDA) 18 MG/3ML SOPN, Insulin Pen Needle (BD PEN NEEDLE NANO 2ND GEN) 32G X 4 MM MISC  PLAN:  Hypertension We discussed sodium restriction, working on healthy weight loss, and a regular exercise program as the means to achieve improved blood pressure control. Youcef agreed with this plan and agreed to follow up as directed. We will continue to monitor his blood pressure as well as his progress with the above lifestyle modifications. He agrees to continue losartan 100 mg qd #30 with no refills and will watch for signs of hypotension as he continues his lifestyle modifications.  Vitamin D Deficiency [place] was informed that low vitamin D levels contributes to fatigue and are associated with obesity, breast, and colon cancer. He agrees to continue to take prescription Vit D @50 ,000 IU every week #4 with no refills and will follow up for routine testing of vitamin D, at least 2-3 times per year. He was informed of the risk of over-replacement of vitamin D and agrees to not increase his dose unless he discusses this with [name] first. Brevan agrees to follow up as directed.  At risk for osteopenia and osteoporosis Jadarius was given extended  (15 minutes) osteoporosis prevention counseling today. Ramonte is at risk for osteopenia and osteoporosis due to his vitamin D deficiency. He was encouraged to take his vitamin D and follow his higher calcium diet and increase strengthening exercise to help strengthen his bones and decrease his risk of osteopenia and osteoporosis.  Depression with Emotional Eating Behaviors We discussed behavior modification techniques today to help Kymir deal with his emotional eating and depression. He has agreed to discontinue Wellbutrin SR 200 mg qd and follow up as directed.  Obesity Kru is currently  in the action stage of change. As such, his goal is to continue with weight loss efforts He has agreed to change to the Category 1 plan Denny Peon  has been instructed to work up to a goal of 150 minutes of combined cardio and strengthening exercise per week for weight loss and overall health benefits. We discussed the following Behavioral Modification Strategies today: planning for success and work on meal planning and easy cooking plans We discussed various medication options to help [name] with his weight loss efforts and we both agreed to start Saxenda 0.6 mg #1 and #30 needles.  [name] has agreed to follow up with our clinic in 2 weeks. He was informed of the importance of frequent follow up visits to maximize his success with intensive lifestyle modifications for his multiple health conditions.   OBESITY BEHAVIORAL INTERVENTION VISIT  Today's visit was # 10   Starting weight: 406 lbs Starting date: 03/08/17 Today's weight : 411 lbs  Today's date: 09/17/2017 Total lbs lost to date: 0   ASK: We discussed the diagnosis of obesity with Lonell GrandchildAvery Damon Mustapha today and Denny PeonAvery agreed to give us permission to discuss obesity behavioral modification therapy today.  ASSESS: [name] has the diagnosis of obesity and his BMI today is 55.97 [name] is in the action stage of change   ADVISE: [name] was educated on the multiple health risks of obesity as well as the benefit of weight loss to improve his health. He was advised of the need for long term treatment and the importance of lifestyle modifications to improve his current health and to decrease his risk of future health problems.  AGREE: Multiple dietary modification options and treatment options were discussed and  [name] agreed to follow the recommendations documented in the above note.  ARRANGE: [name] was educated on the importance of frequent visits to treat obesity as outlined per CMS and USPSTF guidelines and agreed to schedule his next follow up  appointment today.  Cristi LoronI, Joanne Murray, am acting as transcriptionist for Alois Clicheracey Jekhi Bolin, PA-C I, Alois Clicheracey Shahzaib Azevedo, PA-C have reviewed above note and agree with its content

## 2017-10-02 ENCOUNTER — Ambulatory Visit (INDEPENDENT_AMBULATORY_CARE_PROVIDER_SITE_OTHER): Payer: Managed Care, Other (non HMO) | Admitting: Physician Assistant

## 2017-10-02 ENCOUNTER — Encounter (INDEPENDENT_AMBULATORY_CARE_PROVIDER_SITE_OTHER): Payer: Self-pay | Admitting: Physician Assistant

## 2017-10-02 VITALS — BP 144/78 | HR 65 | Temp 98.2°F | Ht 72.0 in | Wt >= 6400 oz

## 2017-10-02 DIAGNOSIS — I10 Essential (primary) hypertension: Secondary | ICD-10-CM

## 2017-10-02 DIAGNOSIS — E559 Vitamin D deficiency, unspecified: Secondary | ICD-10-CM

## 2017-10-02 DIAGNOSIS — Z6841 Body Mass Index (BMI) 40.0 and over, adult: Secondary | ICD-10-CM

## 2017-10-02 DIAGNOSIS — Z9189 Other specified personal risk factors, not elsewhere classified: Secondary | ICD-10-CM

## 2017-10-02 MED ORDER — VITAMIN D (ERGOCALCIFEROL) 1.25 MG (50000 UNIT) PO CAPS: 50000.0000 [IU] | ORAL_CAPSULE | ORAL | 0 refills | Status: DC

## 2017-10-02 NOTE — Progress Notes (Signed)
Office: [phone]  /  Fax: [phone]   HPI:   Chief Complaint: OBESITY Louis [name] is here to discuss his progress with his obesity treatment plan. He is on the Category 4 plan and is following his eating plan approximately 90 % of the time. He states he is walking 2 miles on 2 times per week. Louis [name] did well with weight loss. He reports that there are times when he does not finish his lunch or dinner. He has had no adverse side effects with Saxenda.   His weight is (!) 410 lb (186 kg) today and has had a weight loss of 1 pound over a period of 2 weeks since his last visit. He has lost 0 lbs since starting treatment with [name].  Vitamin D deficiency Louis [name] has a diagnosis of vitamin D deficiency. He is currently taking vit D and denies nausea, vomiting or muscle weakness.  At risk for osteopenia and osteoporosis Louis [name] is at higher risk of osteopenia and osteoporosis due to vitamin D deficiency.   Hypertension Louis [name] Louis [name] is a 43 y.o. male with hypertension. His blood pressure was slightly elevated today. He is on losartan 100mg . He is working on weight loss to help control his blood pressure with the goal of decreasing his risk of heart attack and stroke. Louis [name] denies chest pain.  ALLERGIES: Allergies  Allergen Reactions  . No Known Allergies     MEDICATIONS: Current Outpatient Medications on File Prior to Visit  Medication Sig Dispense Refill  . acetaminophen (TYLENOL) 500 MG tablet Take 1,000 mg by mouth every 6 (six) hours as needed (for pain.).    Marland Kitchen diclofenac (VOLTAREN) 75 MG EC tablet TAKE 1 TABLET (75 MG TOTAL) BY MOUTH 2 (TWO) TIMES DAILY. 60 tablet 0  . Insulin Pen Needle (BD PEN NEEDLE NANO 2ND GEN) 32G X 4 MM MISC 1 Package by Does not apply route 2 (two) times daily. 100 each 0  . Liraglutide -Weight Management (SAXENDA) 18 MG/3ML SOPN Inject 0.3 mg into the skin daily at 12 noon. 5 pen 0  . losartan (COZAAR) 100 MG tablet Take 1 tablet (100 mg total) by mouth daily.  30 tablet 0  . metFORMIN (GLUCOPHAGE) 500 MG tablet Take 1 tablet (500 mg total) by mouth 2 (two) times daily with a meal. 60 tablet 0   No current facility-administered medications on file prior to visit.     PAST MEDICAL HISTORY: Past Medical History:  Diagnosis Date  . Allergy   . Arthritis    in both knees  . GERD (gastroesophageal reflux disease)   . Headache   . HTN (hypertension)   . Obesity     PAST SURGICAL HISTORY: Past Surgical History:  Procedure Laterality Date  . ACHILLES TENDON REPAIR    . ANTERIOR CRUCIATE LIGAMENT REPAIR    . LUMBAR LAMINECTOMY/DECOMPRESSION MICRODISCECTOMY Right 09/19/2016   Procedure: Right Lumbar Five-Sacral one Laminotomy/Foraminotomy/removal of Synovial cyst;  Surgeon: [name], MD;  Location: [facility] OR;  Service: Neurosurgery;  Laterality: Right;  Right L5-S1 Laminotomy/Foraminotomy/removal of Synovial cyst  . POSTERIOR CERVICAL LAMINECTOMY N/A 06/12/2016   Procedure: Cervical One Posterior cervical laminectomy;  Surgeon: [name], MD;  Location: [facility] OR;  Service: Neurosurgery;  Laterality: N/A;  posterior  . TONSILLECTOMY      SOCIAL HISTORY: Social History   Tobacco Use  . Smoking status: Former Smoker    Packs/day: 0.25    Years: 3.00    Pack years: 0.75    Types: Cigarettes  Last attempt to quit: 06/14/2016    Years since quitting: 1.3  . Smokeless tobacco: Never Used  Substance Use Topics  . 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  . Drug use: No    FAMILY HISTORY: Family History  Problem Relation Age of Onset  . Diabetes Father   . Obesity Father     ROS: Review of Systems  Constitutional: Positive for weight loss.  Cardiovascular: Negative for chest pain.  Gastrointestinal: Negative for nausea and vomiting.  Musculoskeletal:       Negative for muscle weakness.    PHYSICAL EXAM: Blood pressure (!) 144/78,  pulse 65, temperature 98.2 F (36.8 C), temperature source Oral, height 6' (1.829 m), weight (!) 410 lb (186 kg), SpO2 96 %. Body mass index is 55.61 kg/m. Physical Exam  Constitutional: He is oriented to person, place, and time. He appears well-developed and well-nourished.  Cardiovascular: Normal rate.  Pulmonary/Chest: Effort normal.  Musculoskeletal: Normal range of motion.  Neurological: He is oriented to person, place, and time.  Skin: Skin is warm and dry.  Psychiatric: He has a normal mood and affect. His behavior is normal.  Vitals reviewed.   RECENT LABS AND TESTS: BMET    Component Value Date/Time   NA 139 07/16/2017 0911   NA 144 03/08/2017 0932   K 4.3 07/16/2017 0911   CL 101 07/16/2017 0911   CO2 30 07/16/2017 0911   GLUCOSE 98 07/16/2017 0911   BUN 10 07/16/2017 0911   BUN 12 03/08/2017 0932   CREATININE 1.13 07/16/2017 0911   CREATININE 0.94 11/15/2015 0833   CALCIUM 9.0 07/16/2017 0911   GFRNONAA 94 03/08/2017 0932   GFRNONAA >89 11/15/2015 0833   GFRAA 108 03/08/2017 0932   GFRAA >89 11/15/2015 0833   Lab Results  Component Value Date   HGBA1C 5.9 07/16/2017   HGBA1C 5.9 (H) 03/08/2017   HGBA1C 5.8 11/16/2015   Lab Results  Component Value Date   INSULIN 18.0 03/08/2017   CBC    Component Value Date/Time   WBC 8.6 03/08/2017 0932   WBC 8.8 09/14/2016 1140   RBC 4.63 03/08/2017 0932   RBC 4.89 09/14/2016 1140   HGB 13.2 03/08/2017 0932   HCT 41.5 03/08/2017 0932   PLT 285 03/08/2017 0932   MCV 90 03/08/2017 0932   MCH 28.5 03/08/2017 0932   MCH 27.8 09/14/2016 1140   MCHC 31.8 03/08/2017 0932   MCHC 31.8 09/14/2016 1140   RDW 14.7 03/08/2017 0932   LYMPHSABS 2.4 03/08/2017 0932   MONOABS 0.4 11/15/2015 0833   EOSABS 0.1 03/08/2017 0932   BASOSABS 0.0 03/08/2017 0932   Iron/TIBC/Ferritin/ %Sat No results found for: IRON, TIBC, FERRITIN, IRONPCTSAT Lipid Panel     Component Value Date/Time   CHOL 168 07/16/2017 0911   CHOL 157  03/08/2017 0932   TRIG 65.0 07/16/2017 0911   HDL 48.60 07/16/2017 0911   HDL 58 03/08/2017 0932   CHOLHDL 3 07/16/2017 0911   VLDL 13.0 07/16/2017 0911   LDLCALC 106 (H) 07/16/2017 0911   LDLCALC 85 03/08/2017 0932   Hepatic Function Panel     Component Value Date/Time   PROT 7.8 07/16/2017 0911   PROT 7.7 03/08/2017 0932   ALBUMIN 4.0 07/16/2017 0911   ALBUMIN 3.9 03/08/2017 0932   AST 18 07/16/2017 0911   ALT 24 07/16/2017  0911   ALKPHOS 84 07/16/2017 0911   BILITOT 0.3 07/16/2017 0911   BILITOT 0.3 03/08/2017 0932      Component Value Date/Time   TSH 1.260 03/08/2017 0932   TSH 1.07 11/15/2015 0833   Results for LANI, MENDIOLA DAMON (MRN [id number]) as of 10/02/2017 10:16  Ref. Range 03/08/2017 09:32  Vitamin D, 25-Hydroxy Latest Ref Range: 30.0 - 100.0 ng/mL 5.2 (L)    ASSESSMENT AND PLAN: Vitamin D deficiency - Plan: Vitamin D, Ergocalciferol, (DRISDOL) 50000 units CAPS capsule  Essential hypertension  At risk for osteoporosis  Class 3 severe obesity with serious comorbidity and body mass index (BMI) of 50.0 to 59.9 in adult, unspecified obesity type (HCC)  PLAN:  Vitamin D Deficiency Louis [name] was informed that low vitamin D levels contributes to fatigue and are associated with obesity, breast, and colon cancer. He agrees to continue to take prescription Vit D @50 ,000 IU every week #4 with no refills and will follow up for routine testing of vitamin D, at least 2-3 times per year. He was informed of the risk of over-replacement of vitamin D and agrees to not increase his dose unless he discusses this with [name] first. Louis [name] agrees to follow up in 2 weeks. Louis [name] agreed with this plan and agreed to follow up as directed in 2 weeks.  At risk for osteopenia and osteoporosis Louis [name] was given extended (15 minutes) osteoporosis prevention counseling today. Louis [name] is at risk for osteopenia and osteoporosis due to his vitamin D deficiency. He was encouraged to take his vitamin D and  follow his higher calcium diet and increase strengthening exercise to help strengthen his bones and decrease his risk of osteopenia and osteoporosis.  Hypertension We discussed sodium restriction, working on healthy weight loss, and a regular exercise program as the means to achieve improved blood pressure control. We will continue to monitor his blood pressure as well as his progress with the above lifestyle modifications. He will continue his medications as prescribed, diet, and weight loss and will watch for signs of hypotension as he continues his lifestyle modifications.  Obesity Louis [name] is currently in the action stage of change. As such, his goal is to continue with weight loss efforts. He has agreed to follow the Category 4 plan. Louis [name] has been instructed to work up to a goal of 150 minutes of combined cardio and strengthening exercise per week for weight loss and overall health benefits. We discussed the following Behavioral Modification Strategies today: increasing lean protein intake and no skipping meals. We discussed medication options to help Jeanluc with his weight loss efforts and we both agreed to increase Saxenda to 1.2mg  with no refills needed.  Louis [name] has agreed to follow up with our clinic in 2 weeks. He was informed of the importance of frequent follow up visits to maximize his success with intensive lifestyle modifications for his multiple health conditions.   OBESITY BEHAVIORAL INTERVENTION VISIT  Today's visit was # 11   Starting weight: 406 lbs Starting date: 03/08/17 Today's weight : Weight: (!) 410 lb (186 kg)  Today's date: 10/02/2017 Total lbs lost to date: 0  ASK: We discussed the diagnosis of obesity with Louis [name] today and Louis [name] agreed to give [name] permission to discuss obesity behavioral modification therapy today.  ASSESS: Louis [name] has the diagnosis of obesity and his BMI today is 55.59. Louis [name] is in the action stage of change.   ADVISE: Louis [name] was  educated on the multiple health risks of obesity as well as  the benefit of weight loss to improve his health. He was advised of the need for long term treatment and the importance of lifestyle modifications to improve his current health and to decrease his risk of future health problems.  AGREE: Multiple dietary modification options and treatment options were discussed and Louis [name] agreed to follow the recommendations documented in the above note.  ARRANGE: Louis [name] was educated on the importance of frequent visits to treat obesity as outlined per CMS and USPSTF guidelines and agreed to schedule his next follow up appointment today.  Launa Flight, am acting as transcriptionist for Alois Cliche, PA-C I, Alois Cliche, PA-C have reviewed above note and agree with its content

## 2017-10-11 ENCOUNTER — Other Ambulatory Visit: Payer: Self-pay | Admitting: Medical

## 2017-10-11 DIAGNOSIS — I10 Essential (primary) hypertension: Secondary | ICD-10-CM

## 2017-10-11 NOTE — Telephone Encounter (Signed)
Losartan last refilled 09/17/17, 0RF, last refilled by Alois Cliche. Routed to PCP to review.

## 2017-10-11 NOTE — Telephone Encounter (Signed)
Rx losartan sent to pt pharmacy. 

## 2017-10-16 ENCOUNTER — Encounter (INDEPENDENT_AMBULATORY_CARE_PROVIDER_SITE_OTHER): Payer: Self-pay | Admitting: Physician Assistant

## 2017-10-16 ENCOUNTER — Ambulatory Visit (INDEPENDENT_AMBULATORY_CARE_PROVIDER_SITE_OTHER): Payer: Managed Care, Other (non HMO) | Admitting: Physician Assistant

## 2017-10-16 VITALS — BP 138/73 | HR 78 | Temp 98.3°F | Ht 72.0 in | Wt >= 6400 oz

## 2017-10-16 DIAGNOSIS — Z6841 Body Mass Index (BMI) 40.0 and over, adult: Secondary | ICD-10-CM

## 2017-10-16 DIAGNOSIS — E559 Vitamin D deficiency, unspecified: Secondary | ICD-10-CM | POA: Diagnosis not present

## 2017-10-17 NOTE — Progress Notes (Signed)
Office: [phone]  /  Fax: [phone]   HPI:   Chief Complaint: OBESITY Louis [name] is here to discuss his progress with his obesity treatment plan. He is on the Category 4 plan and is following his eating plan approximately 85 % of the time. He states he is walking for 30-45 minutes 7 times per week. Louis [name] did well with weight loss. He reports following the plan closely. He notes a decrease in his appetite with increase dose of Saxenda.  His weight is (!) 409 lb (185.5 kg) today and has had a weight loss of 1 pound over a period of 2 weeks since his last visit. He has lost 0 lbs since starting treatment with [name].  Vitamin D Deficiency Louis [name] has a diagnosis of vitamin D deficiency. He is currently taking prescription Vit D and denies nausea, vomiting or muscle weakness.  ALLERGIES: Allergies  Allergen Reactions  . No Known Allergies     MEDICATIONS: Current Outpatient Medications on File Prior to Visit  Medication Sig Dispense Refill  . acetaminophen (TYLENOL) 500 MG tablet Take 1,000 mg by mouth every 6 (six) hours as needed (for pain.).    Marland Kitchen diclofenac (VOLTAREN) 75 MG EC tablet TAKE 1 TABLET (75 MG TOTAL) BY MOUTH 2 (TWO) TIMES DAILY. 60 tablet 0  . Insulin Pen Needle (BD PEN NEEDLE NANO 2ND GEN) 32G X 4 MM MISC 1 Package by Does not apply route 2 (two) times daily. 100 each 0  . Liraglutide -Weight Management (SAXENDA) 18 MG/3ML SOPN Inject 0.3 mg into the skin daily at 12 noon. 5 pen 0  . losartan (COZAAR) 100 MG tablet Take 1 tablet (100 mg total) by mouth daily. 30 tablet 0  . losartan (COZAAR) 100 MG tablet TAKE 1 TABLET BY MOUTH EVERY DAY 30 tablet 2  . metFORMIN (GLUCOPHAGE) 500 MG tablet Take 1 tablet (500 mg total) by mouth 2 (two) times daily with a meal. 60 tablet 0  . Vitamin D, Ergocalciferol, (DRISDOL) 50000 units CAPS capsule Take 1 capsule (50,000 Units total) by mouth every 7 (seven) days. 4 capsule 0   No current facility-administered medications on file prior to  visit.     PAST MEDICAL HISTORY: Past Medical History:  Diagnosis Date  . Allergy   . Arthritis    in both knees  . GERD (gastroesophageal reflux disease)   . Headache   . HTN (hypertension)   . Obesity     PAST SURGICAL HISTORY: Past Surgical History:  Procedure Laterality Date  . ACHILLES TENDON REPAIR    . ANTERIOR CRUCIATE LIGAMENT REPAIR    . LUMBAR LAMINECTOMY/DECOMPRESSION MICRODISCECTOMY Right 09/19/2016   Procedure: Right Lumbar Five-Sacral one Laminotomy/Foraminotomy/removal of Synovial cyst;  Surgeon: [name], MD;  Location: [facility] OR;  Service: Neurosurgery;  Laterality: Right;  Right L5-S1 Laminotomy/Foraminotomy/removal of Synovial cyst  . POSTERIOR CERVICAL LAMINECTOMY N/A 06/12/2016   Procedure: Cervical One Posterior cervical laminectomy;  Surgeon: [name], MD;  Location: [facility] OR;  Service: Neurosurgery;  Laterality: N/A;  posterior  . TONSILLECTOMY      SOCIAL HISTORY: Social History   Tobacco Use  . Smoking status: Former Smoker    Packs/day: 0.25    Years: 3.00    Pack years: 0.75    Types: Cigarettes    Last attempt to quit: 06/14/2016    Years since quitting: 1.3  . Smokeless tobacco: Never Used  Substance Use Topics  . 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  . Drug use: No    FAMILY HISTORY: Family History  Problem Relation Age of Onset  . Diabetes Father   . Obesity Father     ROS: Review of Systems  Constitutional: Positive for weight loss.  Gastrointestinal: Negative for nausea and vomiting.  Musculoskeletal:       Negative muscle weakness    PHYSICAL EXAM: Blood pressure 138/73, pulse 78, temperature 98.3 F (36.8 C), temperature source Oral, height 6' (1.829 m), weight (!) 409 lb (185.5 kg), SpO2 97 %. Body mass index is 55.47 kg/m. Physical Exam  Constitutional: He is oriented to person, place, and time. He appears  well-developed and well-nourished.  Cardiovascular: Normal rate.  Pulmonary/Chest: Effort normal.  Musculoskeletal: Normal range of motion.  Neurological: He is oriented to person, place, and time.  Skin: Skin is warm and dry.  Psychiatric: He has a normal mood and affect. His behavior is normal.  Vitals reviewed.   RECENT LABS AND TESTS: BMET    Component Value Date/Time   NA 139 07/16/2017 0911   NA 144 03/08/2017 0932   K 4.3 07/16/2017 0911   CL 101 07/16/2017 0911   CO2 30 07/16/2017 0911   GLUCOSE 98 07/16/2017 0911   BUN 10 07/16/2017 0911   BUN 12 03/08/2017 0932   CREATININE 1.13 07/16/2017 0911   CREATININE 0.94 11/15/2015 0833   CALCIUM 9.0 07/16/2017 0911   GFRNONAA 94 03/08/2017 0932   GFRNONAA >89 11/15/2015 0833   GFRAA 108 03/08/2017 0932   GFRAA >89 11/15/2015 0833   Lab Results  Component Value Date   HGBA1C 5.9 07/16/2017   HGBA1C 5.9 (H) 03/08/2017   HGBA1C 5.8 11/16/2015   Lab Results  Component Value Date   INSULIN 18.0 03/08/2017   CBC    Component Value Date/Time   WBC 8.6 03/08/2017 0932   WBC 8.8 09/14/2016 1140   RBC 4.63 03/08/2017 0932   RBC 4.89 09/14/2016 1140   HGB 13.2 03/08/2017 0932   HCT 41.5 03/08/2017 0932   PLT 285 03/08/2017 0932   MCV 90 03/08/2017 0932   MCH 28.5 03/08/2017 0932   MCH 27.8 09/14/2016 1140   MCHC 31.8 03/08/2017 0932   MCHC 31.8 09/14/2016 1140   RDW 14.7 03/08/2017 0932   LYMPHSABS 2.4 03/08/2017 0932   MONOABS 0.4 11/15/2015 0833   EOSABS 0.1 03/08/2017 0932   BASOSABS 0.0 03/08/2017 0932   Iron/TIBC/Ferritin/ %Sat No results found for: IRON, TIBC, FERRITIN, IRONPCTSAT Lipid Panel     Component Value Date/Time   CHOL 168 07/16/2017 0911   CHOL 157 03/08/2017 0932   TRIG 65.0 07/16/2017 0911   HDL 48.60 07/16/2017 0911   HDL 58 03/08/2017 0932   CHOLHDL 3 07/16/2017 0911   VLDL 13.0 07/16/2017 0911   LDLCALC 106 (H) 07/16/2017 0911   LDLCALC 85 03/08/2017 0932   Hepatic Function  Panel     Component Value Date/Time   PROT 7.8 07/16/2017 0911   PROT 7.7 03/08/2017 0932   ALBUMIN 4.0 07/16/2017 0911   ALBUMIN 3.9 03/08/2017 0932   AST 18 07/16/2017 0911   ALT 24 07/16/2017 0911   ALKPHOS 84 07/16/2017 0911   BILITOT 0.3 07/16/2017 0911   BILITOT 0.3 03/08/2017 0932      Component Value Date/Time   TSH 1.260 03/08/2017 0932   TSH 1.07 11/15/2015 0833  Results for Gal, Dmarcus DAMON (  MRN [id number]) as of 10/17/2017 15:45  Ref. Range 03/08/2017 09:32  Vitamin D, 25-Hydroxy Latest Ref Range: 30.0 - 100.0 ng/mL 5.2 (L)    ASSESSMENT AND PLAN: Vitamin D deficiency  Class 3 severe obesity with serious comorbidity and body mass index (BMI) of 50.0 to 59.9 in adult, unspecified obesity type (HCC)  PLAN:  Vitamin D Deficiency Justice was informed that low vitamin D levels contributes to fatigue and are associated with obesity, breast, and colon cancer. Yousof agrees to continue taking prescription Vit D @50 ,000 IU every week and will follow up for routine testing of vitamin D, at least 2-3 times per year. He was informed of the risk of over-replacement of vitamin D and agrees to not increase his dose unless he discusses this with [name] first. Jamont agrees to follow up with our clinic in 2 weeks.  I spent > than 50% of the 15 minute visit on counseling as documented in the note.  Obesity Xian is currently in the action stage of change. As such, his goal is to continue with weight loss efforts He has agreed to follow the Category 4 plan Colbin has been instructed to work up to a goal of 150 minutes of combined cardio and strengthening exercise per week for weight loss and overall health benefits. We discussed the following Behavioral Modification Strategies today: work on meal planning and easy cooking plans and planning for success Recheck IC next visit.  Blu has agreed to follow up with our clinic in 2 weeks. He was informed of the importance of frequent follow up  visits to maximize his success with intensive lifestyle modifications for his multiple health conditions.   OBESITY BEHAVIORAL INTERVENTION VISIT  Today's visit was # 12   Starting weight: 406 lbs Starting date: 03/08/17 Today's weight : 409 lbs Today's date: 10/16/2017 Total lbs lost to date: 0    ASK: We discussed the diagnosis of obesity with Louis [name] today and Louis [name] agreed to give [name] permission to discuss obesity behavioral modification therapy today.  ASSESS: Louis [name] has the diagnosis of obesity and his BMI today is 55.46 Louis [name] is in the action stage of change   ADVISE: Nori was educated on the multiple health risks of obesity as well as the benefit of weight loss to improve his health. He was advised of the need for long term treatment and the importance of lifestyle modifications.  AGREE: Multiple dietary modification options and treatment options were discussed and  Louis [name] agreed to the above obesity treatment plan.  Trude Mcburney, am acting as transcriptionist for Alois Cliche, PA-C I, Alois Cliche, PA-C have reviewed above note and agree with its content

## 2017-10-18 ENCOUNTER — Other Ambulatory Visit: Payer: Self-pay | Admitting: Medical

## 2017-10-30 ENCOUNTER — Ambulatory Visit (INDEPENDENT_AMBULATORY_CARE_PROVIDER_SITE_OTHER): Payer: Managed Care, Other (non HMO) | Admitting: Physician Assistant

## 2017-10-30 VITALS — BP 125/73 | HR 64 | Temp 98.0°F | Ht 72.0 in | Wt >= 6400 oz

## 2017-10-30 DIAGNOSIS — Z6841 Body Mass Index (BMI) 40.0 and over, adult: Secondary | ICD-10-CM

## 2017-10-30 DIAGNOSIS — E7849 Other hyperlipidemia: Secondary | ICD-10-CM | POA: Diagnosis not present

## 2017-10-30 DIAGNOSIS — E559 Vitamin D deficiency, unspecified: Secondary | ICD-10-CM

## 2017-10-30 DIAGNOSIS — Z9189 Other specified personal risk factors, not elsewhere classified: Secondary | ICD-10-CM | POA: Diagnosis not present

## 2017-10-30 DIAGNOSIS — R7303 Prediabetes: Secondary | ICD-10-CM

## 2017-10-30 MED ORDER — METFORMIN HCL 500 MG PO TABS: 500.0000 mg | ORAL_TABLET | Freq: Every day | ORAL | 0 refills | Status: DC

## 2017-10-30 MED ORDER — VITAMIN D (ERGOCALCIFEROL) 1.25 MG (50000 UNIT) PO CAPS: 50000.0000 [IU] | ORAL_CAPSULE | ORAL | 0 refills | Status: DC

## 2017-10-30 NOTE — Progress Notes (Signed)
Office: [phone]  /  Fax: [phone]   HPI:   Chief Complaint: OBESITY Louis [name] [name] here to discuss his progress with his obesity treatment plan. He [name] on the Category 4 plan and [name] following his eating plan approximately 85 % of the time. He states he [name] walking and using resistance bands 20 minutes 3 to 5 times per week. Louis [name] did very well with weight loss. He reports that the Saxenda has decreased his appetite and he [name] eating although he [name] not hungry.  His weight [name] (!) 404 lb (183.3 kg) today and has had a weight loss of 5 pounds over a period of 2 weeks since his last visit. He has lost 2 lbs since starting treatment with Louis [name].  Pre-Diabetes Louis [name] has a diagnosis of pre-diabetes based on his elevated Hgb A1c and was informed this puts him at greater risk of developing diabetes. He [name] taking metformin currently and continues to work on diet and exercise to decrease risk of diabetes. He denies nausea, vomiting, diarrhea, or polyphagia.  At risk for diabetes Louis [name] [name] at higher than average risk for developing diabetes due to his pre-diabetes and obesity.   Vitamin D deficiency Louis [name] has a diagnosis of vitamin D deficiency. He [name] currently taking vit D and denies nausea, vomiting or muscle weakness.  Hyperlipidemia Louis [name] has hyperlipidemia and has been trying to improve his cholesterol levels with intensive lifestyle modification including a low saturated fat diet, exercise and weight loss. His last labs were not at goal. He denies any chest pain.  ALLERGIES: Allergies  Allergen Reactions  . No Known Allergies     MEDICATIONS: Current Outpatient Medications on File Prior to Visit  Medication Sig Dispense Refill  . acetaminophen (TYLENOL) 500 MG tablet Take 1,000 mg by mouth every 6 (six) hours as needed (for pain.).    Marland Kitchen diclofenac (VOLTAREN) 75 MG EC tablet TAKE 1 TABLET (75 MG TOTAL) BY MOUTH 2 (TWO) TIMES DAILY. 60 tablet 0  . Insulin Pen Needle (BD PEN NEEDLE NANO 2ND  GEN) 32G X 4 MM MISC 1 Package by Does not apply route 2 (two) times daily. 100 each 0  . Liraglutide -Weight Management (SAXENDA) 18 MG/3ML SOPN Inject 0.3 mg into the skin daily at 12 noon. 5 pen 0  . losartan (COZAAR) 100 MG tablet TAKE 1 TABLET BY MOUTH EVERY DAY 30 tablet 2   No current facility-administered medications on file prior to visit.     PAST MEDICAL HISTORY: Past Medical History:  Diagnosis Date  . Allergy   . Arthritis    in both knees  . GERD (gastroesophageal reflux disease)   . Headache   . HTN (hypertension)   . Obesity     PAST SURGICAL HISTORY: Past Surgical History:  Procedure Laterality Date  . ACHILLES TENDON REPAIR    . ANTERIOR CRUCIATE LIGAMENT REPAIR    . LUMBAR LAMINECTOMY/DECOMPRESSION MICRODISCECTOMY Right 09/19/2016   Procedure: Right Lumbar Five-Sacral one Laminotomy/Foraminotomy/removal of Synovial cyst;  Surgeon: [name], MD;  Location: [facility] OR;  Service: Neurosurgery;  Laterality: Right;  Right L5-S1 Laminotomy/Foraminotomy/removal of Synovial cyst  . POSTERIOR CERVICAL LAMINECTOMY N/A 06/12/2016   Procedure: Cervical One Posterior cervical laminectomy;  Surgeon: [name], MD;  Location: [facility] OR;  Service: Neurosurgery;  Laterality: N/A;  posterior  . TONSILLECTOMY      SOCIAL HISTORY: Social History   Tobacco Use  . Smoking status: Former Smoker    Packs/day: 0.25    Years: 3.00  Pack years: 0.75    Types: Cigarettes    Last attempt to quit: 06/14/2016    Years since quitting: 1.3  . Smokeless tobacco: Never Used  Substance Use Topics  . 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  . Drug use: No    FAMILY HISTORY: Family History  Problem Relation Age of Onset  . Diabetes Father   . Obesity Father     ROS: Review of Systems  Constitutional: Positive for weight loss.  Cardiovascular: Negative for chest pain.    Gastrointestinal: Negative for diarrhea, nausea and vomiting.  Musculoskeletal:       Negative for muscle weakness.  Endo/Heme/Allergies:       Negative for polyphagia.    PHYSICAL EXAM: Blood pressure 125/73, pulse 64, temperature 98 F (36.7 C), temperature source Oral, height 6' (1.829 m), weight (!) 404 lb (183.3 kg), SpO2 96 %. Body mass index [name] 54.79 kg/m. Physical Exam  Constitutional: He [name] oriented to person, place, and time. He appears well-developed and well-nourished.  Cardiovascular: Normal rate.  Pulmonary/Chest: Effort normal.  Musculoskeletal: Normal range of motion.  Neurological: He [name] oriented to person, place, and time.  Skin: Skin [name] warm and dry.  Psychiatric: He has a normal mood and affect. His behavior [name] normal.  Vitals reviewed.   RECENT LABS AND TESTS: BMET    Component Value Date/Time   NA 139 07/16/2017 0911   NA 144 03/08/2017 0932   K 4.3 07/16/2017 0911   CL 101 07/16/2017 0911   CO2 30 07/16/2017 0911   GLUCOSE 98 07/16/2017 0911   BUN 10 07/16/2017 0911   BUN 12 03/08/2017 0932   CREATININE 1.13 07/16/2017 0911   CREATININE 0.94 11/15/2015 0833   CALCIUM 9.0 07/16/2017 0911   GFRNONAA 94 03/08/2017 0932   GFRNONAA >89 11/15/2015 0833   GFRAA 108 03/08/2017 0932   GFRAA >89 11/15/2015 0833   Lab Results  Component Value Date   HGBA1C 5.9 07/16/2017   HGBA1C 5.9 (H) 03/08/2017   HGBA1C 5.8 11/16/2015   Lab Results  Component Value Date   INSULIN 18.0 03/08/2017   CBC    Component Value Date/Time   WBC 8.6 03/08/2017 0932   WBC 8.8 09/14/2016 1140   RBC 4.63 03/08/2017 0932   RBC 4.89 09/14/2016 1140   HGB 13.2 03/08/2017 0932   HCT 41.5 03/08/2017 0932   PLT 285 03/08/2017 0932   MCV 90 03/08/2017 0932   MCH 28.5 03/08/2017 0932   MCH 27.8 09/14/2016 1140   MCHC 31.8 03/08/2017 0932   MCHC 31.8 09/14/2016 1140   RDW 14.7 03/08/2017 0932   LYMPHSABS 2.4 03/08/2017 0932   MONOABS 0.4 11/15/2015 0833   EOSABS 0.1  03/08/2017 0932   BASOSABS 0.0 03/08/2017 0932   Iron/TIBC/Ferritin/ %Sat No results found for: IRON, TIBC, FERRITIN, IRONPCTSAT Lipid Panel     Component Value Date/Time   CHOL 168 07/16/2017 0911   CHOL 157 03/08/2017 0932   TRIG 65.0 07/16/2017 0911   HDL 48.60 07/16/2017 0911   HDL 58 03/08/2017 0932   CHOLHDL 3 07/16/2017 0911   VLDL 13.0 07/16/2017 0911   LDLCALC 106 (H) 07/16/2017 0911   LDLCALC 85 03/08/2017 0932   Hepatic Function Panel     Component Value Date/Time   PROT 7.8 07/16/2017 0911   PROT 7.7 03/08/2017 0932  ALBUMIN 4.0 07/16/2017 0911   ALBUMIN 3.9 03/08/2017 0932   AST 18 07/16/2017 0911   ALT 24 07/16/2017 0911   ALKPHOS 84 07/16/2017 0911   BILITOT 0.3 07/16/2017 0911   BILITOT 0.3 03/08/2017 0932      Component Value Date/Time   TSH 1.260 03/08/2017 0932   TSH 1.07 11/15/2015 0833   Results for KONSTANTINE, GERVASI DAMON (MRN [id number]) as of 10/30/2017 17:14  Ref. Range 03/08/2017 09:32  Vitamin D, 25-Hydroxy Latest Ref Range: 30.0 - 100.0 ng/mL 5.2 (L)   ASSESSMENT AND PLAN: Prediabetes - Plan: Hemoglobin A1c, Insulin, random, Comprehensive metabolic panel, metFORMIN (GLUCOPHAGE) 500 MG tablet  Vitamin D deficiency - Plan: VITAMIN D 25 Hydroxy (Vit-D Deficiency, Fractures), Vitamin D, Ergocalciferol, (DRISDOL) 50000 units CAPS capsule  Other hyperlipidemia - Plan: Lipid Panel With LDL/HDL Ratio  At risk for diabetes mellitus  Class 3 severe obesity with serious comorbidity and body mass index (BMI) of 50.0 to 59.9 in adult, unspecified obesity type (HCC)  PLAN:  Pre-Diabetes Louis [name] will continue to work on weight loss, exercise, and decreasing simple carbohydrates in his diet to help decrease the risk of diabetes. He was informed that eating too many simple carbohydrates or too many calories at one sitting increases the likelihood of GI side effects. Louis [name] [name] to continue metformin 500mg , PO BID #60 with no refills and a prescription was  written today. We will check labs today and Louis [name] to follow up with Louis [name] as directed to monitor his progress in 2 weeks.  Diabetes risk counseling Louis [name] was given extended (15 minutes) diabetes prevention counseling today. He [name] 42 y.o. male and has risk factors for diabetes including pre-diabetes and obesity. We discussed intensive lifestyle modifications today with an emphasis on weight loss as well as increasing exercise and decreasing simple carbohydrates in his diet.  Vitamin D Deficiency Louis [name] was informed that low vitamin D levels contributes to fatigue and are associated with obesity, breast, and colon cancer. He [name] to continue to take prescription Vit D @50 ,000 IU every week #4 with no refills and will follow up for routine testing of vitamin D, at least 2-3 times per year. He was informed of the risk of over-replacement of vitamin D and [name] to not increase his dose unless he discusses this with Louis [name] first. We  will check labs today. Louis [name] to follow up at the [name] upon time in 2 weeks.  Hyperlipidemia Louis [name] was informed of the American Heart Association Guidelines emphasizing intensive lifestyle modifications as the first line treatment for hyperlipidemia. We discussed many lifestyle modifications today in depth, and Louis [name] will continue to work on decreasing saturated fats such as fatty red meat, butter and many fried foods. He will also increase vegetables and lean protein in his diet and continue to work on exercise and weight loss efforts. We will check labwork today. Louis [name] [name] to continue with his diet, exercise, and weight loss.  Obesity Louis [name] currently in the action stage of change. As such, his goal [name] to continue with weight loss efforts. His IC shows a decrease in his metabolism. He has [name] to change to the modified Category 3 plan with modified breakfast and modified for no cheese and added meat. Louis [name] has been instructed to work up to a goal of 150  minutes of combined cardio and strengthening exercise per week for weight loss and overall health benefits. We discussed the following Behavioral Modification Strategies today: work on meal planning and easy cooking plans  and planning for success.  Louis [name] has [name] to follow up with our clinic in 2 weeks. He was informed of the importance of frequent follow up visits to maximize his success with intensive lifestyle modifications for his multiple health conditions.   OBESITY BEHAVIORAL INTERVENTION VISIT  Today's visit was # 13   Starting weight: 406 lbs Starting date: 03/08/17 Today's weight : Weight: (!) 404 lb (183.3 kg)  Today's date: 10/30/2017 Total lbs lost to date: 2  ASK: We discussed the diagnosis of obesity with Louis [name] today and Louis [name] [name] to give Louis [name] permission to discuss obesity behavioral modification therapy today.  ASSESS: Louis [name] has the diagnosis of obesity and his BMI today [name] 54.78. Louis [name] [name] in the action stage of change.   ADVISE: Louis [name] was educated on the multiple health risks of obesity as well as the benefit of weight loss to improve his health. He was advised of the need for long term treatment and the importance of lifestyle modifications to improve his current health and to decrease his risk of future health problems.  AGREE: Multiple dietary modification options and treatment options were discussed and Louis [name] [name] to follow the recommendations documented in the above note.  ARRANGE: Rynell was educated on the importance of frequent visits to treat obesity as outlined per CMS and USPSTF guidelines and [name] to schedule his next follow up appointment today.  Launa Flight, am acting as transcriptionist for Alois Cliche, PA-C I, Alois Cliche, PA-C have reviewed above note and agree with its content

## 2017-10-31 LAB — VITAMIN D 25 HYDROXY (VIT D DEFICIENCY, FRACTURES): VIT D 25 HYDROXY: 33.9 ng/mL (ref 30.0–100.0)

## 2017-10-31 LAB — INSULIN, RANDOM: INSULIN: 15.6 u[IU]/mL (ref 2.6–24.9)

## 2017-10-31 LAB — COMPREHENSIVE METABOLIC PANEL
ALK PHOS: 90 IU/L (ref 39–117)
ALT: 22 IU/L (ref 0–44)
AST: 21 IU/L (ref 0–40)
Albumin/Globulin Ratio: 1.2 (ref 1.2–2.2)
Albumin: 4.1 g/dL (ref 3.5–5.5)
BUN/Creatinine Ratio: 9 (ref 9–20)
BUN: 9 mg/dL (ref 6–24)
Bilirubin Total: 0.3 mg/dL (ref 0.0–1.2)
CALCIUM: 9 mg/dL (ref 8.7–10.2)
CO2: 26 mmol/L (ref 20–29)
CREATININE: 0.98 mg/dL (ref 0.76–1.27)
Chloride: 101 mmol/L (ref 96–106)
GFR calc Af Amer: 109 mL/min/{1.73_m2} (ref >59)
GFR, EST NON AFRICAN AMERICAN: 95 mL/min/{1.73_m2} (ref >59)
GLUCOSE: 90 mg/dL (ref 65–99)
Globulin, Total: 3.5 g/dL (ref 1.5–4.5)
Potassium: 4.3 mmol/L (ref 3.5–5.2)
Sodium: 141 mmol/L (ref 134–144)
Total Protein: 7.6 g/dL (ref 6.0–8.5)

## 2017-10-31 LAB — LIPID PANEL WITH LDL/HDL RATIO
Cholesterol, Total: 140 mg/dL (ref 100–199)
HDL: 43 mg/dL (ref >39)
LDL Calculated: 86 mg/dL (ref 0–99)
LDL/HDL RATIO: 2 ratio (ref 0.0–3.6)
Triglycerides: 54 mg/dL (ref 0–149)
VLDL Cholesterol Cal: 11 mg/dL (ref 5–40)

## 2017-10-31 LAB — HEMOGLOBIN A1C
Est. average glucose Bld gHb Est-mCnc: 117 mg/dL
HEMOGLOBIN A1C: 5.7 % — AB (ref 4.8–5.6)

## 2017-11-05 ENCOUNTER — Other Ambulatory Visit: Payer: Self-pay | Admitting: Medical

## 2017-11-05 DIAGNOSIS — I10 Essential (primary) hypertension: Secondary | ICD-10-CM

## 2017-11-13 ENCOUNTER — Ambulatory Visit (INDEPENDENT_AMBULATORY_CARE_PROVIDER_SITE_OTHER): Payer: Managed Care, Other (non HMO) | Admitting: Physician Assistant

## 2017-11-13 VITALS — BP 145/63 | HR 78 | Temp 98.4°F | Ht 72.0 in | Wt >= 6400 oz

## 2017-11-13 DIAGNOSIS — Z9189 Other specified personal risk factors, not elsewhere classified: Secondary | ICD-10-CM

## 2017-11-13 DIAGNOSIS — R7303 Prediabetes: Secondary | ICD-10-CM

## 2017-11-13 DIAGNOSIS — E559 Vitamin D deficiency, unspecified: Secondary | ICD-10-CM | POA: Diagnosis not present

## 2017-11-13 DIAGNOSIS — Z6841 Body Mass Index (BMI) 40.0 and over, adult: Secondary | ICD-10-CM

## 2017-11-13 MED ORDER — METFORMIN HCL 500 MG PO TABS: 500.0000 mg | ORAL_TABLET | Freq: Every day | ORAL | 0 refills | Status: DC

## 2017-11-14 NOTE — Progress Notes (Signed)
Office: [phone]  /  Fax: [phone]   HPI:   Chief Complaint: OBESITY Louis [name] is here to discuss his progress with his obesity treatment plan. He is on the Category 3 plan modified for breakfast and is following his eating plan approximately 95 % of the time. He states he is walking 4200 steps 7 times per week. Louis [name] reports that he was off track for the first week, but he is now back on track. Louis [name] is on Saxenda, which he states decreased his appetite initially. He reports that he does not have much of an appetite in general but makes himself eat all of the food on the plan.  His weight is (!) 406 lb (184.2 kg) today and has had a weight gain of 2 pounds over a period of 2 weeks since his last visit. He has lost 0 lbs since starting treatment with [name].  Pre-Diabetes Louis [name] has a diagnosis of prediabetes. His most recent Hgb A1c was at 5.7 and was informed this puts him at greater risk of developing diabetes. [name] is taking metformin currently and he denies nausea, vomiting or diarrhea. He continues to work on diet and exercise to decrease risk of diabetes. He denies polyphagia or hypoglycemia.  At risk for diabetes Louis [name] is at higher than average risk for developing diabetes due to his obesity and prediabetes. He currently denies polyuria or polydipsia.  Vitamin D deficiency Louis [name] has a diagnosis of vitamin D deficiency. Louis [name] is currently taking prescription vit D and his last level was not at goal. He denies nausea, vomiting or muscle weakness.  ALLERGIES: Allergies  Allergen Reactions  . No Known Allergies     MEDICATIONS: Current Outpatient Medications on File Prior to Visit  Medication Sig Dispense Refill  . acetaminophen (TYLENOL) 500 MG tablet Take 1,000 mg by mouth every 6 (six) hours as needed (for pain.).    Marland Kitchen diclofenac (VOLTAREN) 75 MG EC tablet TAKE 1 TABLET (75 MG TOTAL) BY MOUTH 2 (TWO) TIMES DAILY. 60 tablet 0  . Insulin Pen Needle (BD PEN NEEDLE NANO 2ND GEN)  32G X 4 MM MISC 1 Package by Does not apply route 2 (two) times daily. 100 each 0  . Liraglutide -Weight Management (SAXENDA) 18 MG/3ML SOPN Inject 0.3 mg into the skin daily at 12 noon. 5 pen 0  . losartan (COZAAR) 100 MG tablet TAKE 1 TABLET BY MOUTH EVERY DAY 30 tablet 2  . Vitamin D, Ergocalciferol, (DRISDOL) 50000 units CAPS capsule Take 1 capsule (50,000 Units total) by mouth every 7 (seven) days. 4 capsule 0   No current facility-administered medications on file prior to visit.     PAST MEDICAL HISTORY: Past Medical History:  Diagnosis Date  . Allergy   . Arthritis    in both knees  . GERD (gastroesophageal reflux disease)   . Headache   . HTN (hypertension)   . Obesity     PAST SURGICAL HISTORY: Past Surgical History:  Procedure Laterality Date  . ACHILLES TENDON REPAIR    . ANTERIOR CRUCIATE LIGAMENT REPAIR    . LUMBAR LAMINECTOMY/DECOMPRESSION MICRODISCECTOMY Right 09/19/2016   Procedure: Right Lumbar Five-Sacral one Laminotomy/Foraminotomy/removal of Synovial cyst;  Surgeon: [name], MD;  Location: [facility] OR;  Service: Neurosurgery;  Laterality: Right;  Right L5-S1 Laminotomy/Foraminotomy/removal of Synovial cyst  . POSTERIOR CERVICAL LAMINECTOMY N/A 06/12/2016   Procedure: Cervical One Posterior cervical laminectomy;  Surgeon: [name], MD;  Location: [facility] OR;  Service: Neurosurgery;  Laterality: N/A;  posterior  .  TONSILLECTOMY      SOCIAL HISTORY: Social History   Tobacco Use  . Smoking status: Former Smoker    Packs/day: 0.25    Years: 3.00    Pack years: 0.75    Types: Cigarettes    Last attempt to quit: 06/14/2016    Years since quitting: 1.4  . Smokeless tobacco: Never Used  Substance Use Topics  . 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  . Drug use: No    FAMILY HISTORY: Family History  Problem Relation Age of Onset  . Diabetes Father   . Obesity  Father     ROS: Review of Systems  Constitutional: Negative for weight loss.  Gastrointestinal: Negative for diarrhea, nausea and vomiting.  Genitourinary: Negative for frequency.  Musculoskeletal:       Negative for muscle weakness  Endo/Heme/Allergies: Negative for polydipsia.       Negative for polyphagia Negative for hypoglycemia    PHYSICAL EXAM: Blood pressure (!) 145/63, pulse 78, temperature 98.4 F (36.9 C), temperature source Oral, height 6' (1.829 m), weight (!) 406 lb (184.2 kg), SpO2 98 %. Body mass index is 55.06 kg/m. Physical Exam  Constitutional: He is oriented to person, place, and time. He appears well-developed and well-nourished.  Cardiovascular: Normal rate.  Pulmonary/Chest: Effort normal.  Musculoskeletal: Normal range of motion.  Neurological: He is oriented to person, place, and time.  Skin: Skin is warm and dry.  Psychiatric: He has a normal mood and affect. His behavior is normal.  Vitals reviewed.   RECENT LABS AND TESTS: BMET    Component Value Date/Time   NA 141 10/30/2017 0803   K 4.3 10/30/2017 0803   CL 101 10/30/2017 0803   CO2 26 10/30/2017 0803   GLUCOSE 90 10/30/2017 0803   GLUCOSE 98 07/16/2017 0911   BUN 9 10/30/2017 0803   CREATININE 0.98 10/30/2017 0803   CREATININE 0.94 11/15/2015 0833   CALCIUM 9.0 10/30/2017 0803   GFRNONAA 95 10/30/2017 0803   GFRNONAA >89 11/15/2015 0833   GFRAA 109 10/30/2017 0803   GFRAA >89 11/15/2015 0833   Lab Results  Component Value Date   HGBA1C 5.7 (H) 10/30/2017   HGBA1C 5.9 07/16/2017   HGBA1C 5.9 (H) 03/08/2017   HGBA1C 5.8 11/16/2015   Lab Results  Component Value Date   INSULIN 15.6 10/30/2017   INSULIN 18.0 03/08/2017   CBC    Component Value Date/Time   WBC 8.6 03/08/2017 0932   WBC 8.8 09/14/2016 1140   RBC 4.63 03/08/2017 0932   RBC 4.89 09/14/2016 1140   HGB 13.2 03/08/2017 0932   HCT 41.5 03/08/2017 0932   PLT 285 03/08/2017 0932   MCV 90 03/08/2017 0932   MCH  28.5 03/08/2017 0932   MCH 27.8 09/14/2016 1140   MCHC 31.8 03/08/2017 0932   MCHC 31.8 09/14/2016 1140   RDW 14.7 03/08/2017 0932   LYMPHSABS 2.4 03/08/2017 0932   MONOABS 0.4 11/15/2015 0833   EOSABS 0.1 03/08/2017 0932   BASOSABS 0.0 03/08/2017 0932   Iron/TIBC/Ferritin/ %Sat No results found for: IRON, TIBC, FERRITIN, IRONPCTSAT Lipid Panel     Component Value Date/Time   CHOL 140 10/30/2017 0803   TRIG 54 10/30/2017 0803   HDL 43 10/30/2017 0803   CHOLHDL 3 07/16/2017 0911   VLDL 13.0 07/16/2017 0911   LDLCALC 86 10/30/2017 0803  Hepatic Function Panel     Component Value Date/Time   PROT 7.6 10/30/2017 0803   ALBUMIN 4.1 10/30/2017 0803   AST 21 10/30/2017 0803   ALT 22 10/30/2017 0803   ALKPHOS 90 10/30/2017 0803   BILITOT 0.3 10/30/2017 0803      Component Value Date/Time   TSH 1.260 03/08/2017 0932   TSH 1.07 11/15/2015 0833    ASSESSMENT AND PLAN: Prediabetes - Plan: metFORMIN (GLUCOPHAGE) 500 MG tablet  Vitamin D deficiency  At risk for diabetes mellitus  Class 3 severe obesity with serious comorbidity and body mass index (BMI) of 50.0 to 59.9 in adult, unspecified obesity type (HCC)  PLAN:  Pre-Diabetes Antinio will continue to work on weight loss, exercise, and decreasing simple carbohydrates in his diet to help decrease the risk of diabetes. We dicussed metformin including benefits and risks. He was informed that eating too many simple carbohydrates or too many calories at one sitting increases the likelihood of GI side effects. Merlyn agrees to continue metformin for now and a prescription was written today for 1 month refill. Keighan agreed to follow up with [name] as directed to monitor his progress.  Diabetes risk counseling Naitik was given extended (15 minutes) diabetes prevention counseling today. He is 42 y.o. male and has risk factors for diabetes including obesity and pre-diabetes. We discussed intensive lifestyle modifications today with an  emphasis on weight loss as well as increasing exercise and decreasing simple carbohydrates in his diet.  Vitamin D Deficiency Carrol was informed that low vitamin D levels contributes to fatigue and are associated with obesity, breast, and colon cancer. He agrees to continue to take prescription Vit D @50 ,000 IU every week and will follow up for routine testing of vitamin D, at least 2-3 times per year. He was informed of the risk of over-replacement of vitamin D and agrees to not increase his dose unless he discusses this with [name] first.  Obesity Sabatino is currently in the action stage of change. As such, his goal is to continue with weight loss efforts He has agreed to follow the Category 3 plan modified for breakfast Hikaru has been instructed to work up to a goal of 150 minutes of combined cardio and strengthening exercise per week for weight loss and overall health benefits. We discussed the following Behavioral Modification Strategies today: work on meal planning and easy cooking plans and planning for success  Abbie has agreed to follow up with our clinic in 3 weeks. He was informed of the importance of frequent follow up visits to maximize his success with intensive lifestyle modifications for his multiple health conditions.   OBESITY BEHAVIORAL INTERVENTION VISIT  Today's visit was # 14   Starting weight: 406 lbs Starting date: 11/13/2017 Today's weight : 406 lbs Today's date: 11/13/2017 Total lbs lost to date: 0   ASK: We discussed the diagnosis of obesity with [name] today and Denny Peon agreed to give [name] permission to discuss obesity behavioral modification therapy today.  ASSESS: Javarius has the diagnosis of obesity and his BMI today is 55.05 Azreal is in the action stage of change   ADVISE: Gen was educated on the multiple health risks of obesity as well as the benefit of weight loss to improve his health. He was advised of the need for long term treatment and the  importance of lifestyle modifications to improve his current health and to decrease his risk of future health problems.  AGREE: Multiple dietary modification options and treatment  options were discussed and  Ramelo agreed to follow the recommendations documented in the above note.  ARRANGE: Chee was educated on the importance of frequent visits to treat obesity as outlined per CMS and USPSTF guidelines and agreed to schedule his next follow up appointment today.  Cristi Loron, am acting as transcriptionist for Alois Cliche, PA-C I, Alois Cliche, PA-C have reviewed above note and agree with its content

## 2017-12-03 ENCOUNTER — Encounter (INDEPENDENT_AMBULATORY_CARE_PROVIDER_SITE_OTHER): Payer: Self-pay

## 2017-12-03 ENCOUNTER — Ambulatory Visit (INDEPENDENT_AMBULATORY_CARE_PROVIDER_SITE_OTHER): Payer: Managed Care, Other (non HMO) | Admitting: Physician Assistant

## 2017-12-05 ENCOUNTER — Ambulatory Visit (INDEPENDENT_AMBULATORY_CARE_PROVIDER_SITE_OTHER): Payer: Managed Care, Other (non HMO) | Admitting: Physician Assistant

## 2017-12-12 ENCOUNTER — Ambulatory Visit (INDEPENDENT_AMBULATORY_CARE_PROVIDER_SITE_OTHER): Payer: Managed Care, Other (non HMO) | Admitting: Bariatrics

## 2017-12-12 ENCOUNTER — Other Ambulatory Visit: Payer: Self-pay | Admitting: Medical

## 2017-12-12 ENCOUNTER — Encounter (INDEPENDENT_AMBULATORY_CARE_PROVIDER_SITE_OTHER): Payer: Self-pay | Admitting: Bariatrics

## 2017-12-12 VITALS — BP 135/82 | HR 75 | Temp 98.4°F | Ht 72.0 in | Wt 399.0 lb

## 2017-12-12 DIAGNOSIS — Z9189 Other specified personal risk factors, not elsewhere classified: Secondary | ICD-10-CM

## 2017-12-12 DIAGNOSIS — E559 Vitamin D deficiency, unspecified: Secondary | ICD-10-CM | POA: Diagnosis not present

## 2017-12-12 DIAGNOSIS — Z6841 Body Mass Index (BMI) 40.0 and over, adult: Secondary | ICD-10-CM

## 2017-12-12 DIAGNOSIS — R7303 Prediabetes: Secondary | ICD-10-CM | POA: Diagnosis not present

## 2017-12-12 MED ORDER — LIRAGLUTIDE -WEIGHT MANAGEMENT 18 MG/3ML ~~LOC~~ SOPN: 1.8000 mg | PEN_INJECTOR | Freq: Every day | SUBCUTANEOUS | 0 refills | Status: DC

## 2017-12-13 DIAGNOSIS — Z6841 Body Mass Index (BMI) 40.0 and over, adult: Secondary | ICD-10-CM | POA: Insufficient documentation

## 2017-12-13 NOTE — Progress Notes (Signed)
Office: [phone]  /  Fax: [phone]   HPI:   Chief Complaint: OBESITY Louis [name] is here to discuss his progress with his obesity treatment plan. He is on the Category 3 plan and is following his eating plan approximately 90 % of the time. He states he is doing 4200 steps a day 7 times per week. Louis [name] has done well with the plan. He denies struggles. His hunger has fluctuated. Louis [name] is taking liraglutide 0.3 mg daily at noon. His weight is (!) 399 lb (181 kg) today and has had a weight loss of 7 pounds over a period of 4 weeks since his last visit. He has lost 7 lbs since starting treatment with Louis [name].  Pre-Diabetes Louis [name] has a diagnosis of prediabetes based on his elevated Hgb A1c and was informed this puts him at greater risk of developing diabetes. He is taking metformin currently and continues to work on diet and exercise to decrease risk of diabetes. He denies hypoglycemia.  At risk for diabetes Louis [name] is at higher than average risk for developing diabetes due to his obesity and prediabetes. He currently denies polyuria or polydipsia.  Vitamin D deficiency Louis [name] has a diagnosis of vitamin D deficiency. He is currently taking vit D and denies nausea, vomiting or muscle weakness.  ALLERGIES: Allergies  Allergen Reactions  . No Known Allergies     MEDICATIONS: Current Outpatient Medications on File Prior to Visit  Medication Sig Dispense Refill  . acetaminophen (TYLENOL) 500 MG tablet Take 1,000 mg by mouth every 6 (six) hours as needed (for pain.).    . Insulin Pen Needle (BD PEN NEEDLE NANO 2ND GEN) 32G X 4 MM MISC 1 Package by Does not apply route 2 (two) times daily. 100 each 0  . losartan (COZAAR) 100 MG tablet TAKE 1 TABLET BY MOUTH EVERY DAY 30 tablet 2  . metFORMIN (GLUCOPHAGE) 500 MG tablet Take 1 tablet (500 mg total) by mouth daily with breakfast. 30 tablet 0  . Vitamin D, Ergocalciferol, (DRISDOL) 50000 units CAPS capsule Take 1 capsule (50,000 Units total) by mouth  every 7 (seven) days. 4 capsule 0   No current facility-administered medications on file prior to visit.     PAST MEDICAL HISTORY: Past Medical History:  Diagnosis Date  . Allergy   . Arthritis    in both knees  . GERD (gastroesophageal reflux disease)   . Headache   . HTN (hypertension)   . Obesity     PAST SURGICAL HISTORY: Past Surgical History:  Procedure Laterality Date  . ACHILLES TENDON REPAIR    . ANTERIOR CRUCIATE LIGAMENT REPAIR    . LUMBAR LAMINECTOMY/DECOMPRESSION MICRODISCECTOMY Right 09/19/2016   Procedure: Right Lumbar Five-Sacral one Laminotomy/Foraminotomy/removal of Synovial cyst;  Surgeon: Barnett AbuElsner, Henry, MD;  [facility];  Service: Neurosurgery;  Laterality: Right;  Right L5-S1 Laminotomy/Foraminotomy/removal of Synovial cyst  . POSTERIOR CERVICAL LAMINECTOMY N/A 06/12/2016   Procedure: Cervical One Posterior cervical laminectomy;  Surgeon: Barnett AbuElsner, Henry, MD;  [facility];  Service: Neurosurgery;  Laterality: N/A;  posterior  . TONSILLECTOMY      SOCIAL HISTORY: Social History   Tobacco Use  . Smoking status: Former Smoker    Packs/day: 0.25    Years: 3.00    Pack years: 0.75    Types: Cigarettes    Last attempt to quit: 06/14/2016    Years since quitting: 1.4  . Smokeless tobacco: Never Used  Substance Use Topics  . 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  . Drug use: No    FAMILY HISTORY: Family History  Problem Relation Age of Onset  . Diabetes Father   . Obesity Father     ROS: Review of Systems  Constitutional: Positive for weight loss.  Gastrointestinal: Negative for nausea and vomiting.  Genitourinary: Negative for frequency.  Musculoskeletal:       Negative for muscle weakness  Endo/Heme/Allergies: Negative for polydipsia.       Negative for hypoglycemia    PHYSICAL EXAM: Blood pressure 135/82, pulse 75, temperature 98.4 F  (36.9 C), temperature source Oral, height 6' (1.829 m), weight (!) 399 lb (181 kg), SpO2 98 %. Body mass index is 54.11 kg/m. Physical Exam Vitals signs reviewed.  Constitutional:      Appearance: Normal appearance. He is well-developed. He is obese.  Cardiovascular:     Rate and Rhythm: Normal rate.  Pulmonary:     Effort: Pulmonary effort is normal.  Musculoskeletal: Normal range of motion.  Skin:    General: Skin is warm and dry.  Neurological:     Mental Status: He is alert and oriented to person, place, and time.  Psychiatric:        Mood and Affect: Mood normal.        Behavior: Behavior normal.     RECENT LABS AND TESTS: BMET    Component Value Date/Time   NA 141 10/30/2017 0803   K 4.3 10/30/2017 0803   CL 101 10/30/2017 0803   CO2 26 10/30/2017 0803   GLUCOSE 90 10/30/2017 0803   GLUCOSE 98 07/16/2017 0911   BUN 9 10/30/2017 0803   CREATININE 0.98 10/30/2017 0803   CREATININE 0.94 11/15/2015 0833   CALCIUM 9.0 10/30/2017 0803   GFRNONAA 95 10/30/2017 0803   GFRNONAA >89 11/15/2015 0833   GFRAA 109 10/30/2017 0803   GFRAA >89 11/15/2015 0833   Lab Results  Component Value Date   HGBA1C 5.7 (H) 10/30/2017   HGBA1C 5.9 07/16/2017   HGBA1C 5.9 (H) 03/08/2017   HGBA1C 5.8 11/16/2015   Lab Results  Component Value Date   INSULIN 15.6 10/30/2017   INSULIN 18.0 03/08/2017   CBC    Component Value Date/Time   WBC 8.6 03/08/2017 0932   WBC 8.8 09/14/2016 1140   RBC 4.63 03/08/2017 0932   RBC 4.89 09/14/2016 1140   HGB 13.2 03/08/2017 0932   HCT 41.5 03/08/2017 0932   PLT 285 03/08/2017 0932   MCV 90 03/08/2017 0932   MCH 28.5 03/08/2017 0932   MCH 27.8 09/14/2016 1140   MCHC 31.8 03/08/2017 0932   MCHC 31.8 09/14/2016 1140   RDW 14.7 03/08/2017 0932   LYMPHSABS 2.4 03/08/2017 0932   MONOABS 0.4 11/15/2015 0833   EOSABS 0.1 03/08/2017 0932   BASOSABS 0.0 03/08/2017 0932   Iron/TIBC/Ferritin/ %Sat No results found for: IRON, TIBC, FERRITIN,  IRONPCTSAT Lipid Panel     Component Value Date/Time   CHOL 140 10/30/2017 0803   TRIG 54 10/30/2017 0803   HDL 43 10/30/2017 0803   CHOLHDL 3 07/16/2017 0911   VLDL 13.0 07/16/2017 0911   LDLCALC 86 10/30/2017 0803   Hepatic Function Panel     Component Value Date/Time   PROT 7.6 10/30/2017 0803   ALBUMIN 4.1 10/30/2017 0803   AST 21 10/30/2017 0803   ALT 22 10/30/2017 0803   ALKPHOS 90 10/30/2017  0803   BILITOT 0.3 10/30/2017 0803      Component Value Date/Time   TSH 1.260 03/08/2017 0932   TSH 1.07 11/15/2015 0833    Ref. Range 10/30/2017 08:03  Vitamin D, 25-Hydroxy Latest Ref Range: 30.0 - 100.0 ng/mL 33.9   ASSESSMENT AND PLAN: Prediabetes  Vitamin D deficiency  At risk for diabetes mellitus  Class 3 severe obesity with serious comorbidity and body mass index (BMI) of 50.0 to 59.9 in adult, unspecified obesity type (HCC) - Plan: Liraglutide -Weight Management (SAXENDA) 18 MG/3ML SOPN  PLAN:  Pre-Diabetes Louis [name] will continue to work on weight loss, exercise, increasing lean protein and decreasing simple carbohydrates in his diet to help decrease the risk of diabetes. He was informed that eating too many simple carbohydrates or too many calories at one sitting increases the likelihood of GI side effects. Louis [name] agreed to follow up with [name] as directed to monitor his progress.  Diabetes risk counseling Louis [name] was given extended (15 minutes) diabetes prevention counseling today. He is 42 y.o. male and has risk factors for diabetes including obesity and prediabetes. We discussed intensive lifestyle modifications today with an emphasis on weight loss as well as increasing exercise and decreasing simple carbohydrates in his diet.  Vitamin D Deficiency Louis [name] was informed that low vitamin D levels contributes to fatigue and are associated with obesity, breast, and colon cancer. He agrees to continue to take prescription Vit D @50 ,000 IU every week and will follow up for  routine testing of vitamin D, at least 2-3 times per year. He was informed of the risk of over-replacement of vitamin D and agrees to not increase his dose unless he discusses this with [name] first.  Obesity Ashely is currently in the action stage of change. As such, his goal is to continue with weight loss efforts He has agreed to follow the Category 3 modified with breakfast or the modified Category 4 plan. Lynton has been instructed to work up to a goal of 150 minutes of combined cardio and strengthening exercise per week for weight loss and overall health benefits. We discussed the following Behavioral Modification Strategies today: increase H2O intake, increasing lean protein intake, decreasing simple carbohydrates , increasing vegetables and work on meal planning and easy cooking plans We discussed various medication options to help Delanson with his weight loss efforts and we both agreed to continue liraglutide. Prescription was written today for liraglutide 18 mg/3 ml, increase to 1.8 mg daily into skin #1 month with no refills.  Husain has agreed to follow up with our clinic in 2 weeks. He was informed of the importance of frequent follow up visits to maximize his success with intensive lifestyle modifications for his multiple health conditions.   OBESITY BEHAVIORAL INTERVENTION VISIT  Today's visit was # 15   Starting weight: 406 lbs Starting date: 11/13/2017 Today's weight : 399 lbs Today's date: 12/12/2017 Total lbs lost to date: 7   ASK: We discussed the diagnosis of obesity with [name] today and Louis [name] agreed to give [name] permission to discuss obesity behavioral modification therapy today.  ASSESS: Nathan has the diagnosis of obesity and his BMI today is 54.1 Flavius is in the action stage of change   ADVISE: Ferguson was educated on the multiple health risks of obesity as well as the benefit of weight loss to improve his health. He was advised of the need for long term  treatment and the importance of lifestyle modifications to improve his current health and  to decrease his risk of future health problems.  AGREE: Multiple dietary modification options and treatment options were discussed and  Damonta agreed to follow the recommendations documented in the above note.  ARRANGE: Emeril was educated on the importance of frequent visits to treat obesity as outlined per CMS and USPSTF guidelines and agreed to schedule his next follow up appointment today.  Cristi Loron, am acting as Energy manager for [employer]. Manson Passey, DO  I have reviewed the above documentation for accuracy and completeness, and I agree with the above. -Corinna Capra, DO
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
## 2018-01-09 ENCOUNTER — Ambulatory Visit (INDEPENDENT_AMBULATORY_CARE_PROVIDER_SITE_OTHER): Payer: Managed Care, Other (non HMO) | Admitting: Physician Assistant

## 2018-01-09 ENCOUNTER — Encounter (INDEPENDENT_AMBULATORY_CARE_PROVIDER_SITE_OTHER): Payer: Self-pay | Admitting: Physician Assistant

## 2018-01-09 VITALS — BP 165/71 | HR 82 | Temp 97.9°F | Ht 72.0 in | Wt 399.0 lb

## 2018-01-09 DIAGNOSIS — E559 Vitamin D deficiency, unspecified: Secondary | ICD-10-CM

## 2018-01-09 DIAGNOSIS — Z9189 Other specified personal risk factors, not elsewhere classified: Secondary | ICD-10-CM | POA: Diagnosis not present

## 2018-01-09 DIAGNOSIS — R7303 Prediabetes: Secondary | ICD-10-CM

## 2018-01-09 DIAGNOSIS — Z6841 Body Mass Index (BMI) 40.0 and over, adult: Secondary | ICD-10-CM

## 2018-01-09 MED ORDER — VITAMIN D (ERGOCALCIFEROL) 1.25 MG (50000 UNIT) PO CAPS: 50000.0000 [IU] | ORAL_CAPSULE | ORAL | 0 refills | Status: DC

## 2018-01-10 ENCOUNTER — Other Ambulatory Visit: Payer: Self-pay | Admitting: Medical

## 2018-01-10 DIAGNOSIS — I10 Essential (primary) hypertension: Secondary | ICD-10-CM

## 2018-01-10 NOTE — Progress Notes (Signed)
Office: [phone]  /  Fax: [phone]   HPI:   Chief Complaint: OBESITY Sharman is here to discuss his progress with his obesity treatment plan. He is on the Category 3 Modified with breakfast or the modified Category 4 plan and is following his eating plan approximately 80 % of the time. Hestates he is exercising 0 minutes 0 times per week. Kendry did well with weight maintenance over the holidays. He is back in his day-to-day regimen and getting back on track. His weight is (!) 399 lb (181 kg) today and has lost 0 lbs since his last visit. He has lost 7 lbs since starting treatment with [name].  Vitamin D deficiency Christoval has a diagnosis of vitamin D deficiency. He is currently taking prescription Vit D and denies nausea, vomiting or muscle weakness.  Pre-Diabetes Kaidence has a diagnosis of prediabetes based on his elevated Hgb A1c and was informed this puts him at greater risk of developing diabetes. He is taking metformin and Saxenda currently and continues to work on diet and exercise to decrease risk of diabetes. He denies nausea, vomiting or diarrhea. He denies hunger or polyphagia.  At risk for osteopenia and osteoporosis Zohaib is at higher risk of osteopenia and osteoporosis due to vitamin D deficiency.   ASSESSMENT AND PLAN:  Vitamin D deficiency - Plan: Vitamin D, Ergocalciferol, (DRISDOL) 1.25 MG (50000 UT) CAPS capsule  Prediabetes  At risk for osteoporosis  Class 3 severe obesity with serious comorbidity and body mass index (BMI) of 50.0 to 59.9 in adult, unspecified obesity type (HCC)  PLAN:  Vitamin D Deficiency Vikas was informed that low vitamin D levels contributes to fatigue and are associated with obesity, breast, and colon cancer. He agrees to continue to take prescription Vit D 50,000 IU every week #4 with no refills and will follow up for routine testing of vitamin D, at least 2-3 times per year. He was informed of the risk of over-replacement of vitamin D and  agrees to not increase his dose unless he discusses this with [name] first. Ulysees agrees to follow up with our clinic in 3 weeks.  Pre-Diabetes Keziah will continue to work on weight loss, exercise, and decreasing simple carbohydrates in his diet to help decrease the risk of diabetes. He was informed that eating too many simple carbohydrates or too many calories at one sitting increases the likelihood of GI side effects. Vann agrees to continue with weight loss and taking his medications. Deniz agreed to follow up with [name] as directed to monitor his progress.  At risk for osteopenia and osteoporosis Bassil was given extended  (15 minutes) osteoporosis prevention counseling today. Janos is at risk for osteopenia and osteoporosis due to his vitamin D deficiency. He was encouraged to take his vitamin D and follow his higher calcium diet and increase strengthening exercise to help strengthen his bones and decrease his risk of osteopenia and osteoporosis.  Obesity Ritesh is currently in the action stage of change. As such, his goal is to continue with weight loss efforts He has agreed to follow the Category 3 Modified with breakfast or the modified Category 4 plan  Malakia has been instructed to work up to a goal of 150 minutes of combined cardio and strengthening exercise per week for weight loss and overall health benefits. We discussed the following Behavioral Modification Strategies today: work on meal planning and cooking strategies. Tajay has agreed to follow up with our clinic in 3 weeks. He was informed of the  importance of frequent follow up visits to maximize his success with intensive lifestyle modifications for his multiple health conditions.  ALLERGIES: Allergies  Allergen Reactions  . No Known Allergies     MEDICATIONS: Current Outpatient Medications on File Prior to Visit  Medication Sig Dispense Refill  . acetaminophen (TYLENOL) 500 MG tablet Take 1,000 mg by mouth every 6 (six) hours as  needed (for pain.).    Marland Kitchen. diclofenac (VOLTAREN) 75 MG EC tablet TAKE 1 TABLET (75 MG TOTAL) BY MOUTH 2 (TWO) TIMES DAILY. 60 tablet 0  . Insulin Pen Needle (BD PEN NEEDLE NANO 2ND GEN) 32G X 4 MM MISC 1 Package by Does not apply route 2 (two) times daily. 100 each 0  . Liraglutide -Weight Management (SAXENDA) 18 MG/3ML SOPN Inject 1.8 mg into the skin daily at 12 noon. 5 pen 0  . losartan (COZAAR) 100 MG tablet TAKE 1 TABLET BY MOUTH EVERY DAY 30 tablet 2  . metFORMIN (GLUCOPHAGE) 500 MG tablet Take 1 tablet (500 mg total) by mouth daily with breakfast. 30 tablet 0   No current facility-administered medications on file prior to visit.     PAST MEDICAL HISTORY: Past Medical History:  Diagnosis Date  . Allergy   . Arthritis    in both knees  . GERD (gastroesophageal reflux disease)   . Headache   . HTN (hypertension)   . Obesity     PAST SURGICAL HISTORY: Past Surgical History:  Procedure Laterality Date  . ACHILLES TENDON REPAIR    . ANTERIOR CRUCIATE LIGAMENT REPAIR    . LUMBAR LAMINECTOMY/DECOMPRESSION MICRODISCECTOMY Right 09/19/2016   Procedure: Right Lumbar Five-Sacral one Laminotomy/Foraminotomy/removal of Synovial cyst;  Surgeon: Barnett AbuElsner, Henry, MD;  [facility];  Service: Neurosurgery;  Laterality: Right;  Right L5-S1 Laminotomy/Foraminotomy/removal of Synovial cyst  . POSTERIOR CERVICAL LAMINECTOMY N/A 06/12/2016   Procedure: Cervical One Posterior cervical laminectomy;  Surgeon: Barnett AbuElsner, Henry, MD;  [facility];  Service: Neurosurgery;  Laterality: N/A;  posterior  . TONSILLECTOMY      SOCIAL HISTORY: Social History   Tobacco Use  . Smoking status: Former Smoker    Packs/day: 0.25    Years: 3.00    Pack years: 0.75    Types: Cigarettes    Last attempt to quit: 06/14/2016    Years since quitting: 1.5  . Smokeless tobacco: Never Used  Substance Use Topics  . 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  . Drug use: No    FAMILY HISTORY: Family History  Problem Relation Age of Onset  . Diabetes Father   . Obesity Father     ROS: Review of Systems  Constitutional: Negative for weight loss.  Gastrointestinal: Negative for diarrhea, nausea and vomiting.  Musculoskeletal:       Negative for muscle weakness  Endo/Heme/Allergies:       Negative for polyphagia    PHYSICAL EXAM: Blood pressure (!) 165/71, pulse 82, temperature 97.9 F (36.6 C), temperature source Oral, height 6' (1.829 m), weight (!) 399 lb (181 kg), SpO2 98 %. Body mass index is 54.11 kg/m. Physical Exam Vitals signs reviewed.  Constitutional:      Appearance: Normal appearance. He is obese.  Cardiovascular:     Rate and Rhythm: Normal rate.  Pulmonary:     Effort: Pulmonary effort is normal.  Musculoskeletal: Normal range of motion.  Skin:    General: Skin is warm and dry.  Neurological:     Mental Status: He is alert and oriented to person, place, and time.  Psychiatric:        Mood and Affect: Mood normal.     RECENT LABS AND TESTS: BMET    Component Value Date/Time   NA 141 10/30/2017 0803   K 4.3 10/30/2017 0803   CL 101 10/30/2017 0803   CO2 26 10/30/2017 0803   GLUCOSE 90 10/30/2017 0803   GLUCOSE 98 07/16/2017 0911   BUN 9 10/30/2017 0803   CREATININE 0.98 10/30/2017 0803   CREATININE 0.94 11/15/2015 0833   CALCIUM 9.0 10/30/2017 0803   GFRNONAA 95 10/30/2017 0803   GFRNONAA >89 11/15/2015 0833   GFRAA 109 10/30/2017 0803   GFRAA >89 11/15/2015 0833   Lab Results  Component Value Date   HGBA1C 5.7 (H) 10/30/2017   HGBA1C 5.9 07/16/2017   HGBA1C 5.9 (H) 03/08/2017   HGBA1C 5.8 11/16/2015   Lab Results  Component Value Date   INSULIN 15.6 10/30/2017   INSULIN 18.0 03/08/2017   CBC    Component Value Date/Time   WBC 8.6 03/08/2017 0932   WBC 8.8 09/14/2016 1140   RBC 4.63 03/08/2017 0932   RBC 4.89 09/14/2016 1140   HGB 13.2 03/08/2017  0932   HCT 41.5 03/08/2017 0932   PLT 285 03/08/2017 0932   MCV 90 03/08/2017 0932   MCH 28.5 03/08/2017 0932   MCH 27.8 09/14/2016 1140   MCHC 31.8 03/08/2017 0932   MCHC 31.8 09/14/2016 1140   RDW 14.7 03/08/2017 0932   LYMPHSABS 2.4 03/08/2017 0932   MONOABS 0.4 11/15/2015 0833   EOSABS 0.1 03/08/2017 0932   BASOSABS 0.0 03/08/2017 0932   Iron/TIBC/Ferritin/ %Sat No results found for: IRON, TIBC, FERRITIN, IRONPCTSAT Lipid Panel     Component Value Date/Time   CHOL 140 10/30/2017 0803   TRIG 54 10/30/2017 0803   HDL 43 10/30/2017 0803   CHOLHDL 3 07/16/2017 0911   VLDL 13.0 07/16/2017 0911   LDLCALC 86 10/30/2017 0803   Hepatic Function Panel     Component Value Date/Time   PROT 7.6 10/30/2017 0803   ALBUMIN 4.1 10/30/2017 0803   AST 21 10/30/2017 0803   ALT 22 10/30/2017 0803   ALKPHOS 90 10/30/2017 0803   BILITOT 0.3 10/30/2017 0803      Component Value Date/Time   TSH 1.260 03/08/2017 0932   TSH 1.07 11/15/2015 0833     Ref. Range 10/30/2017 08:03  Vitamin D, 25-Hydroxy Latest Ref Range: 30.0 - 100.0 ng/mL 33.9      OBESITY BEHAVIORAL INTERVENTION VISIT  Today's visit was # 16   Starting weight: 406 lbs Starting date: 03/08/2017 Today's weight : 399 lbs  Today's date: 01/09/2018 Total lbs lost to date: 7  ASK: We discussed the diagnosis of obesity with [name] today and Denny Peon agreed to give [name] permission to discuss obesity behavioral modification therapy today.  ASSESS: Elridge has the diagnosis of obesity and his BMI today is 54.1 Wagner is in the action stage of change   ADVISE: Dermont was educated on the multiple health risks of obesity as well as the benefit of weight loss to improve his health. He was advised of the need for long term treatment and the importance of lifestyle modifications to improve his current health and to decrease his risk of future health problems.  AGREE: Multiple dietary modification options and treatment  options were  discussed and  [name] agreed to follow the recommendations documented in the above note.  ARRANGE: [name] was educated on the importance of frequent visits to treat obesity as outlined per CMS and USPSTF guidelines and agreed to schedule his next follow up appointment today.  I, Tammy Wysor, am acting as Energy managertranscriptionist for [employer] Ariabella Brien, PA-C I, Alois Clicheracey Elnathan Fulford, PA-C have reviewed above note and agree with its content

## 2018-01-15 ENCOUNTER — Other Ambulatory Visit (INDEPENDENT_AMBULATORY_CARE_PROVIDER_SITE_OTHER): Payer: Self-pay | Admitting: Physician Assistant

## 2018-01-15 DIAGNOSIS — R7303 Prediabetes: Secondary | ICD-10-CM

## 2018-01-27 ENCOUNTER — Other Ambulatory Visit: Payer: Self-pay | Admitting: Medical

## 2018-01-28 NOTE — Telephone Encounter (Signed)
Pt is due for follow please call and schedule appointment.

## 2018-01-29 NOTE — Telephone Encounter (Signed)
LVM for pt to schedule appt with PCP. Pt needs fu appt.

## 2018-01-30 ENCOUNTER — Other Ambulatory Visit (INDEPENDENT_AMBULATORY_CARE_PROVIDER_SITE_OTHER): Payer: Self-pay | Admitting: Physician Assistant

## 2018-01-30 ENCOUNTER — Ambulatory Visit (INDEPENDENT_AMBULATORY_CARE_PROVIDER_SITE_OTHER): Payer: Managed Care, Other (non HMO) | Admitting: Physician Assistant

## 2018-01-30 ENCOUNTER — Encounter (INDEPENDENT_AMBULATORY_CARE_PROVIDER_SITE_OTHER): Payer: Self-pay | Admitting: Physician Assistant

## 2018-01-30 VITALS — BP 157/71 | HR 79 | Temp 97.7°F | Ht 72.0 in | Wt 396.0 lb

## 2018-01-30 DIAGNOSIS — R7303 Prediabetes: Secondary | ICD-10-CM | POA: Diagnosis not present

## 2018-01-30 DIAGNOSIS — Z9189 Other specified personal risk factors, not elsewhere classified: Secondary | ICD-10-CM | POA: Diagnosis not present

## 2018-01-30 DIAGNOSIS — E559 Vitamin D deficiency, unspecified: Secondary | ICD-10-CM

## 2018-01-30 DIAGNOSIS — I10 Essential (primary) hypertension: Secondary | ICD-10-CM | POA: Diagnosis not present

## 2018-01-30 DIAGNOSIS — Z6841 Body Mass Index (BMI) 40.0 and over, adult: Secondary | ICD-10-CM

## 2018-01-30 MED ORDER — INSULIN PEN NEEDLE 32G X 4 MM MISC: 1.0000 | Freq: Two times a day (BID) | 0 refills | Status: DC

## 2018-01-30 MED ORDER — SEMAGLUTIDE 3 MG PO TABS: 3.0000 mg | ORAL_TABLET | Freq: Every day | ORAL | 0 refills | Status: DC

## 2018-01-31 ENCOUNTER — Telehealth: Payer: Self-pay | Admitting: Medical

## 2018-01-31 NOTE — Progress Notes (Signed)
Office: [phone]  /  Fax: [phone]   HPI:   Chief Complaint: OBESITY Louis [name] is here to discuss his progress with his obesity treatment plan. He is on the Category 3 Modified with breakfast or the modified Category 4 plan  and is following his eating plan approximately 90 % of the time. He states he is exercising 0 minutes 0 times per week. [name] did well with weight loss. He reports that he is undereating his protein on some days, depending on the weather. He states that cold weather makes him not want to eat. His weight is (!) 396 lb (179.6 kg) today and has had a weight loss of 3 pounds over a period of 3 weeks since his last visit. He has lost 10 lbs since starting treatment with us.  Pre-Diabetes Louis [name] has a diagnosis of prediabetes based on his elevated Hgb A1c and was informed this puts him at greater risk of developing diabetes. He is taking Saxenda currently and continues to work on diet and exercise to decrease risk of diabetes. He denies nausea, vomiting or diarrhea. He denies hypoglycemia or polyphagia.   Hypertension Louis Grandchildvery Louis [name] is a 43 y.o. male with hypertension.  Louis GrandchildAvery Louis [name] denies chest pain or shortness of breath on exertion. He is working weight loss to help control his blood pressure with the goal of decreasing his risk of heart attack and stroke. He is taking Losartan. Leanard's blood pressure is currently controlled. He reports rushing to get here.  At risk for cardiovascular disease Louis [name] is at a higher than average risk for cardiovascular disease due to obesity. He currently denies any chest pain.   ASSESSMENT AND PLAN:  Prediabetes - Plan: Semaglutide (RYBELSUS) 3 MG TABS  Essential hypertension  At risk for heart disease  Class 3 severe obesity with serious comorbidity and body mass index (BMI) of 50.0 to 59.9 in adult, unspecified obesity type (HCC) - Plan: Insulin Pen Needle (BD PEN NEEDLE NANO 2ND GEN) 32G X 4 MM  MISC  PLAN:  Pre-Diabetes Louis [name] will continue to work on weight loss, exercise, and decreasing simple carbohydrates in his diet to help decrease the risk of diabetes. Louis [name] agrees to start taking Rybelsus 3 mg qam 30 minutes prior to eating #30 with no refills after he is finished with Saxenda. A prescription was written for needles #100 with no refills. He was informed that eating too many simple carbohydrates or too many calories at one sitting increases the likelihood of GI side effects. Louis [name] agrees to follow up with our clinic in 3 weeks.  Hypertension We discussed sodium restriction, working on healthy weight loss, and a regular exercise program as the means to achieve improved blood pressure control. We will continue to monitor his blood pressure as well as his progress with the above lifestyle modifications. Louis [name] agrees to continue with weight loss and taking his medications. He will watch for signs of hypotension as he continues his lifestyle modifications. Louis [name] agrees to follow up with our clinic in 3 weeks.  Cardiovascular risk counseling Louis [name] was given extended (15 minutes) coronary artery disease prevention counseling today. He is 43 y.o. male and has risk factors for heart disease including obesity. We discussed intensive lifestyle modifications today with an emphasis on specific weight loss instructions and strategies. Pt was also informed of the importance of increasing exercise and decreasing saturated fats to help prevent heart disease.  Obesity Louis [name] is currently in the action stage of change. As such, his goal is  to continue with weight loss efforts He has agreed to follow the Category 3 plan Norrin has been instructed to work up to a goal of 150 minutes of combined cardio and strengthening exercise per week for weight loss and overall health benefits. We discussed the following Behavioral Modification Strategies today: work on meal planning and easy cooking plans  Astin has  agreed to follow up with our clinic in 3 weeks. He was informed of the importance of frequent follow up visits to maximize his success with intensive lifestyle modifications for his multiple health conditions.  ALLERGIES: Allergies  Allergen Reactions  . No Known Allergies     MEDICATIONS: Current Outpatient Medications on File Prior to Visit  Medication Sig Dispense Refill  . acetaminophen (TYLENOL) 500 MG tablet Take 1,000 mg by mouth every 6 (six) hours as needed (for pain.).    Marland Kitchen diclofenac (VOLTAREN) 75 MG EC tablet TAKE 1 TABLET (75 MG TOTAL) BY MOUTH 2 (TWO) TIMES DAILY. 60 tablet 0  . Liraglutide -Weight Management (SAXENDA) 18 MG/3ML SOPN Inject 1.8 mg into the skin daily at 12 noon. 5 pen 0  . losartan (COZAAR) 100 MG tablet TAKE 1 TABLET BY MOUTH EVERY DAY 30 tablet 2  . metFORMIN (GLUCOPHAGE) 500 MG tablet TAKE 1 TABLET BY MOUTH EVERY DAY WITH BREAKFAST 30 tablet 0  . Vitamin D, Ergocalciferol, (DRISDOL) 1.25 MG (50000 UT) CAPS capsule Take 1 capsule (50,000 Units total) by mouth every 7 (seven) days. 4 capsule 0   No current facility-administered medications on file prior to visit.     PAST MEDICAL HISTORY: Past Medical History:  Diagnosis Date  . Allergy   . Arthritis    in both knees  . GERD (gastroesophageal reflux disease)   . Headache   . HTN (hypertension)   . Obesity     PAST SURGICAL HISTORY: Past Surgical History:  Procedure Laterality Date  . ACHILLES TENDON REPAIR    . ANTERIOR CRUCIATE LIGAMENT REPAIR    . LUMBAR LAMINECTOMY/DECOMPRESSION MICRODISCECTOMY Right 09/19/2016   Procedure: Right Lumbar Five-Sacral one Laminotomy/Foraminotomy/removal of Synovial cyst;  Surgeon: [name], MD;  Location: [facility] OR;  Service: Neurosurgery;  Laterality: Right;  Right L5-S1 Laminotomy/Foraminotomy/removal of Synovial cyst  . POSTERIOR CERVICAL LAMINECTOMY N/A 06/12/2016   Procedure: Cervical One Posterior cervical laminectomy;  Surgeon: [name], MD;   Location: [facility] OR;  Service: Neurosurgery;  Laterality: N/A;  posterior  . TONSILLECTOMY      SOCIAL HISTORY: Social History   Tobacco Use  . Smoking status: Former Smoker    Packs/day: 0.25    Years: 3.00    Pack years: 0.75    Types: Cigarettes    Last attempt to quit: 06/14/2016    Years since quitting: 1.6  . Smokeless tobacco: Never Used  Substance Use Topics  . 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  . Drug use: No    FAMILY HISTORY: Family History  Problem Relation Age of Onset  . Diabetes Father   . Obesity Father     ROS: Review of Systems  Constitutional: Positive for weight loss.  Cardiovascular: Negative for chest pain.  Gastrointestinal: Negative for diarrhea, nausea and vomiting.  Genitourinary:       Negative for polyuria  Endo/Heme/Allergies:       Negative for hypoglycemia Negative for polyphagia    PHYSICAL  EXAM: Blood pressure (!) 157/71, pulse 79, temperature 97.7 F (36.5 C), temperature source Oral, height 6' (1.829 m), weight (!) 396 lb (179.6 kg), SpO2 99 %. Body mass index is 53.71 kg/m. Physical Exam Vitals signs reviewed.  Constitutional:      Appearance: Normal appearance. He is obese.  Cardiovascular:     Rate and Rhythm: Normal rate.  Pulmonary:     Effort: Pulmonary effort is normal.  Musculoskeletal: Normal range of motion.  Skin:    General: Skin is warm and dry.  Neurological:     Mental Status: He is alert and oriented to person, place, and time.  Psychiatric:        Mood and Affect: Mood normal.     RECENT LABS AND TESTS: BMET    Component Value Date/Time   NA 141 10/30/2017 0803   K 4.3 10/30/2017 0803   CL 101 10/30/2017 0803   CO2 26 10/30/2017 0803   GLUCOSE 90 10/30/2017 0803   GLUCOSE 98 07/16/2017 0911   BUN 9 10/30/2017 0803   CREATININE 0.98 10/30/2017 0803   CREATININE 0.94 11/15/2015 0833   CALCIUM  9.0 10/30/2017 0803   GFRNONAA 95 10/30/2017 0803   GFRNONAA >89 11/15/2015 0833   GFRAA 109 10/30/2017 0803   GFRAA >89 11/15/2015 0833   Lab Results  Component Value Date   HGBA1C 5.7 (H) 10/30/2017   HGBA1C 5.9 07/16/2017   HGBA1C 5.9 (H) 03/08/2017   HGBA1C 5.8 11/16/2015   Lab Results  Component Value Date   INSULIN 15.6 10/30/2017   INSULIN 18.0 03/08/2017   CBC    Component Value Date/Time   WBC 8.6 03/08/2017 0932   WBC 8.8 09/14/2016 1140   RBC 4.63 03/08/2017 0932   RBC 4.89 09/14/2016 1140   HGB 13.2 03/08/2017 0932   HCT 41.5 03/08/2017 0932   PLT 285 03/08/2017 0932   MCV 90 03/08/2017 0932   MCH 28.5 03/08/2017 0932   MCH 27.8 09/14/2016 1140   MCHC 31.8 03/08/2017 0932   MCHC 31.8 09/14/2016 1140   RDW 14.7 03/08/2017 0932   LYMPHSABS 2.4 03/08/2017 0932   MONOABS 0.4 11/15/2015 0833   EOSABS 0.1 03/08/2017 0932   BASOSABS 0.0 03/08/2017 0932   Iron/TIBC/Ferritin/ %Sat No results found for: IRON, TIBC, FERRITIN, IRONPCTSAT Lipid Panel     Component Value Date/Time   CHOL 140 10/30/2017 0803   TRIG 54 10/30/2017 0803   HDL 43 10/30/2017 0803   CHOLHDL 3 07/16/2017 0911   VLDL 13.0 07/16/2017 0911   LDLCALC 86 10/30/2017 0803   Hepatic Function Panel     Component Value Date/Time   PROT 7.6 10/30/2017 0803   ALBUMIN 4.1 10/30/2017 0803   AST 21 10/30/2017 0803   ALT 22 10/30/2017 0803   ALKPHOS 90 10/30/2017 0803   BILITOT 0.3 10/30/2017 0803      Component Value Date/Time   TSH 1.260 03/08/2017 0932   TSH 1.07 11/15/2015 0833      OBESITY BEHAVIORAL INTERVENTION VISIT  Today's visit was # 16   Starting weight: 406 lbs Starting date: 03/08/2017 Today's weight : 396 lbs Today's date: 01/31/2018 Total lbs lost to date: 10   ASK: We discussed the diagnosis of obesity with Louis GrandchildAvery Louis Abed today and Louis PeonAvery agreed to give us permission to discuss obesity behavioral modification therapy today.  ASSESS: Louis [name] has the diagnosis of  obesity and his BMI today is 53.7 Louis [name] is in the action stage of change   ADVISE: Louis PeonAvery  was educated on the multiple health risks of obesity as well as the benefit of weight loss to improve his health. He was advised of the need for long term treatment and the importance of lifestyle modifications to improve his current health and to decrease his risk of future health problems.  AGREE: Multiple dietary modification options and treatment options were discussed and  Pierce agreed to follow the recommendations documented in the above note.  ARRANGE: Daily was educated on the importance of frequent visits to treat obesity as outlined per CMS and USPSTF guidelines and agreed to schedule his next follow up appointment today.  I, Tammy Wysor, am acting as Energy manager for [employer] I, Alois Cliche, PA-C have reviewed above note and agree with its content

## 2018-01-31 NOTE — Telephone Encounter (Signed)
Pt bp is high and have been reviewing weight loss clinic notes. Want him to follow up with me. If you would call and see if he will come in.

## 2018-01-31 NOTE — Telephone Encounter (Signed)
Called pt and schedule pt for fu appt on Feb 07, 2018 at 11:20 with PCP. Done

## 2018-02-07 ENCOUNTER — Ambulatory Visit (INDEPENDENT_AMBULATORY_CARE_PROVIDER_SITE_OTHER): Payer: Managed Care, Other (non HMO) | Admitting: Medical

## 2018-02-07 ENCOUNTER — Encounter: Payer: Self-pay | Admitting: Medical

## 2018-02-07 VITALS — BP 145/70 | HR 64 | Temp 98.0°F | Resp 14 | Ht 72.0 in | Wt >= 6400 oz

## 2018-02-07 DIAGNOSIS — R7303 Prediabetes: Secondary | ICD-10-CM | POA: Diagnosis not present

## 2018-02-07 DIAGNOSIS — I10 Essential (primary) hypertension: Secondary | ICD-10-CM

## 2018-02-07 DIAGNOSIS — Z6841 Body Mass Index (BMI) 40.0 and over, adult: Secondary | ICD-10-CM

## 2018-02-07 DIAGNOSIS — R739 Hyperglycemia, unspecified: Secondary | ICD-10-CM

## 2018-02-07 NOTE — Progress Notes (Signed)
Subjective:    Patient ID: Louis [name], male    DOB: 06/03/1975, 43 y.o.   MRN: [id number]  HPI  Pt in for follow up.  He has history of high blood pressure. He is on cozaar. Pt bp initially high here. Last week bp high at weight loss clinic but he was in extreme hurry to get in. Pt bp recenlty were controlled oct, nov, and dec. Last 2-3 months slight bp increase. A lot of stress at work.  Pt states last week his weight was 392 lb. Some weight gain. No pedal edema. No shortness of breath.   Pt had history of neck pain and had surgery for this. He states neck pain is lower level and rare.  Pt last a1c done in October was in prediabetic. He is on saxenda and is on metformin. Rx'd by weight loss clinic.  Pt lipid panels has was good 3 months ago.    Review of Systems  Constitutional: Negative for chills, fatigue and fever.  HENT: Negative for congestion and drooling.   Respiratory: Negative for cough, chest tightness, shortness of breath and wheezing.   Cardiovascular: Negative for chest pain and palpitations.  Gastrointestinal: Negative for abdominal pain, blood in stool, diarrhea and nausea.  Genitourinary: Negative for dysuria.  Musculoskeletal: Negative for back pain and myalgias.  Skin: Negative for rash.  Neurological: Negative for dizziness, seizures, syncope, weakness and headaches.  Hematological: Negative for adenopathy. Does not bruise/bleed easily.  Psychiatric/Behavioral: Negative for behavioral problems and confusion.    Past Medical History:  Diagnosis Date  . Allergy   . Arthritis    in both knees  . GERD (gastroesophageal reflux disease)   . Headache   . HTN (hypertension)   . Obesity      Social History   Socioeconomic History  . Marital status: Married    Spouse name: Louis [name]  . Number of children: 7  . Years of education: Not on file  . Highest education level: Not on file  Occupational History  . Occupation: Corporate investment banker    Social Needs  . Financial resource strain: Not on file  . Food insecurity:    Worry: Not on file    Inability: Not on file  . Transportation needs:    Medical: Not on file    Non-medical: Not on file  Tobacco Use  . Smoking status: Former Smoker    Packs/day: 0.25    Years: 3.00    Pack years: 0.75    Types: Cigarettes    Last attempt to quit: 06/14/2016    Years since quitting: 1.6  . Smokeless tobacco: Never Used  Substance and Sexual Activity  . 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  . Drug use: No  . Sexual activity: Yes  Lifestyle  . Physical activity:    Days per week: Not on file    Minutes per session: Not on file  . Stress: Not on file  Relationships  . Social connections:    Talks on phone: Not on file    Gets together: Not on file    Attends religious service: Not on file    Active member of club or organization: Not on file    Attends meetings of clubs or organizations: Not on file    Relationship status: Not on file  . Intimate  partner violence:    Fear of current or ex partner: Not on file    Emotionally abused: Not on file    Physically abused: Not on file    Forced sexual activity: Not on file  Other Topics Concern  . Not on file  Social History Narrative  . Not on file    Past Surgical History:  Procedure Laterality Date  . ACHILLES TENDON REPAIR    . ANTERIOR CRUCIATE LIGAMENT REPAIR    . LUMBAR LAMINECTOMY/DECOMPRESSION MICRODISCECTOMY Right 09/19/2016   Procedure: Right Lumbar Five-Sacral one Laminotomy/Foraminotomy/removal of Synovial cyst;  Surgeon: Barnett AbuElsner, Henry, MD;  [facility];  Service: Neurosurgery;  Laterality: Right;  Right L5-S1 Laminotomy/Foraminotomy/removal of Synovial cyst  . POSTERIOR CERVICAL LAMINECTOMY N/A 06/12/2016   Procedure: Cervical One Posterior cervical laminectomy;  Surgeon: Barnett AbuElsner, Henry, MD;  [facility];   Service: Neurosurgery;  Laterality: N/A;  posterior  . TONSILLECTOMY      Family History  Problem Relation Age of Onset  . Diabetes Father   . Obesity Father     Allergies  Allergen Reactions  . No Known Allergies     Current Outpatient Medications on File Prior to Visit  Medication Sig Dispense Refill  . acetaminophen (TYLENOL) 500 MG tablet Take 1,000 mg by mouth every 6 (six) hours as needed (for pain.).    Marland Kitchen. diclofenac (VOLTAREN) 75 MG EC tablet TAKE 1 TABLET (75 MG TOTAL) BY MOUTH 2 (TWO) TIMES DAILY. 60 tablet 0  . Insulin Pen Needle (BD PEN NEEDLE NANO 2ND GEN) 32G X 4 MM MISC 1 Package by Does not apply route 2 (two) times daily. 100 each 0  . Liraglutide -Weight Management (SAXENDA) 18 MG/3ML SOPN Inject 1.8 mg into the skin daily at 12 noon. 5 pen 0  . losartan (COZAAR) 100 MG tablet TAKE 1 TABLET BY MOUTH EVERY DAY 30 tablet 2  . metFORMIN (GLUCOPHAGE) 500 MG tablet TAKE 1 TABLET BY MOUTH EVERY DAY WITH BREAKFAST 30 tablet 0  . Semaglutide (RYBELSUS) 3 MG TABS Take 3 mg by mouth daily. 30 tablet 0  . Vitamin D, Ergocalciferol, (DRISDOL) 1.25 MG (50000 UT) CAPS capsule Take 1 capsule (50,000 Units total) by mouth every 7 (seven) days. 4 capsule 0   No current facility-administered medications on file prior to visit.     BP (!) 163/74 (BP Location: Left Arm, Patient Position: Sitting, Cuff Size: Large)   Pulse 64   Temp 98 F (36.7 C)   Resp 14   Ht 6' (1.829 m)   Wt (!) 408 lb (185.1 kg)   SpO2 100%   BMI 55.33 kg/m       Objective:   Physical Exam  General Mental Status- Alert. General Appearance- Not in acute distress.   Skin General: Color- Normal Color. Moisture- Normal Moisture.  Neck Carotid Arteries- Normal color. Moisture- Normal Moisture. No carotid bruits. No JVD.  Chest and Lung Exam Auscultation: Breath Sounds:-Normal.  Cardiovascular Auscultation:Rythm- Regular. Murmurs & Other Heart Sounds:Auscultation of the heart reveals- No  Murmurs.  Abdomen Inspection:-Inspeection Normal. Palpation/Percussion:Note:No mass. Palpation and Percussion of the abdomen reveal- Non Tender, Non Distended + BS, no rebound or guarding.   Neurologic Cranial Nerve exam:- CN III-XII intact(No nystagmus), symmetric smile. Strength:- 5/5 equal and symmetric strength both upper and lower extremities.      Assessment & Plan:  Your bp is little high on recheck today. Last 2-3 months higher when you had check ups with wt loss clinic. Check bp  at home 3-4 times in a week. My chart me results. Might add amlodipine to your losartan.  For prediabetes continue saxenda and low dose metformin.(used for weight loss as well).  For low vit d, continue vit d prescription.  Flu vaccine declined. If you get flu like syndrome please be seen within 48 hours.  Counsel to try to stop smoking completely. Wellbutrin med is option.   Follow up in 6 months or as needed

## 2018-02-07 NOTE — Patient Instructions (Addendum)
Your bp is little high on recheck today. Last 2-3 months higher when you had check ups with wt loss clinic. Check bp at home 3-4 times in a week. My chart me results. Might add amlodipine to your losartan.  For prediabetes continue saxenda and low dose metformin.(used for weight loss as well).  For low vit d, continue vit d prescription.  Flu vaccine declined. If you get flu like syndrome please be seen within 48 hours.  Counsel to try to stop smoking completely. Wellbutrin med is option.  Follow up in 6 months or as needed

## 2018-02-12 ENCOUNTER — Other Ambulatory Visit (INDEPENDENT_AMBULATORY_CARE_PROVIDER_SITE_OTHER): Payer: Self-pay | Admitting: Physician Assistant

## 2018-02-12 ENCOUNTER — Other Ambulatory Visit (INDEPENDENT_AMBULATORY_CARE_PROVIDER_SITE_OTHER): Payer: Self-pay

## 2018-02-12 DIAGNOSIS — R7303 Prediabetes: Secondary | ICD-10-CM

## 2018-02-12 DIAGNOSIS — E559 Vitamin D deficiency, unspecified: Secondary | ICD-10-CM

## 2018-02-12 MED ORDER — VITAMIN D (ERGOCALCIFEROL) 1.25 MG (50000 UNIT) PO CAPS: 50000.0000 [IU] | ORAL_CAPSULE | ORAL | 0 refills | Status: DC

## 2018-02-20 ENCOUNTER — Ambulatory Visit (INDEPENDENT_AMBULATORY_CARE_PROVIDER_SITE_OTHER): Payer: Managed Care, Other (non HMO) | Admitting: Physician Assistant

## 2018-02-20 ENCOUNTER — Encounter (INDEPENDENT_AMBULATORY_CARE_PROVIDER_SITE_OTHER): Payer: Self-pay | Admitting: Physician Assistant

## 2018-02-20 VITALS — BP 124/80 | HR 84 | Temp 98.4°F | Ht 72.0 in | Wt 398.0 lb

## 2018-02-20 DIAGNOSIS — Z6841 Body Mass Index (BMI) 40.0 and over, adult: Secondary | ICD-10-CM | POA: Diagnosis not present

## 2018-02-20 DIAGNOSIS — I10 Essential (primary) hypertension: Secondary | ICD-10-CM

## 2018-02-21 NOTE — Progress Notes (Signed)
Office: [phone]  /  Fax: [phone]   HPI:   Chief Complaint: OBESITY Louis [name] is here to discuss his progress with his obesity treatment plan. He is on the modified Category 3 plan or follow the Category 4 plan and is following his eating plan approximately 90 % of the time. He states he is exercising 0 minutes 0 times per week. Louis [name] reports that due to stress at work, he has been sometimes skipping lunch and under eating his protein at dinner. He just started his Rybelsus 4 days ago and does not notice a change in hunger. His weight is (!) 398 lb (180.5 kg) today and has gained 2 pounds since his last visit. He has lost 8 lbs since starting treatment with [name].  Hypertension Louis [name] is a 44 y.o. male with hypertension. Louis [name]'s blood pressure is controlled. He is on losartan and denies chest pain. He is working on weight loss to help control his blood pressure with the goal of decreasing his risk of heart attack and stroke.  ALLERGIES: Allergies  Allergen Reactions  . No Known Allergies     MEDICATIONS: Current Outpatient Medications on File Prior to Visit  Medication Sig Dispense Refill  . acetaminophen (TYLENOL) 500 MG tablet Take 1,000 mg by mouth every 6 (six) hours as needed (for pain.).    Marland Kitchen diclofenac (VOLTAREN) 75 MG EC tablet TAKE 1 TABLET (75 MG TOTAL) BY MOUTH 2 (TWO) TIMES DAILY. 60 tablet 0  . Insulin Pen Needle (BD PEN NEEDLE NANO 2ND GEN) 32G X 4 MM MISC 1 Package by Does not apply route 2 (two) times daily. 100 each 0  . losartan (COZAAR) 100 MG tablet TAKE 1 TABLET BY MOUTH EVERY DAY 30 tablet 2  . metFORMIN (GLUCOPHAGE) 500 MG tablet TAKE 1 TABLET BY MOUTH EVERY DAY WITH BREAKFAST 30 tablet 0  . Semaglutide (RYBELSUS) 3 MG TABS Take 3 mg by mouth daily. 30 tablet 0  . Vitamin D, Ergocalciferol, (DRISDOL) 1.25 MG (50000 UT) CAPS capsule Take 1 capsule (50,000 Units total) by mouth every 7 (seven) days. 4 capsule 0   No current facility-administered  medications on file prior to visit.     PAST MEDICAL HISTORY: Past Medical History:  Diagnosis Date  . Allergy   . Arthritis    in both knees  . GERD (gastroesophageal reflux disease)   . Headache   . HTN (hypertension)   . Obesity     PAST SURGICAL HISTORY: Past Surgical History:  Procedure Laterality Date  . ACHILLES TENDON REPAIR    . ANTERIOR CRUCIATE LIGAMENT REPAIR    . LUMBAR LAMINECTOMY/DECOMPRESSION MICRODISCECTOMY Right 09/19/2016   Procedure: Right Lumbar Five-Sacral one Laminotomy/Foraminotomy/removal of Synovial cyst;  Surgeon: [name], MD;  Location: [facility] OR;  Service: Neurosurgery;  Laterality: Right;  Right L5-S1 Laminotomy/Foraminotomy/removal of Synovial cyst  . POSTERIOR CERVICAL LAMINECTOMY N/A 06/12/2016   Procedure: Cervical One Posterior cervical laminectomy;  Surgeon: [name], MD;  Location: [facility] OR;  Service: Neurosurgery;  Laterality: N/A;  posterior  . TONSILLECTOMY      SOCIAL HISTORY: Social History   Tobacco Use  . Smoking status: Former Smoker    Packs/day: 0.25    Years: 3.00    Pack years: 0.75    Types: Cigarettes    Last attempt to quit: 06/14/2016    Years since quitting: 1.6  . Smokeless tobacco: Never Used  Substance Use Topics  . 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  . Drug use: No    FAMILY HISTORY: Family History  Problem Relation Age of Onset  . Diabetes Father   . Obesity Father     ROS: Review of Systems  Constitutional: Negative for weight loss.  Cardiovascular: Negative for chest pain.    PHYSICAL EXAM: Blood pressure 124/80, pulse 84, temperature 98.4 F (36.9 C), height 6' (1.829 m), weight (!) 398 lb (180.5 kg), SpO2 96 %. Body mass index is 53.98 kg/m. Physical Exam Vitals signs reviewed.  Constitutional:      Appearance: Normal appearance. He is obese.  Cardiovascular:     Rate and Rhythm:  Normal rate.     Pulses: Normal pulses.  Pulmonary:     Effort: Pulmonary effort is normal.     Breath sounds: Normal breath sounds.  Musculoskeletal: Normal range of motion.  Skin:    General: Skin is warm and dry.  Neurological:     Mental Status: He is alert and oriented to person, place, and time.  Psychiatric:        Mood and Affect: Mood normal.        Behavior: Behavior normal.     RECENT LABS AND TESTS: BMET    Component Value Date/Time   NA 141 10/30/2017 0803   K 4.3 10/30/2017 0803   CL 101 10/30/2017 0803   CO2 26 10/30/2017 0803   GLUCOSE 90 10/30/2017 0803   GLUCOSE 98 07/16/2017 0911   BUN 9 10/30/2017 0803   CREATININE 0.98 10/30/2017 0803   CREATININE 0.94 11/15/2015 0833   CALCIUM 9.0 10/30/2017 0803   GFRNONAA 95 10/30/2017 0803   GFRNONAA >89 11/15/2015 0833   GFRAA 109 10/30/2017 0803   GFRAA >89 11/15/2015 0833   Lab Results  Component Value Date   HGBA1C 5.7 (H) 10/30/2017   HGBA1C 5.9 07/16/2017   HGBA1C 5.9 (H) 03/08/2017   HGBA1C 5.8 11/16/2015   Lab Results  Component Value Date   INSULIN 15.6 10/30/2017   INSULIN 18.0 03/08/2017   CBC    Component Value Date/Time   WBC 8.6 03/08/2017 0932   WBC 8.8 09/14/2016 1140   RBC 4.63 03/08/2017 0932   RBC 4.89 09/14/2016 1140   HGB 13.2 03/08/2017 0932   HCT 41.5 03/08/2017 0932   PLT 285 03/08/2017 0932   MCV 90 03/08/2017 0932   MCH 28.5 03/08/2017 0932   MCH 27.8 09/14/2016 1140   MCHC 31.8 03/08/2017 0932   MCHC 31.8 09/14/2016 1140   RDW 14.7 03/08/2017 0932   LYMPHSABS 2.4 03/08/2017 0932   MONOABS 0.4 11/15/2015 0833   EOSABS 0.1 03/08/2017 0932   BASOSABS 0.0 03/08/2017 0932   Iron/TIBC/Ferritin/ %Sat No results found for: IRON, TIBC, FERRITIN, IRONPCTSAT Lipid Panel     Component Value Date/Time   CHOL 140 10/30/2017 0803   TRIG 54 10/30/2017 0803   HDL 43 10/30/2017 0803   CHOLHDL 3 07/16/2017 0911   VLDL 13.0 07/16/2017 0911   LDLCALC 86 10/30/2017 0803    Hepatic Function Panel     Component Value Date/Time   PROT 7.6 10/30/2017 0803   ALBUMIN 4.1 10/30/2017 0803   AST 21 10/30/2017 0803   ALT 22 10/30/2017 0803   ALKPHOS 90 10/30/2017 0803   BILITOT 0.3 10/30/2017 0803      Component Value Date/Time   TSH 1.260 03/08/2017 0932   TSH 1.07 11/15/2015 6203  ASSESSMENT AND PLAN: Essential hypertension  Class 3 severe obesity with serious comorbidity and body mass index (BMI) of 50.0 to 59.9 in adult, unspecified obesity type (HCC)  PLAN:  Hypertension We discussed sodium restriction, working on healthy weight loss, and a regular exercise program as the means to achieve improved blood pressure control. Louis [name] agreed with this plan and agreed to follow up as directed. We will continue to monitor his blood pressure as well as his progress with the above lifestyle modifications. Louis [name] will continue his medications and weight loss, and will watch for signs of hypotension as he continues his lifestyle modifications. Louis [name] agrees to follow up with our clinic in 2 weeks.  I spent > than 50% of the 15 minute visit on counseling as documented in the note.  Obesity Louis [name] is currently in the action stage of change. As such, his goal is to continue with weight loss efforts He has agreed to follow the modified Category 3 plan or follow the modified Category 4 plan Louis [name] has been instructed to work up to a goal of 150 minutes of combined cardio and strengthening exercise per week for weight loss and overall health benefits. We discussed the following Behavioral Modification Strategies today: work on meal planning and easy cooking plans and no skipping meals   Louis [name] has agreed to follow up with our clinic in 2 weeks. He was informed of the importance of frequent follow up visits to maximize his success with intensive lifestyle modifications for his multiple health conditions.   OBESITY BEHAVIORAL INTERVENTION VISIT  Today's visit was # 18    Starting weight: 406 lbs Starting date: 03/08/17 Today's weight : 398 lbs  Today's date: 02/20/2018 Total lbs lost to date: 8    ASK: We discussed the diagnosis of obesity with Louis [name] today and Louis [name] agreed to give Louis [name] permission to discuss obesity behavioral modification therapy today.  ASSESS: Louis [name] has the diagnosis of obesity and his BMI today is 53.97 Louis [name] is in the action stage of change   ADVISE: Louis [name] was educated on the multiple health risks of obesity as well as the benefit of weight loss to improve his health. He was advised of the need for long term treatment and the importance of lifestyle modifications.  AGREE: Multiple dietary modification options and treatment options were discussed and  Louis [name] agreed to the above obesity treatment plan.  Louis McburneyI, Louis [name], am acting as transcriptionist for Louis Clicheracey Katelind Pytel, Louis [name] I, Louis Clicheracey Dustie Brittle, Louis [name] have reviewed above note and agree with its content

## 2018-02-23 ENCOUNTER — Other Ambulatory Visit: Payer: Self-pay | Admitting: Medical

## 2018-03-06 ENCOUNTER — Ambulatory Visit (INDEPENDENT_AMBULATORY_CARE_PROVIDER_SITE_OTHER): Payer: Managed Care, Other (non HMO) | Admitting: Physician Assistant

## 2018-03-06 VITALS — BP 133/79 | HR 83 | Temp 98.0°F | Ht 72.0 in | Wt 395.0 lb

## 2018-03-06 DIAGNOSIS — Z9189 Other specified personal risk factors, not elsewhere classified: Secondary | ICD-10-CM | POA: Diagnosis not present

## 2018-03-06 DIAGNOSIS — Z6841 Body Mass Index (BMI) 40.0 and over, adult: Secondary | ICD-10-CM

## 2018-03-06 DIAGNOSIS — E559 Vitamin D deficiency, unspecified: Secondary | ICD-10-CM | POA: Diagnosis not present

## 2018-03-06 DIAGNOSIS — R7303 Prediabetes: Secondary | ICD-10-CM | POA: Diagnosis not present

## 2018-03-06 MED ORDER — VITAMIN D (ERGOCALCIFEROL) 1.25 MG (50000 UNIT) PO CAPS: 50000.0000 [IU] | ORAL_CAPSULE | ORAL | 0 refills | Status: DC

## 2018-03-07 NOTE — Progress Notes (Signed)
Office: [phone]  /  Fax: [phone]   HPI:   Chief Complaint: OBESITY Daquin is here to discuss his progress with his obesity treatment plan. He is on the Category 3/mod Category 4 plan and is following his eating plan approximately 90% of the time. He states he is working 10 minutes 7 times per week. Jerryl did well with weight loss. He reports that he has stopped skipping lunch and is eating all of his dinner. He denies hunger. His weight is (!) 395 lb (179.2 kg) today and has had a weight loss of 3 pounds over a period of 2 weeks since his last visit. He has lost 11 lbs since starting treatment with [name].  Vitamin D deficiency Daquan has a diagnosis of Vitamin D deficiency. He is currently taking prescription Vit D and denies nausea, vomiting or muscle weakness.  At risk for osteopenia and osteoporosis Amman is at higher risk of osteopenia and osteoporosis due to Vitamin D deficiency.   Pre-Diabetes Teoman has a diagnosis of prediabetes based on his elevated Hgb A1c and was informed this puts him at greater risk of developing diabetes. He is taking Rybelsus currently and continues to work on diet and exercise to decrease risk of diabetes. He denies nausea, vomiting, diarrhea, or polyphagia.   ASSESSMENT AND PLAN:  Vitamin D deficiency - Plan: Vitamin D, Ergocalciferol, (DRISDOL) 1.25 MG (50000 UT) CAPS capsule  Prediabetes  At risk for osteoporosis  Class 3 severe obesity with serious comorbidity and body mass index (BMI) of 50.0 to 59.9 in adult, unspecified obesity type (HCC)  PLAN:  Vitamin D Deficiency Diron was informed that low Vitamin D levels contributes to fatigue and are associated with obesity, breast, and colon cancer. He agrees to continue to take prescription Vit D @ 50,000 IU every week #4 with 0 refills and will follow-up for routine testing of Vitamin D, at least 2-3 times per year. He was informed of the risk of over-replacement of Vitamin D and agrees to not  increase her dose unless she discusses this with [name] first. Shin agrees to follow-up with our clinic in 3 weeks.  At risk for osteopenia and osteoporosis Honor was given extended  (15 minutes) osteoporosis prevention counseling today. Eliazer is at risk for osteopenia and osteoporsis due to his Vitamin D deficiency. He was encouraged to take his Vitamin D and follow his higher calcium diet and increase strengthening exercise to help strengthen his bones and decrease his risk of osteopenia and osteoporosis.  Pre-Diabetes Riyadh will continue to work on weight loss, exercise, and decreasing simple carbohydrates in his diet to help decrease the risk of diabetes. We dicussed metformin including benefits and risks. He was informed that eating too many simple carbohydrates or too many calories at one sitting increases the likelihood of GI side effects. Davonne is on Rybelsus for now and a prescription was not written today. Monti agreed to follow-up with [name] as directed to monitor his progress.  Obesity Aedric is currently in the action stage of change. As such, his goal is to continue with weight loss efforts. He has agreed to follow the Category 3 plan/Mod Category 4. Dagon has been instructed to work up to a goal of 150 minutes of combined cardio and strengthening exercise per week for weight loss and overall health benefits. We discussed the following Behavioral Modification Strategies today: work on meal planning and easy cooking plans and planning for success.  Barkon has agreed to follow-up with our clinic in  3 weeks. He was informed of the importance of frequent follow up visits to maximize his success with intensive lifestyle modifications for his multiple health conditions.  ALLERGIES: Allergies  Allergen Reactions  . No Known Allergies     MEDICATIONS: Current Outpatient Medications on File Prior to Visit  Medication Sig Dispense Refill  . acetaminophen (TYLENOL) 500 MG tablet Take 1,000 mg  by mouth every 6 (six) hours as needed (for pain.).    Marland Kitchen diclofenac (VOLTAREN) 75 MG EC tablet TAKE 1 TABLET BY MOUTH TWICE A DAY 60 tablet 0  . Insulin Pen Needle (BD PEN NEEDLE NANO 2ND GEN) 32G X 4 MM MISC 1 Package by Does not apply route 2 (two) times daily. 100 each 0  . losartan (COZAAR) 100 MG tablet TAKE 1 TABLET BY MOUTH EVERY DAY 30 tablet 2  . metFORMIN (GLUCOPHAGE) 500 MG tablet TAKE 1 TABLET BY MOUTH EVERY DAY WITH BREAKFAST 30 tablet 0  . Semaglutide (RYBELSUS) 3 MG TABS Take 3 mg by mouth daily. 30 tablet 0   No current facility-administered medications on file prior to visit.     PAST MEDICAL HISTORY: Past Medical History:  Diagnosis Date  . Allergy   . Arthritis    in both knees  . GERD (gastroesophageal reflux disease)   . Headache   . HTN (hypertension)   . Obesity     PAST SURGICAL HISTORY: Past Surgical History:  Procedure Laterality Date  . ACHILLES TENDON REPAIR    . ANTERIOR CRUCIATE LIGAMENT REPAIR    . LUMBAR LAMINECTOMY/DECOMPRESSION MICRODISCECTOMY Right 09/19/2016   Procedure: Right Lumbar Five-Sacral one Laminotomy/Foraminotomy/removal of Synovial cyst;  Surgeon: [name], MD;  Location: [facility] OR;  Service: Neurosurgery;  Laterality: Right;  Right L5-S1 Laminotomy/Foraminotomy/removal of Synovial cyst  . POSTERIOR CERVICAL LAMINECTOMY N/A 06/12/2016   Procedure: Cervical One Posterior cervical laminectomy;  Surgeon: [name], MD;  Location: [facility] OR;  Service: Neurosurgery;  Laterality: N/A;  posterior  . TONSILLECTOMY      SOCIAL HISTORY: Social History   Tobacco Use  . Smoking status: Former Smoker    Packs/day: 0.25    Years: 3.00    Pack years: 0.75    Types: Cigarettes    Last attempt to quit: 06/14/2016    Years since quitting: 1.7  . Smokeless tobacco: Never Used  Substance Use Topics  . 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  .  Drug use: No    FAMILY HISTORY: Family History  Problem Relation Age of Onset  . Diabetes Father   . Obesity Father    ROS: Review of Systems  Constitutional: Positive for weight loss.  Gastrointestinal: Negative for diarrhea, nausea and vomiting.  Musculoskeletal:       Negative for muscle weakness.  Endo/Heme/Allergies:       Negative for polyphagia. Negative for hypoglycemia.   PHYSICAL EXAM: Blood pressure 133/79, pulse 83, temperature 98 F (36.7 C), temperature source Oral, height 6' (1.829 m), weight (!) 395 lb (179.2 kg), SpO2 96 %. Body mass index is 53.57 kg/m. Physical Exam Vitals signs reviewed.  Constitutional:      Appearance: Normal appearance. He is obese.  Cardiovascular:     Rate and Rhythm: Normal rate.     Pulses: Normal pulses.  Pulmonary:     Effort: Pulmonary effort is normal.  Breath sounds: Normal breath sounds.  Musculoskeletal: Normal range of motion.  Skin:    General: Skin is warm and dry.  Neurological:     Mental Status: He is alert and oriented to person, place, and time.  Psychiatric:        Behavior: Behavior normal.   RECENT LABS AND TESTS: BMET    Component Value Date/Time   NA 141 10/30/2017 0803   K 4.3 10/30/2017 0803   CL 101 10/30/2017 0803   CO2 26 10/30/2017 0803   GLUCOSE 90 10/30/2017 0803   GLUCOSE 98 07/16/2017 0911   BUN 9 10/30/2017 0803   CREATININE 0.98 10/30/2017 0803   CREATININE 0.94 11/15/2015 0833   CALCIUM 9.0 10/30/2017 0803   GFRNONAA 95 10/30/2017 0803   GFRNONAA >89 11/15/2015 0833   GFRAA 109 10/30/2017 0803   GFRAA >89 11/15/2015 0833   Lab Results  Component Value Date   HGBA1C 5.7 (H) 10/30/2017   HGBA1C 5.9 07/16/2017   HGBA1C 5.9 (H) 03/08/2017   HGBA1C 5.8 11/16/2015   Lab Results  Component Value Date   INSULIN 15.6 10/30/2017   INSULIN 18.0 03/08/2017   CBC    Component Value Date/Time   WBC 8.6 03/08/2017 0932   WBC 8.8 09/14/2016 1140   RBC 4.63 03/08/2017 0932    RBC 4.89 09/14/2016 1140   HGB 13.2 03/08/2017 0932   HCT 41.5 03/08/2017 0932   PLT 285 03/08/2017 0932   MCV 90 03/08/2017 0932   MCH 28.5 03/08/2017 0932   MCH 27.8 09/14/2016 1140   MCHC 31.8 03/08/2017 0932   MCHC 31.8 09/14/2016 1140   RDW 14.7 03/08/2017 0932   LYMPHSABS 2.4 03/08/2017 0932   MONOABS 0.4 11/15/2015 0833   EOSABS 0.1 03/08/2017 0932   BASOSABS 0.0 03/08/2017 0932   Iron/TIBC/Ferritin/ %Sat No results found for: IRON, TIBC, FERRITIN, IRONPCTSAT Lipid Panel     Component Value Date/Time   CHOL 140 10/30/2017 0803   TRIG 54 10/30/2017 0803   HDL 43 10/30/2017 0803   CHOLHDL 3 07/16/2017 0911   VLDL 13.0 07/16/2017 0911   LDLCALC 86 10/30/2017 0803   Hepatic Function Panel     Component Value Date/Time   PROT 7.6 10/30/2017 0803   ALBUMIN 4.1 10/30/2017 0803   AST 21 10/30/2017 0803   ALT 22 10/30/2017 0803   ALKPHOS 90 10/30/2017 0803   BILITOT 0.3 10/30/2017 0803      Component Value Date/Time   TSH 1.260 03/08/2017 0932   TSH 1.07 11/15/2015 0833    Ref. Range 10/30/2017 08:03  Vitamin D, 25-Hydroxy Latest Ref Range: 30.0 - 100.0 ng/mL 33.9   OBESITY BEHAVIORAL INTERVENTION VISIT  Today's visit was #19  Starting weight: 406 lbs Starting date: 03/08/2017 Today's weight: 395 lbs Today's date: 03/06/2018 Total lbs lost to date: 11    03/06/2018  Height 6' (1.829 m)  Weight 395 lb (179.2 kg) (A)  BMI (Calculated) 53.56  BLOOD PRESSURE - SYSTOLIC 133  BLOOD PRESSURE - DIASTOLIC 79   Body Fat % 45 %  Total Body Water (lbs) 159.4 lbs   ASK: We discussed the diagnosis of obesity with [name] today and Denny Peon agreed to give [name] permission to discuss obesity behavioral modification therapy today.  ASSESS: Keilon has the diagnosis of obesity and his BMI today is 53.56. Vikash is in the action stage of change.   ADVISE: Ayden was educated on the multiple health risks of obesity as well as the benefit of  weight loss to improve his  health. He was advised of the need for long term treatment and the importance of lifestyle modifications to improve his current health and to decrease his risk of future health problems.  AGREE: Multiple dietary modification options and treatment options were discussed and  [name] agreed to follow the recommendations documented in the above note.  ARRANGE: [name] was educated on the importance of frequent visits to treat obesity as outlined per CMS and USPSTF guidelines and agreed to schedule his next follow up appointment today.  Fernanda DrumI, Denise Haag, am acting as transcriptionist for Alois Clicheracey Abdulaziz Toman, PA-C I, Alois Clicheracey Jaleyah Longhi, PA-C have reviewed above note and agree with its content

## 2018-03-10 ENCOUNTER — Other Ambulatory Visit (INDEPENDENT_AMBULATORY_CARE_PROVIDER_SITE_OTHER): Payer: Self-pay | Admitting: Physician Assistant

## 2018-03-10 DIAGNOSIS — R7303 Prediabetes: Secondary | ICD-10-CM

## 2018-03-11 ENCOUNTER — Other Ambulatory Visit (INDEPENDENT_AMBULATORY_CARE_PROVIDER_SITE_OTHER): Payer: Self-pay

## 2018-03-11 DIAGNOSIS — E559 Vitamin D deficiency, unspecified: Secondary | ICD-10-CM

## 2018-03-11 MED ORDER — VITAMIN D (ERGOCALCIFEROL) 1.25 MG (50000 UNIT) PO CAPS: 50000.0000 [IU] | ORAL_CAPSULE | ORAL | 0 refills | Status: DC

## 2018-03-18 ENCOUNTER — Ambulatory Visit: Payer: Managed Care, Other (non HMO) | Admitting: Neurology

## 2018-03-23 ENCOUNTER — Other Ambulatory Visit: Payer: Self-pay | Admitting: Medical

## 2018-03-28 ENCOUNTER — Encounter (INDEPENDENT_AMBULATORY_CARE_PROVIDER_SITE_OTHER): Payer: Self-pay

## 2018-03-28 ENCOUNTER — Ambulatory Visit (INDEPENDENT_AMBULATORY_CARE_PROVIDER_SITE_OTHER): Payer: Managed Care, Other (non HMO) | Admitting: Physician Assistant

## 2018-04-09 ENCOUNTER — Ambulatory Visit (INDEPENDENT_AMBULATORY_CARE_PROVIDER_SITE_OTHER): Payer: Managed Care, Other (non HMO) | Admitting: Physician Assistant

## 2018-04-09 ENCOUNTER — Other Ambulatory Visit: Payer: Self-pay

## 2018-04-09 DIAGNOSIS — Z6841 Body Mass Index (BMI) 40.0 and over, adult: Secondary | ICD-10-CM

## 2018-04-09 DIAGNOSIS — R7303 Prediabetes: Secondary | ICD-10-CM | POA: Diagnosis not present

## 2018-04-09 DIAGNOSIS — E559 Vitamin D deficiency, unspecified: Secondary | ICD-10-CM

## 2018-04-09 MED ORDER — SEMAGLUTIDE 7 MG PO TABS: 7.0000 mg | ORAL_TABLET | Freq: Every day | ORAL | 0 refills | Status: DC

## 2018-04-09 MED ORDER — METFORMIN HCL 500 MG PO TABS: ORAL_TABLET | ORAL | 0 refills | Status: DC

## 2018-04-10 NOTE — Progress Notes (Signed)
Office: [phone]  /  Fax: [phone] TeleHealth Visit:  [name] has verbally consented to this TeleHealth visit [phone]. The patient is located at home, the provider is located at the [facility] office. The participants in this visit include the listed provider and patient. The visit was conducted today via Skype.  HPI:   Chief Complaint: OBESITY Louis [name] is here to discuss his progress with his obesity treatment plan. He is on the Category 3/modified Category 4 plan and is following his eating plan approximately 90% of the time. He states he is walking 1 mile 2 times per week. Louis [name] reports that he believes he has lost 2 lbs. He is not weighing himself at home. He states he is following the plan closely. We were unable to weigh the patient today for this TeleHealth visit. He feels as if he has lost 2 lbs since his last visit. He has lost 11 lbs since starting treatment with us.  Pre-Diabetes Louis [name] has a diagnosis of prediabetes based on his elevated Hgb A1c and was informed this puts him at greater risk of developing diabetes. He is taking metformin and Rybelsus currently and continues to work on diet and exercise to decrease risk of diabetes. He denies nausea, vomiting, diarrhea, or polyphagia.  Vitamin D deficiency Louis [name] has a diagnosis of Vitamin D deficiency. He is currently taking Vit D and denies nausea, vomiting or muscle weakness.  ASSESSMENT AND PLAN:  Prediabetes - Plan: metFORMIN (GLUCOPHAGE) 500 MG tablet, Semaglutide (RYBELSUS) 7 MG TABS  Vitamin D deficiency  Class 3 severe obesity with serious comorbidity and body mass index (BMI) of 50.0 to 59.9 in adult, unspecified obesity type (HCC)  PLAN:  Pre-Diabetes Louis [name] will continue to work on weight loss, exercise, and decreasing simple carbohydrates in his diet to help decrease the risk of diabetes. We dicussed metformin including benefits and risks. He was informed that eating too many simple  carbohydrates or too many calories at one sitting increases the likelihood of GI side effects. Louis [name] is currently taking metformin and a refill prescription was written today for #30 with 0 refills. He was also given a refill on his Rybelsus 7 mg #30 with 0 refills. He agrees to follow-up with our clinic in 2 weeks.   Vitamin D Deficiency Louis [name] was informed that low Vitamin D levels contributes to fatigue and are associated with obesity, breast, and colon cancer. He agrees to continue taking Vit D and will follow-up for routine testing of Vitamin D, at least 2-3 times per year. He was informed of the risk of over-replacement of Vitamin D and agrees to not increase his dose unless he discusses this with us first. Louis [name] agrees to follow-up with our clinic in 2 weeks.  Obesity Louis [name] is currently in the action stage of change. As such, his goal is to continue with weight loss efforts. He has agreed to follow the Category 3/modified Category 4 plan. Louis [name] has been instructed to work up to a goal of 150 minutes of combined cardio and strengthening exercise per week for weight loss and overall health benefits. We discussed the following Behavioral Modification Strategies today: work on meal planning, easy cooking plans, and keeping healthy foods in the home.  Louis [name] has agreed to follow-up with our clinic in 2 weeks. He was informed of the importance of frequent follow-up visits to maximize his success with intensive lifestyle modifications for his multiple health conditions.  ALLERGIES: Allergies  Allergen Reactions  . No Known  Allergies     MEDICATIONS: Current Outpatient Medications on File Prior to Visit  Medication Sig Dispense Refill  . acetaminophen (TYLENOL) 500 MG tablet Take 1,000 mg by mouth every 6 (six) hours as needed (for pain.).    Marland Kitchen diclofenac (VOLTAREN) 75 MG EC tablet TAKE 1 TABLET BY MOUTH TWICE A DAY 60 tablet 0  . Insulin Pen Needle (BD PEN NEEDLE NANO 2ND GEN) 32G X 4 MM MISC  1 Package by Does not apply route 2 (two) times daily. 100 each 0  . losartan (COZAAR) 100 MG tablet TAKE 1 TABLET BY MOUTH EVERY DAY 30 tablet 2  . Vitamin D, Ergocalciferol, (DRISDOL) 1.25 MG (50000 UT) CAPS capsule Take 1 capsule (50,000 Units total) by mouth every 7 (seven) days. 4 capsule 0   No current facility-administered medications on file prior to visit.     PAST MEDICAL HISTORY: Past Medical History:  Diagnosis Date  . Allergy   . Arthritis    in both knees  . GERD (gastroesophageal reflux disease)   . Headache   . HTN (hypertension)   . Obesity     PAST SURGICAL HISTORY: Past Surgical History:  Procedure Laterality Date  . ACHILLES TENDON REPAIR    . ANTERIOR CRUCIATE LIGAMENT REPAIR    . LUMBAR LAMINECTOMY/DECOMPRESSION MICRODISCECTOMY Right 09/19/2016   Procedure: Right Lumbar Five-Sacral one Laminotomy/Foraminotomy/removal of Synovial cyst;  Surgeon: [name], MD;  Location: [facility];  Service: Neurosurgery;  Laterality: Right;  Right L5-S1 Laminotomy/Foraminotomy/removal of Synovial cyst  . POSTERIOR CERVICAL LAMINECTOMY N/A 06/12/2016   Procedure: Cervical One Posterior cervical laminectomy;  Surgeon: [name], MD;  Location: [facility] OR;  Service: Neurosurgery;  Laterality: N/A;  posterior  . TONSILLECTOMY      SOCIAL HISTORY: Social History   Tobacco Use  . Smoking status: Former Smoker    Packs/day: 0.25    Years: 3.00    Pack years: 0.75    Types: Cigarettes    Last attempt to quit: 06/14/2016    Years since quitting: 1.8  . Smokeless tobacco: Never Used  Substance Use Topics  . 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  . Drug use: No    FAMILY HISTORY: Family History  Problem Relation Age of Onset  . Diabetes Father   . Obesity Father    ROS: Review of Systems  Gastrointestinal: Negative for diarrhea, nausea and vomiting.  Musculoskeletal:        Negative for muscle weakness.  Endo/Heme/Allergies:       Negative for polyphagia.   PHYSICAL EXAM: Pt in no acute distress  RECENT LABS AND TESTS: BMET    Component Value Date/Time   NA 141 10/30/2017 0803   K 4.3 10/30/2017 0803   CL 101 10/30/2017 0803   CO2 26 10/30/2017 0803   GLUCOSE 90 10/30/2017 0803   GLUCOSE 98 07/16/2017 0911   BUN 9 10/30/2017 0803   CREATININE 0.98 10/30/2017 0803   CREATININE 0.94 11/15/2015 0833   CALCIUM 9.0 10/30/2017 0803   GFRNONAA 95 10/30/2017 0803   GFRNONAA >89 11/15/2015 0833   GFRAA 109 10/30/2017 0803   GFRAA >89 11/15/2015 0833   Lab Results  Component Value Date   HGBA1C 5.7 (H) 10/30/2017   HGBA1C 5.9 07/16/2017   HGBA1C 5.9 (H) 03/08/2017   HGBA1C 5.8 11/16/2015   Lab Results  Component Value Date   INSULIN 15.6 10/30/2017   INSULIN 18.0 03/08/2017   CBC    Component Value Date/Time   WBC 8.6 03/08/2017 0932   WBC 8.8 09/14/2016 1140   RBC 4.63 03/08/2017 0932   RBC 4.89 09/14/2016 1140   HGB 13.2 03/08/2017 0932   HCT 41.5 03/08/2017 0932   PLT 285 03/08/2017 0932   MCV 90 03/08/2017 0932   MCH 28.5 03/08/2017 0932   MCH 27.8 09/14/2016 1140   MCHC 31.8 03/08/2017 0932   MCHC 31.8 09/14/2016 1140   RDW 14.7 03/08/2017 0932   LYMPHSABS 2.4 03/08/2017 0932   MONOABS 0.4 11/15/2015 0833   EOSABS 0.1 03/08/2017 0932   BASOSABS 0.0 03/08/2017 0932   Iron/TIBC/Ferritin/ %Sat No results found for: IRON, TIBC, FERRITIN, IRONPCTSAT Lipid Panel     Component Value Date/Time   CHOL 140 10/30/2017 0803   TRIG 54 10/30/2017 0803   HDL 43 10/30/2017 0803   CHOLHDL 3 07/16/2017 0911   VLDL 13.0 07/16/2017 0911   LDLCALC 86 10/30/2017 0803   Hepatic Function Panel     Component Value Date/Time   PROT 7.6 10/30/2017 0803   ALBUMIN 4.1 10/30/2017 0803   AST 21 10/30/2017 0803   ALT 22 10/30/2017 0803   ALKPHOS 90 10/30/2017 0803   BILITOT 0.3 10/30/2017 0803      Component Value Date/Time   TSH 1.260  03/08/2017 0932   TSH 1.07 11/15/2015 0833   Results for RADLEE, MANECKE DAMON (MRN [id number]) as of 04/10/2018 08:05  Ref. Range 10/30/2017 08:03  Vitamin D, 25-Hydroxy Latest Ref Range: 30.0 - 100.0 ng/mL 33.9    I, Marianna Payment, am acting as Energy manager for [employer], PA-C I, Alois Cliche, PA-C have reviewed above note and agree with its content

## 2018-04-14 ENCOUNTER — Other Ambulatory Visit: Payer: Self-pay | Admitting: Medical

## 2018-04-14 DIAGNOSIS — I10 Essential (primary) hypertension: Secondary | ICD-10-CM

## 2018-04-21 ENCOUNTER — Other Ambulatory Visit: Payer: Self-pay | Admitting: Medical

## 2018-04-24 ENCOUNTER — Other Ambulatory Visit: Payer: Self-pay

## 2018-04-24 ENCOUNTER — Ambulatory Visit (INDEPENDENT_AMBULATORY_CARE_PROVIDER_SITE_OTHER): Payer: Managed Care, Other (non HMO) | Admitting: Physician Assistant

## 2018-04-24 ENCOUNTER — Encounter (INDEPENDENT_AMBULATORY_CARE_PROVIDER_SITE_OTHER): Payer: Self-pay | Admitting: Physician Assistant

## 2018-04-24 DIAGNOSIS — Z6841 Body Mass Index (BMI) 40.0 and over, adult: Secondary | ICD-10-CM

## 2018-04-24 DIAGNOSIS — E559 Vitamin D deficiency, unspecified: Secondary | ICD-10-CM

## 2018-04-24 DIAGNOSIS — R7303 Prediabetes: Secondary | ICD-10-CM

## 2018-04-24 MED ORDER — VITAMIN D (ERGOCALCIFEROL) 1.25 MG (50000 UNIT) PO CAPS: 50000.0000 [IU] | ORAL_CAPSULE | ORAL | 0 refills | Status: DC

## 2018-04-24 MED ORDER — SEMAGLUTIDE 14 MG PO TABS: 14.0000 mg | ORAL_TABLET | Freq: Every day | ORAL | 0 refills | Status: DC

## 2018-04-24 NOTE — Progress Notes (Signed)
Office: [phone]  /  Fax: [phone] TeleHealth Visit:  Louis [name] has verbally consented to this TeleHealth visit today. The patient is located at home, the provider is located at the [facility] office. The participants in this visit include the listed provider and patient. The visit was conducted today via Webex.  HPI:   Chief Complaint: OBESITY Louis [name] is here to discuss his progress with his obesity treatment plan. He is on the Category 3 and modified Category 4 plan and is following his eating plan approximately 90 % of the time. He states he is walking 45 minutes 1 time per week. Louis [name] reports that he is doing well on the plan. He is not weighing himself at home. He notes that he is able to get all the food in on the plan and is not having cravings.  We were unable to weigh the patient today for this TeleHealth visit. He has lost 11 lbs since starting treatment with us.  Vitamin D deficiency Louis [name] has a diagnosis of vitamin D deficiency. He is currently taking vit D and denies nausea, vomiting or muscle weakness.  Pre-Diabetes Louis [name] has a diagnosis of pre-diabetes based on his elevated Hgb A1c and was informed this puts him at greater risk of developing diabetes. He is taking metformin and Rybelsus currently and continues to work on diet and exercise to decrease risk of diabetes. He denies nausea, vomiting, or diarrhea.   ASSESSMENT AND PLAN:  Vitamin D deficiency - Plan: Vitamin D, Ergocalciferol, (DRISDOL) 1.25 MG (50000 UT) CAPS capsule  Prediabetes - Plan: Semaglutide (RYBELSUS) 14 MG TABS  Class 3 severe obesity with serious comorbidity and body mass index (BMI) of 50.0 to 59.9 in adult, unspecified obesity type (HCC)  PLAN:  Vitamin D Deficiency Louis [name] was informed that low vitamin D levels contribute to fatigue and are associated with obesity, breast, and colon cancer. Louis [name] agrees to continue to take prescription Vit D @50 ,000 IU every week #4  with no refills and will follow up for routine testing of vitamin D, at least 2-3 times per year. He was informed of the risk of over-replacement of vitamin D and agrees to not increase his dose unless he discusses this with us first. Louis [name] agrees to follow up in 3 weeks as directed.  Pre-Diabetes Louis [name] will continue to work on weight loss, exercise, and decreasing simple carbohydrates in his diet to help decrease the risk of diabetes. He was informed that eating too many simple carbohydrates or too many calories at one sitting increases the likelihood of GI side effects. Louis [name] agreed to increase Rybelsus to 14 mg #30 with no refills and a prescription was written today. Louis [name] agreed to follow up with us as directed to monitor his progress in 3 weeks.   Obesity Louis [name] is currently in the action stage of change. As such, his goal is to continue with weight loss efforts. He has agreed to follow the Category 3 and modified Category 4 plan. Louis [name] has been instructed to work up to a goal of 150 minutes of combined cardio and strengthening exercise per week for weight loss and overall health benefits. We discussed the following Behavioral Modification Strategies today: work on meal planning and easy cooking plans and keeping healthy foods in the home.  Louis [name] has agreed to follow up with our clinic in 3 weeks. He was informed of the importance of frequent follow up visits to maximize his success with intensive lifestyle modifications for his multiple health  conditions.  ALLERGIES: Allergies  Allergen Reactions  . No Known Allergies     MEDICATIONS: Current Outpatient Medications on File Prior to Visit  Medication Sig Dispense Refill  . acetaminophen (TYLENOL) 500 MG tablet Take 1,000 mg by mouth every 6 (six) hours as needed (for pain.).    Marland Kitchen diclofenac (VOLTAREN) 75 MG EC tablet TAKE 1 TABLET BY MOUTH TWICE A DAY 60 tablet 0  . Insulin Pen Needle (BD PEN NEEDLE NANO 2ND GEN) 32G X 4 MM MISC 1  Package by Does not apply route 2 (two) times daily. 100 each 0  . losartan (COZAAR) 100 MG tablet TAKE 1 TABLET BY MOUTH EVERY DAY 90 tablet 1  . metFORMIN (GLUCOPHAGE) 500 MG tablet TAKE 1 TABLET BY MOUTH EVERY DAY WITH BREAKFAST 30 tablet 0   No current facility-administered medications on file prior to visit.     PAST MEDICAL HISTORY: Past Medical History:  Diagnosis Date  . Allergy   . Arthritis    in both knees  . GERD (gastroesophageal reflux disease)   . Headache   . HTN (hypertension)   . Obesity     PAST SURGICAL HISTORY: Past Surgical History:  Procedure Laterality Date  . ACHILLES TENDON REPAIR    . ANTERIOR CRUCIATE LIGAMENT REPAIR    . LUMBAR LAMINECTOMY/DECOMPRESSION MICRODISCECTOMY Right 09/19/2016   Procedure: Right Lumbar Five-Sacral one Laminotomy/Foraminotomy/removal of Synovial cyst;  Surgeon: [name], MD;  Location: [facility] OR;  Service: Neurosurgery;  Laterality: Right;  Right L5-S1 Laminotomy/Foraminotomy/removal of Synovial cyst  . POSTERIOR CERVICAL LAMINECTOMY N/A 06/12/2016   Procedure: Cervical One Posterior cervical laminectomy;  Surgeon: [name], MD;  Location: [facility] OR;  Service: Neurosurgery;  Laterality: N/A;  posterior  . TONSILLECTOMY      SOCIAL HISTORY: Social History   Tobacco Use  . Smoking status: Former Smoker    Packs/day: 0.25    Years: 3.00    Pack years: 0.75    Types: Cigarettes    Last attempt to quit: 06/14/2016    Years since quitting: 1.8  . Smokeless tobacco: Never Used  Substance Use Topics  . 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  . Drug use: No    FAMILY HISTORY: Family History  Problem Relation Age of Onset  . Diabetes Father   . Obesity Father     ROS: Review of Systems  Gastrointestinal: Negative for diarrhea, nausea and vomiting.  Musculoskeletal:       Negative for muscle weakness.   Endo/Heme/Allergies:       Negative for polyphagia.    PHYSICAL EXAM: Pt in no acute distress  RECENT LABS AND TESTS: BMET    Component Value Date/Time   NA 141 10/30/2017 0803   K 4.3 10/30/2017 0803   CL 101 10/30/2017 0803   CO2 26 10/30/2017 0803   GLUCOSE 90 10/30/2017 0803   GLUCOSE 98 07/16/2017 0911   BUN 9 10/30/2017 0803   CREATININE 0.98 10/30/2017 0803   CREATININE 0.94 11/15/2015 0833   CALCIUM 9.0 10/30/2017 0803   GFRNONAA 95 10/30/2017 0803   GFRNONAA >89 11/15/2015 0833   GFRAA 109 10/30/2017 0803   GFRAA >89 11/15/2015 0833   Lab Results  Component Value Date   HGBA1C 5.7 (H) 10/30/2017   HGBA1C 5.9 07/16/2017   HGBA1C 5.9 (H) 03/08/2017   HGBA1C 5.8 11/16/2015  Lab Results  Component Value Date   INSULIN 15.6 10/30/2017   INSULIN 18.0 03/08/2017   CBC    Component Value Date/Time   WBC 8.6 03/08/2017 0932   WBC 8.8 09/14/2016 1140   RBC 4.63 03/08/2017 0932   RBC 4.89 09/14/2016 1140   HGB 13.2 03/08/2017 0932   HCT 41.5 03/08/2017 0932   PLT 285 03/08/2017 0932   MCV 90 03/08/2017 0932   MCH 28.5 03/08/2017 0932   MCH 27.8 09/14/2016 1140   MCHC 31.8 03/08/2017 0932   MCHC 31.8 09/14/2016 1140   RDW 14.7 03/08/2017 0932   LYMPHSABS 2.4 03/08/2017 0932   MONOABS 0.4 11/15/2015 0833   EOSABS 0.1 03/08/2017 0932   BASOSABS 0.0 03/08/2017 0932   Iron/TIBC/Ferritin/ %Sat No results found for: IRON, TIBC, FERRITIN, IRONPCTSAT Lipid Panel     Component Value Date/Time   CHOL 140 10/30/2017 0803   TRIG 54 10/30/2017 0803   HDL 43 10/30/2017 0803   CHOLHDL 3 07/16/2017 0911   VLDL 13.0 07/16/2017 0911   LDLCALC 86 10/30/2017 0803   Hepatic Function Panel     Component Value Date/Time   PROT 7.6 10/30/2017 0803   ALBUMIN 4.1 10/30/2017 0803   AST 21 10/30/2017 0803   ALT 22 10/30/2017 0803   ALKPHOS 90 10/30/2017 0803   BILITOT 0.3 10/30/2017 0803      Component Value Date/Time   TSH 1.260 03/08/2017 0932   TSH 1.07  11/15/2015 0833   Results for HANFORD, TSUNG DAMON (MRN [id number]) as of 04/24/2018 16:58  Ref. Range 10/30/2017 08:03  Vitamin D, 25-Hydroxy Latest Ref Range: 30.0 - 100.0 ng/mL 33.9     I, Kirke Corin, CMA, am acting as transcriptionist for Alois Cliche, PA-C I, Alois Cliche, PA-C have reviewed above note and agree with its content

## 2018-04-27 ENCOUNTER — Other Ambulatory Visit (INDEPENDENT_AMBULATORY_CARE_PROVIDER_SITE_OTHER): Payer: Self-pay | Admitting: Physician Assistant

## 2018-04-27 DIAGNOSIS — E559 Vitamin D deficiency, unspecified: Secondary | ICD-10-CM

## 2018-05-02 ENCOUNTER — Other Ambulatory Visit (INDEPENDENT_AMBULATORY_CARE_PROVIDER_SITE_OTHER): Payer: Self-pay | Admitting: Physician Assistant

## 2018-05-02 DIAGNOSIS — R7303 Prediabetes: Secondary | ICD-10-CM

## 2018-05-04 ENCOUNTER — Other Ambulatory Visit (INDEPENDENT_AMBULATORY_CARE_PROVIDER_SITE_OTHER): Payer: Self-pay | Admitting: Physician Assistant

## 2018-05-04 DIAGNOSIS — R7303 Prediabetes: Secondary | ICD-10-CM

## 2018-05-13 ENCOUNTER — Ambulatory Visit (INDEPENDENT_AMBULATORY_CARE_PROVIDER_SITE_OTHER): Payer: Managed Care, Other (non HMO) | Admitting: Physician Assistant

## 2018-05-13 ENCOUNTER — Encounter (INDEPENDENT_AMBULATORY_CARE_PROVIDER_SITE_OTHER): Payer: Self-pay | Admitting: Physician Assistant

## 2018-05-13 ENCOUNTER — Other Ambulatory Visit: Payer: Self-pay

## 2018-05-13 DIAGNOSIS — E559 Vitamin D deficiency, unspecified: Secondary | ICD-10-CM | POA: Diagnosis not present

## 2018-05-13 DIAGNOSIS — R7303 Prediabetes: Secondary | ICD-10-CM

## 2018-05-13 DIAGNOSIS — E66813 Obesity, class 3: Secondary | ICD-10-CM

## 2018-05-13 DIAGNOSIS — Z6841 Body Mass Index (BMI) 40.0 and over, adult: Secondary | ICD-10-CM | POA: Diagnosis not present

## 2018-05-13 MED ORDER — METFORMIN HCL 500 MG PO TABS: ORAL_TABLET | ORAL | 0 refills | Status: DC

## 2018-05-14 NOTE — Progress Notes (Signed)
Office: [phone]  /  Fax: [phone] TeleHealth Visit:  Louis [name] has verbally consented to this TeleHealth visit today. The patient is located at home, the provider is located at the [facility] office. The participants in this visit include the listed provider and patient. The visit was conducted today via Webex.  HPI:   Chief Complaint: OBESITY Louis [name] is here to discuss his progress with his obesity treatment plan. He is on the Category 3/modified Category 4 plan and is following his eating plan approximately 90% of the time. He states he is walking 5,500-6,000 steps Monday through Friday and walking 1-2 miles Saturday and Sunday.  Louis [name] reports that he had an abscessed tooth over the last 2 weeks which caused him to be on a mainly soft/liquid diet. He saw his dentist, is taking antibiotics, and is ready to get back on track. We were unable to weigh the patient today for this TeleHealth visit. He feels as if he has gained 2 lbs since his last visit. He has lost 11 lbs since starting treatment with us.  Pre-Diabetes Louis [name] has a diagnosis of prediabetes based on his elevated Hgb A1c and was informed this puts him at greater risk of developing diabetes. He is taking metformin and Rybelsus currently and continues to work on diet and exercise to decrease risk of diabetes. He denies nausea, vomiting, or diarrhea on metformin. No polyphagia.  Vitamin D deficiency Louis [name] has a diagnosis of Vitamin D deficiency. He is currently taking Vit D and denies nausea, vomiting or muscle weakness.  ASSESSMENT AND PLAN:  Prediabetes - Plan: metFORMIN (GLUCOPHAGE) 500 MG tablet  Vitamin D deficiency  Class 3 severe obesity with serious comorbidity and body mass index (BMI) of 50.0 to 59.9 in adult, unspecified obesity type (HCC)  PLAN:  Pre-Diabetes Louis [name] will continue to work on weight loss, exercise, and decreasing simple carbohydrates in his diet to help decrease the risk  of diabetes. We dicussed metformin including benefits and risks. He was informed that eating too many simple carbohydrates or too many calories at one sitting increases the likelihood of GI side effects. Louis [name] is currently taking metformin and a refill prescription was written today for #30 with 0 refills and he agrees to follow-up with our clinic in 2 weeks.  Vitamin D Deficiency Louis [name] was informed that low Vitamin D levels contributes to fatigue and are associated with obesity, breast, and colon cancer. He agrees to continue taking Vit D and will follow-up for routine testing of Vitamin D, at least 2-3 times per year. He was informed of the risk of over-replacement of Vitamin D and agrees to not increase his dose unless he discusses this with us first. Louis [name] agrees to follow-up with our clinic in 2 weeks.  Obesity Louis [name] is currently in the action stage of change. As such, his goal is to continue with weight loss efforts. He has agreed to follow the modified Category 4 plan. Louis [name] has been instructed to work up to a goal of 150 minutes of combined cardio and strengthening exercise per week for weight loss and overall health benefits. We discussed the following Behavioral Modification Strategies today: work on meal planning, easy cooking plans, and keeping healthy foods in the home.  Louis [name] has agreed to follow-up with our clinic in 2 weeks. He was informed of the importance of frequent follow-up visits to maximize his success with intensive lifestyle modifications for his multiple health conditions.  ALLERGIES: Allergies  Allergen Reactions  .  No Known Allergies     MEDICATIONS: Current Outpatient Medications on File Prior to Visit  Medication Sig Dispense Refill  . acetaminophen (TYLENOL) 500 MG tablet Take 1,000 mg by mouth every 6 (six) hours as needed (for pain.).    Marland Kitchen diclofenac (VOLTAREN) 75 MG EC tablet TAKE 1 TABLET BY MOUTH TWICE A DAY 60 tablet 0  . Insulin Pen Needle (BD PEN  NEEDLE NANO 2ND GEN) 32G X 4 MM MISC 1 Package by Does not apply route 2 (two) times daily. 100 each 0  . losartan (COZAAR) 100 MG tablet TAKE 1 TABLET BY MOUTH EVERY DAY 90 tablet 1  . Semaglutide (RYBELSUS) 14 MG TABS Take 14 mg by mouth daily. 30 tablet 0  . Vitamin D, Ergocalciferol, (DRISDOL) 1.25 MG (50000 UT) CAPS capsule Take 1 capsule (50,000 Units total) by mouth every 7 (seven) days. 4 capsule 0   No current facility-administered medications on file prior to visit.     PAST MEDICAL HISTORY: Past Medical History:  Diagnosis Date  . Allergy   . Arthritis    in both knees  . GERD (gastroesophageal reflux disease)   . Headache   . HTN (hypertension)   . Obesity     PAST SURGICAL HISTORY: Past Surgical History:  Procedure Laterality Date  . ACHILLES TENDON REPAIR    . ANTERIOR CRUCIATE LIGAMENT REPAIR    . LUMBAR LAMINECTOMY/DECOMPRESSION MICRODISCECTOMY Right 09/19/2016   Procedure: Right Lumbar Five-Sacral one Laminotomy/Foraminotomy/removal of Synovial cyst;  Surgeon: [name], MD;  Location: [facility] OR;  Service: Neurosurgery;  Laterality: Right;  Right L5-S1 Laminotomy/Foraminotomy/removal of Synovial cyst  . POSTERIOR CERVICAL LAMINECTOMY N/A 06/12/2016   Procedure: Cervical One Posterior cervical laminectomy;  Surgeon: [name], MD;  Location[facility] OR;  Service: Neurosurgery;  Laterality: N/A;  posterior  . TONSILLECTOMY      SOCIAL HISTORY: Social History   Tobacco Use  . Smoking status: Former Smoker    Packs/day: 0.25    Years: 3.00    Pack years: 0.75    Types: Cigarettes    Last attempt to quit: 06/14/2016    Years since quitting: 1.9  . Smokeless tobacco: Never Used  Substance Use Topics  . 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  . Drug use: No    FAMILY HISTORY: Family History  Problem Relation Age of Onset  . Diabetes Father   . Obesity Father     ROS: Review of Systems  Gastrointestinal: Negative for diarrhea, nausea and vomiting.  Musculoskeletal:       Negative for muscle weakness.  Endo/Heme/Allergies:       Negative for polyphagia.   PHYSICAL EXAM: Pt in no acute distress  RECENT LABS AND TESTS: BMET    Component Value Date/Time   NA 141 10/30/2017 0803   K 4.3 10/30/2017 0803   CL 101 10/30/2017 0803   CO2 26 10/30/2017 0803   GLUCOSE 90 10/30/2017 0803   GLUCOSE 98 07/16/2017 0911   BUN 9 10/30/2017 0803   CREATININE 0.98 10/30/2017 0803   CREATININE 0.94 11/15/2015 0833   CALCIUM 9.0 10/30/2017 0803   GFRNONAA 95 10/30/2017 0803   GFRNONAA >89 11/15/2015 0833   GFRAA 109 10/30/2017 0803   GFRAA >89 11/15/2015 0833   Lab Results  Component Value Date   HGBA1C 5.7 (H) 10/30/2017   HGBA1C  5.9 07/16/2017   HGBA1C 5.9 (H) 03/08/2017   HGBA1C 5.8 11/16/2015   Lab Results  Component Value Date   INSULIN 15.6 10/30/2017   INSULIN 18.0 03/08/2017   CBC    Component Value Date/Time   WBC 8.6 03/08/2017 0932   WBC 8.8 09/14/2016 1140   RBC 4.63 03/08/2017 0932   RBC 4.89 09/14/2016 1140   HGB 13.2 03/08/2017 0932   HCT 41.5 03/08/2017 0932   PLT 285 03/08/2017 0932   MCV 90 03/08/2017 0932   MCH 28.5 03/08/2017 0932   MCH 27.8 09/14/2016 1140   MCHC 31.8 03/08/2017 0932   MCHC 31.8 09/14/2016 1140   RDW 14.7 03/08/2017 0932   LYMPHSABS 2.4 03/08/2017 0932   MONOABS 0.4 11/15/2015 0833   EOSABS 0.1 03/08/2017 0932   BASOSABS 0.0 03/08/2017 0932   Iron/TIBC/Ferritin/ %Sat No results found for: IRON, TIBC, FERRITIN, IRONPCTSAT Lipid Panel     Component Value Date/Time   CHOL 140 10/30/2017 0803   TRIG 54 10/30/2017 0803   HDL 43 10/30/2017 0803   CHOLHDL 3 07/16/2017 0911   VLDL 13.0 07/16/2017 0911   LDLCALC 86 10/30/2017 0803   Hepatic Function Panel     Component Value Date/Time   PROT 7.6 10/30/2017 0803   ALBUMIN 4.1 10/30/2017 0803   AST 21 10/30/2017 0803   ALT 22 10/30/2017  0803   ALKPHOS 90 10/30/2017 0803   BILITOT 0.3 10/30/2017 0803      Component Value Date/Time   TSH 1.260 03/08/2017 0932   TSH 1.07 11/15/2015 0833   Results for JUNG, BUESO DAMON (MRN [id number]) as of 05/14/2018 09:50  Ref. Range 10/30/2017 08:03  Vitamin D, 25-Hydroxy Latest Ref Range: 30.0 - 100.0 ng/mL 33.9    I, Marianna Payment, am acting as Energy manager for [employer], PA-C I, Alois Cliche, PA-C have reviewed above note and agree with its content

## 2018-05-27 ENCOUNTER — Other Ambulatory Visit (INDEPENDENT_AMBULATORY_CARE_PROVIDER_SITE_OTHER): Payer: Self-pay | Admitting: Physician Assistant

## 2018-05-27 DIAGNOSIS — E559 Vitamin D deficiency, unspecified: Secondary | ICD-10-CM

## 2018-05-29 ENCOUNTER — Ambulatory Visit (INDEPENDENT_AMBULATORY_CARE_PROVIDER_SITE_OTHER): Payer: Managed Care, Other (non HMO) | Admitting: Physician Assistant

## 2018-05-29 ENCOUNTER — Encounter (INDEPENDENT_AMBULATORY_CARE_PROVIDER_SITE_OTHER): Payer: Self-pay | Admitting: Physician Assistant

## 2018-05-29 ENCOUNTER — Other Ambulatory Visit: Payer: Self-pay

## 2018-05-29 DIAGNOSIS — E559 Vitamin D deficiency, unspecified: Secondary | ICD-10-CM | POA: Diagnosis not present

## 2018-05-29 DIAGNOSIS — R7303 Prediabetes: Secondary | ICD-10-CM | POA: Diagnosis not present

## 2018-05-29 DIAGNOSIS — Z6841 Body Mass Index (BMI) 40.0 and over, adult: Secondary | ICD-10-CM

## 2018-05-29 MED ORDER — SEMAGLUTIDE 14 MG PO TABS: 14.0000 mg | ORAL_TABLET | Freq: Every day | ORAL | 0 refills | Status: DC

## 2018-05-29 MED ORDER — VITAMIN D (ERGOCALCIFEROL) 1.25 MG (50000 UNIT) PO CAPS: 50000.0000 [IU] | ORAL_CAPSULE | ORAL | 0 refills | Status: DC

## 2018-05-29 NOTE — Progress Notes (Signed)
Office: [phone]  /  Fax: [phone] TeleHealth Visit:  Louis [name] has verbally consented to this TeleHealth visit today. The patient is located at home, the provider is located at the [facility] office. The participants in this visit include the listed provider and patient. The visit was conducted today via Webex.  HPI:   Chief Complaint: OBESITY Louis [name] is here to discuss his progress with his obesity treatment plan. He is on the Category 3/modified Category 4 plan and is following his eating plan approximately 85% of the time. He states he is walking 1.5 miles 2 times per week. Abeer reports that he has been doing better overall with getting his food in but has been splurging more on weekends. We were unable to weigh the patient today for this TeleHealth visit. He feels as if he has maintained his weight since his last visit. He has lost 11 lbs since starting treatment with [name].  Vitamin D deficiency Louis [name] has a diagnosis of Vitamin D deficiency. He is currently taking prescription Vit D and denies nausea, vomiting or muscle weakness.  Pre-Diabetes Louis [name] has a diagnosis of prediabetes based on his elevated Hgb A1c and was informed this puts him at greater risk of developing diabetes. He is on Rybelsus. He continues to work on diet and exercise to decrease risk of diabetes. He denies nausea, vomiting, or diarrhea. No polyphagia.  ASSESSMENT AND PLAN:  Vitamin D deficiency - Plan: Vitamin D, Ergocalciferol, (DRISDOL) 1.25 MG (50000 UT) CAPS capsule  Prediabetes - Plan: Semaglutide (RYBELSUS) 14 MG TABS  Class 3 severe obesity with serious comorbidity and body mass index (BMI) of 50.0 to 59.9 in adult, unspecified obesity type (HCC)  PLAN:  Vitamin D Deficiency Louis [name] was informed that low Vitamin D levels contributes to fatigue and are associated with obesity, breast, and colon cancer. He agrees to continue to take prescription Vit D @ 50,000 IU every week #4  with 0 refills and will follow-up for routine testing of Vitamin D, at least 2-3 times per year. He was informed of the risk of over-replacement of Vitamin D and agrees to not increase his dose unless he discusses this with [name] first. Louis [name] agrees to follow-up with our clinic in 2 weeks.  Pre-Diabetes Louis [name] will continue to work on weight loss, exercise, and decreasing simple carbohydrates in his diet to help decrease the risk of diabetes. We dicussed metformin including benefits and risks. He was informed that eating too many simple carbohydrates or too many calories at one sitting increases the likelihood of GI side effects. Louis [name] was given a refill on his Rybelsus #30 with 0 refills and he agrees to follow-up with our clinic in 2 weeks.  Obesity Louis [name] is currently in the action stage of change. As such, his goal is to continue with weight loss efforts. He has agreed to follow the Category 3/modified Category 4 plan. Louis [name] has been instructed to work up to a goal of 150 minutes of combined cardio and strengthening exercise per week for weight loss and overall health benefits. We discussed the following Behavioral Modification Strategies today: work on meal planning, easy cooking plans, and ways to avoid night time snacking.  Louis [name] has agreed to follow-up with our clinic in 2 weeks. He was informed of the importance of frequent follow-up visits to maximize his success with intensive lifestyle modifications for his multiple health conditions.  ALLERGIES: Allergies  Allergen Reactions  . No Known Allergies     MEDICATIONS: Current  Outpatient Medications on File Prior to Visit  Medication Sig Dispense Refill  . acetaminophen (TYLENOL) 500 MG tablet Take 1,000 mg by mouth every 6 (six) hours as needed (for pain.).    Marland Kitchen. diclofenac (VOLTAREN) 75 MG EC tablet TAKE 1 TABLET BY MOUTH TWICE A DAY 60 tablet 0  . Insulin Pen Needle (BD PEN NEEDLE NANO 2ND GEN) 32G X 4 MM MISC 1 Package by Does not apply  route 2 (two) times daily. 100 each 0  . losartan (COZAAR) 100 MG tablet TAKE 1 TABLET BY MOUTH EVERY DAY 90 tablet 1  . metFORMIN (GLUCOPHAGE) 500 MG tablet TAKE 1 TABLET BY MOUTH EVERY DAY WITH BREAKFAST 30 tablet 0  . Semaglutide (RYBELSUS) 14 MG TABS Take 14 mg by mouth daily. 30 tablet 0  . Vitamin D, Ergocalciferol, (DRISDOL) 1.25 MG (50000 UT) CAPS capsule Take 1 capsule (50,000 Units total) by mouth every 7 (seven) days. 4 capsule 0   No current facility-administered medications on file prior to visit.     PAST MEDICAL HISTORY: Past Medical History:  Diagnosis Date  . Allergy   . Arthritis    in both knees  . GERD (gastroesophageal reflux disease)   . Headache   . HTN (hypertension)   . Obesity     PAST SURGICAL HISTORY: Past Surgical History:  Procedure Laterality Date  . ACHILLES TENDON REPAIR    . ANTERIOR CRUCIATE LIGAMENT REPAIR    . LUMBAR LAMINECTOMY/DECOMPRESSION MICRODISCECTOMY Right 09/19/2016   Procedure: Right Lumbar Five-Sacral one Laminotomy/Foraminotomy/removal of Synovial cyst;  Surgeon: Barnett AbuElsner, Henry, MD;  [facility];  Service: Neurosurgery;  Laterality: Right;  Right L5-S1 Laminotomy/Foraminotomy/removal of Synovial cyst  . POSTERIOR CERVICAL LAMINECTOMY N/A 06/12/2016   Procedure: Cervical One Posterior cervical laminectomy;  Surgeon: Barnett AbuElsner, Henry, MD;  [facility];  Service: Neurosurgery;  Laterality: N/A;  posterior  . TONSILLECTOMY      SOCIAL HISTORY: Social History   Tobacco Use  . Smoking status: Former Smoker    Packs/day: 0.25    Years: 3.00    Pack years: 0.75    Types: Cigarettes    Last attempt to quit: 06/14/2016    Years since quitting: 1.9  . Smokeless tobacco: Never Used  Substance Use Topics  . 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  . Drug use: No    FAMILY HISTORY: Family History  Problem Relation Age of Onset  .  Diabetes Father   . Obesity Father    ROS: Review of Systems  Gastrointestinal: Negative for diarrhea, nausea and vomiting.  Musculoskeletal:       Negative for muscle weakness.  Endo/Heme/Allergies:       Negative for polyphagia.   PHYSICAL EXAM: Pt in no acute distress  RECENT LABS AND TESTS: BMET    Component Value Date/Time   NA 141 10/30/2017 0803   K 4.3 10/30/2017 0803   CL 101 10/30/2017 0803   CO2 26 10/30/2017 0803   GLUCOSE 90 10/30/2017 0803   GLUCOSE 98 07/16/2017 0911   BUN 9 10/30/2017 0803   CREATININE 0.98 10/30/2017 0803   CREATININE 0.94 11/15/2015 0833   CALCIUM 9.0 10/30/2017 0803   GFRNONAA 95 10/30/2017 0803   GFRNONAA >89 11/15/2015 0833   GFRAA 109 10/30/2017 0803   GFRAA >89 11/15/2015 0833   Lab Results  Component Value  Date   HGBA1C 5.7 (H) 10/30/2017   HGBA1C 5.9 07/16/2017   HGBA1C 5.9 (H) 03/08/2017   HGBA1C 5.8 11/16/2015   Lab Results  Component Value Date   INSULIN 15.6 10/30/2017   INSULIN 18.0 03/08/2017   CBC    Component Value Date/Time   WBC 8.6 03/08/2017 0932   WBC 8.8 09/14/2016 1140   RBC 4.63 03/08/2017 0932   RBC 4.89 09/14/2016 1140   HGB 13.2 03/08/2017 0932   HCT 41.5 03/08/2017 0932   PLT 285 03/08/2017 0932   MCV 90 03/08/2017 0932   MCH 28.5 03/08/2017 0932   MCH 27.8 09/14/2016 1140   MCHC 31.8 03/08/2017 0932   MCHC 31.8 09/14/2016 1140   RDW 14.7 03/08/2017 0932   LYMPHSABS 2.4 03/08/2017 0932   MONOABS 0.4 11/15/2015 0833   EOSABS 0.1 03/08/2017 0932   BASOSABS 0.0 03/08/2017 0932   Iron/TIBC/Ferritin/ %Sat No results found for: IRON, TIBC, FERRITIN, IRONPCTSAT Lipid Panel     Component Value Date/Time   CHOL 140 10/30/2017 0803   TRIG 54 10/30/2017 0803   HDL 43 10/30/2017 0803   CHOLHDL 3 07/16/2017 0911   VLDL 13.0 07/16/2017 0911   LDLCALC 86 10/30/2017 0803   Hepatic Function Panel     Component Value Date/Time   PROT 7.6 10/30/2017 0803   ALBUMIN 4.1 10/30/2017 0803   AST 21  10/30/2017 0803   ALT 22 10/30/2017 0803   ALKPHOS 90 10/30/2017 0803   BILITOT 0.3 10/30/2017 0803      Component Value Date/Time   TSH 1.260 03/08/2017 0932   TSH 1.07 11/15/2015 0833   Results for TOBEY, SCHMELZLE [name] (MRN [id number]) as of 05/29/2018 16:09  Ref. Range 10/30/2017 08:03  Vitamin D, 25-Hydroxy Latest Ref Range: 30.0 - 100.0 ng/mL 33.9    I, Marianna Payment, am acting as Energy manager for [employer], PA-C I, Alois Cliche, PA-C have reviewed above note and agree with its content

## 2018-06-09 ENCOUNTER — Other Ambulatory Visit (INDEPENDENT_AMBULATORY_CARE_PROVIDER_SITE_OTHER): Payer: Self-pay | Admitting: Physician Assistant

## 2018-06-09 DIAGNOSIS — R7303 Prediabetes: Secondary | ICD-10-CM

## 2018-06-11 ENCOUNTER — Encounter (INDEPENDENT_AMBULATORY_CARE_PROVIDER_SITE_OTHER): Payer: Self-pay | Admitting: Physician Assistant

## 2018-06-11 ENCOUNTER — Ambulatory Visit (INDEPENDENT_AMBULATORY_CARE_PROVIDER_SITE_OTHER): Payer: Managed Care, Other (non HMO) | Admitting: Physician Assistant

## 2018-06-11 ENCOUNTER — Other Ambulatory Visit: Payer: Self-pay

## 2018-06-11 DIAGNOSIS — Z6841 Body Mass Index (BMI) 40.0 and over, adult: Secondary | ICD-10-CM | POA: Diagnosis not present

## 2018-06-11 DIAGNOSIS — E559 Vitamin D deficiency, unspecified: Secondary | ICD-10-CM

## 2018-06-12 NOTE — Progress Notes (Signed)
Office: [phone]  /  Fax: [phone] TeleHealth Visit:  [name] has verbally consented to this TeleHealth visit today. The patient is located at home, the provider is located at the [facility] office. The participants in this visit include the listed provider and patient. The visit was conducted today via Webex.  HPI:   Chief Complaint: OBESITY Louis [name] is here to discuss his progress with his obesity treatment plan. He is on the Category 3 and modified Category 4 plan and is following his eating plan approximately 90 % of the time. He states he is exercising 0 minutes 0 times per week. Louis [name] reports following the plan closely. He is frustrated with the lack of loss. He is not as active in his job currently, but is getting ready to start a new project at work that will keep his activity level high.  We were unable to weigh the patient today for this TeleHealth visit. He feels as if he has maintained weight since his last visit. He has lost 11 lbs since starting treatment with us.  Vitamin D Deficiency Louis [name] has a diagnosis of vitamin D deficiency. He is currently on vit D. Louis [name] denies nausea, vomiting, or muscle weakness.  ASSESSMENT AND PLAN:  Vitamin D deficiency  Class 3 severe obesity with serious comorbidity and body mass index (BMI) of 50.0 to 59.9 in adult, unspecified obesity type (HCC)  PLAN:  Vitamin D Deficiency Louis [name] was informed that low vitamin D levels contribute to fatigue and are associated with obesity, breast, and colon cancer. Louis [name] agrees to continue to take prescription Vit D @50 ,000 IU every week and will follow up for routine testing of vitamin D, at least 2-3 times per year. He was informed of the risk of over-replacement of vitamin D and agrees to not increase his dose unless he discusses this with us first. Louis [name] agrees to follow up in 2 weeks as directed.  Obesity Louis [name] is currently in the action stage of change. As such, his  goal is to continue with weight loss efforts. He has agreed to change to follow the Category 3 plan. Louis [name] has been instructed to work up to a goal of 150 minutes of combined cardio and strengthening exercise per week for weight loss and overall health benefits. We discussed the following Behavioral Modification Strategies today: work on meal planning and easy cooking plans and keeping healthy foods in the home.  Louis [name] has agreed to follow up with our clinic in 2 weeks. He was informed of the importance of frequent follow up visits to maximize his success with intensive lifestyle modifications for his multiple health conditions.  ALLERGIES: Allergies  Allergen Reactions  . No Known Allergies     MEDICATIONS: Current Outpatient Medications on File Prior to Visit  Medication Sig Dispense Refill  . acetaminophen (TYLENOL) 500 MG tablet Take 1,000 mg by mouth every 6 (six) hours as needed (for pain.).    Marland Kitchen. diclofenac (VOLTAREN) 75 MG EC tablet TAKE 1 TABLET BY MOUTH TWICE A DAY 60 tablet 0  . Insulin Pen Needle (BD PEN NEEDLE NANO 2ND GEN) 32G X 4 MM MISC 1 Package by Does not apply route 2 (two) times daily. 100 each 0  . losartan (COZAAR) 100 MG tablet TAKE 1 TABLET BY MOUTH EVERY DAY 90 tablet 1  . metFORMIN (GLUCOPHAGE) 500 MG tablet TAKE 1 TABLET BY MOUTH EVERY DAY WITH BREAKFAST 30 tablet 0  . Semaglutide (RYBELSUS) 14 MG TABS Take 14 mg  by mouth daily. 30 tablet 0  . Vitamin D, Ergocalciferol, (DRISDOL) 1.25 MG (50000 UT) CAPS capsule Take 1 capsule (50,000 Units total) by mouth every 7 (seven) days. 4 capsule 0   No current facility-administered medications on file prior to visit.     PAST MEDICAL HISTORY: Past Medical History:  Diagnosis Date  . Allergy   . Arthritis    in both knees  . GERD (gastroesophageal reflux disease)   . Headache   . HTN (hypertension)   . Obesity     PAST SURGICAL HISTORY: Past Surgical History:  Procedure Laterality Date  . ACHILLES TENDON  REPAIR    . ANTERIOR CRUCIATE LIGAMENT REPAIR    . LUMBAR LAMINECTOMY/DECOMPRESSION MICRODISCECTOMY Right 09/19/2016   Procedure: Right Lumbar Five-Sacral one Laminotomy/Foraminotomy/removal of Synovial cyst;  Surgeon: Barnett AbuElsner, Henry, MD;  [facility];  Service: Neurosurgery;  Laterality: Right;  Right L5-S1 Laminotomy/Foraminotomy/removal of Synovial cyst  . POSTERIOR CERVICAL LAMINECTOMY N/A 06/12/2016   Procedure: Cervical One Posterior cervical laminectomy;  Surgeon: Barnett AbuElsner, Henry, MD;  [facility];  Service: Neurosurgery;  Laterality: N/A;  posterior  . TONSILLECTOMY      SOCIAL HISTORY: Social History   Tobacco Use  . Smoking status: Former Smoker    Packs/day: 0.25    Years: 3.00    Pack years: 0.75    Types: Cigarettes    Last attempt to quit: 06/14/2016    Years since quitting: 1.9  . Smokeless tobacco: Never Used  Substance Use Topics  . 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  . Drug use: No    FAMILY HISTORY: Family History  Problem Relation Age of Onset  . Diabetes Father   . Obesity Father     ROS: Review of Systems  Gastrointestinal: Negative for nausea and vomiting.  Musculoskeletal:       Negative for muscle weakness.    PHYSICAL EXAM: Pt in no acute distress  RECENT LABS AND TESTS: BMET    Component Value Date/Time   NA 141 10/30/2017 0803   K 4.3 10/30/2017 0803   CL 101 10/30/2017 0803   CO2 26 10/30/2017 0803   GLUCOSE 90 10/30/2017 0803   GLUCOSE 98 07/16/2017 0911   BUN 9 10/30/2017 0803   CREATININE 0.98 10/30/2017 0803   CREATININE 0.94 11/15/2015 0833   CALCIUM 9.0 10/30/2017 0803   GFRNONAA 95 10/30/2017 0803   GFRNONAA >89 11/15/2015 0833   GFRAA 109 10/30/2017 0803   GFRAA >89 11/15/2015 0833   Lab Results  Component Value Date   HGBA1C 5.7 (H) 10/30/2017   HGBA1C 5.9 07/16/2017   HGBA1C 5.9 (H) 03/08/2017   HGBA1C 5.8  11/16/2015   Lab Results  Component Value Date   INSULIN 15.6 10/30/2017   INSULIN 18.0 03/08/2017   CBC    Component Value Date/Time   WBC 8.6 03/08/2017 0932   WBC 8.8 09/14/2016 1140   RBC 4.63 03/08/2017 0932   RBC 4.89 09/14/2016 1140   HGB 13.2 03/08/2017 0932   HCT 41.5 03/08/2017 0932   PLT 285 03/08/2017 0932   MCV 90 03/08/2017 0932   MCH 28.5 03/08/2017 0932   MCH 27.8 09/14/2016 1140   MCHC 31.8 03/08/2017 0932   MCHC 31.8 09/14/2016 1140   RDW 14.7 03/08/2017 0932   LYMPHSABS 2.4 03/08/2017 0932   MONOABS 0.4 11/15/2015  6945   EOSABS 0.1 03/08/2017 0932   BASOSABS 0.0 03/08/2017 0932   Iron/TIBC/Ferritin/ %Sat No results found for: IRON, TIBC, FERRITIN, IRONPCTSAT Lipid Panel     Component Value Date/Time   CHOL 140 10/30/2017 0803   TRIG 54 10/30/2017 0803   HDL 43 10/30/2017 0803   CHOLHDL 3 07/16/2017 0911   VLDL 13.0 07/16/2017 0911   LDLCALC 86 10/30/2017 0803   Hepatic Function Panel     Component Value Date/Time   PROT 7.6 10/30/2017 0803   ALBUMIN 4.1 10/30/2017 0803   AST 21 10/30/2017 0803   ALT 22 10/30/2017 0803   ALKPHOS 90 10/30/2017 0803   BILITOT 0.3 10/30/2017 0803      Component Value Date/Time   TSH 1.260 03/08/2017 0932   TSH 1.07 11/15/2015 0833   Results for DIERRE, CREVIER Louis (MRN [id number]) as of 06/12/2018 16:24  Ref. Range 10/30/2017 08:03  Vitamin D, 25-Hydroxy Latest Ref Range: 30.0 - 100.0 ng/mL 33.9    I, Marcille Blanco, CMA, am acting as transcriptionist for Abby Potash, PA-C I, Abby Potash, PA-C have reviewed above note and agree with its content

## 2018-06-20 ENCOUNTER — Other Ambulatory Visit (INDEPENDENT_AMBULATORY_CARE_PROVIDER_SITE_OTHER): Payer: Self-pay | Admitting: Physician Assistant

## 2018-06-20 ENCOUNTER — Encounter (INDEPENDENT_AMBULATORY_CARE_PROVIDER_SITE_OTHER): Payer: Self-pay

## 2018-06-20 DIAGNOSIS — E559 Vitamin D deficiency, unspecified: Secondary | ICD-10-CM

## 2018-06-20 DIAGNOSIS — R7303 Prediabetes: Secondary | ICD-10-CM

## 2018-06-25 ENCOUNTER — Encounter (INDEPENDENT_AMBULATORY_CARE_PROVIDER_SITE_OTHER): Payer: Self-pay | Admitting: Physician Assistant

## 2018-06-25 ENCOUNTER — Telehealth (INDEPENDENT_AMBULATORY_CARE_PROVIDER_SITE_OTHER): Payer: Managed Care, Other (non HMO) | Admitting: Physician Assistant

## 2018-06-25 ENCOUNTER — Other Ambulatory Visit: Payer: Self-pay

## 2018-06-25 DIAGNOSIS — R7303 Prediabetes: Secondary | ICD-10-CM | POA: Diagnosis not present

## 2018-06-25 DIAGNOSIS — Z6841 Body Mass Index (BMI) 40.0 and over, adult: Secondary | ICD-10-CM

## 2018-06-25 DIAGNOSIS — E559 Vitamin D deficiency, unspecified: Secondary | ICD-10-CM | POA: Diagnosis not present

## 2018-06-27 MED ORDER — VITAMIN D (ERGOCALCIFEROL) 1.25 MG (50000 UNIT) PO CAPS: 50000.0000 [IU] | ORAL_CAPSULE | ORAL | 0 refills | Status: DC

## 2018-06-27 MED ORDER — METFORMIN HCL 500 MG PO TABS: ORAL_TABLET | ORAL | 0 refills | Status: DC

## 2018-06-27 NOTE — Progress Notes (Signed)
Office: [phone]  /  Fax: [phone] TeleHealth Visit:  Louis [name] has verbally consented to this TeleHealth visit today. The patient is located at home, the provider is located at the [facility]. The participants in this visit include the listed provider and patient. The visit was conducted today via Webex.  HPI:   Chief Complaint: OBESITY Louis [name] is here to discuss his progress with his obesity treatment plan. He is on the Category 3/Category 4 plan and is following his eating plan approximately 90% of the time. He states he is walking 1-1.5 miles 2 days per week. Louis [name] reports that he had an issue with his back over the last week and believes that he may have a cyst that has returned pressing on his spinal cord. He will be calling his neurosurgeon regarding this. He states that the plan is going well but he wants information on bariatric surgery. We were unable to weigh the patient today for this TeleHealth visit. He feels as if he has maintained his weight since his last visit. He has lost 11 lbs since starting treatment with [name].  Pre-Diabetes Louis [name] has a diagnosis of prediabetes based on his elevated Hgb A1c and was informed this puts him at greater risk of developing diabetes. He is taking metformin currently and continues to work on diet and exercise to decrease risk of diabetes. He denies nausea, vomiting, or diarrhea on metformin. No polyphagia.  Vitamin D deficiency Louis [name] has a diagnosis of Vitamin D deficiency. He is currently taking prescription Vit D and denies nausea, vomiting or muscle weakness.  ASSESSMENT AND PLAN:  No diagnosis found.  PLAN:  Pre-Diabetes Louis [name] will continue to work on weight loss, exercise, and decreasing simple carbohydrates in his diet to help decrease the risk of diabetes. We dicussed metformin including benefits and risks. He was informed that eating too many simple carbohydrates or too many calories at one sitting  increases the likelihood of GI side effects. Louis [name] was given a refill on his metformin #30 and will follow-up with our clinic in 2 weeks.  Vitamin D Deficiency Louis [name] was informed that low Vitamin D levels contributes to fatigue and are associated with obesity, breast, and colon cancer. He agrees to continue to take prescription Vit D @ 50,000 IU every week and will follow-up for routine testing of Vitamin D, at least 2-3 times per year. He was informed of the risk of over-replacement of Vitamin D and agrees to not increase his dose unless he discusses this with [name] first. Louis [name] agrees to follow-up with our clinic in 2 weeks.  Obesity Louis [name] is currently in the action stage of change. As such, his goal is to continue with weight loss efforts. He has agreed to follow the Category 4 plan. Louis [name] has been instructed to work up to a goal of 150 minutes of combined cardio and strengthening exercise per week for weight loss and overall health benefits. We discussed the following Behavioral Modification Strategies today: work on meal planning, easy cooking plans, and keeping healthy foods in the home.  Louis [name] has agreed to follow-up with our clinic in 2 weeks. He was informed of the importance of frequent follow-up visits to maximize his success with intensive lifestyle modifications for his multiple health conditions.  ALLERGIES: Allergies  Allergen Reactions   No Known Allergies     MEDICATIONS: Current Outpatient Medications on File Prior to Visit  Medication Sig Dispense Refill   acetaminophen (TYLENOL) 500 MG tablet Take 1,000  mg by mouth every 6 (six) hours as needed (for pain.).     diclofenac (VOLTAREN) 75 MG EC tablet TAKE 1 TABLET BY MOUTH TWICE A DAY 60 tablet 0   Insulin Pen Needle (BD PEN NEEDLE NANO 2ND GEN) 32G X 4 MM MISC 1 Package by Does not apply route 2 (two) times daily. 100 each 0   losartan (COZAAR) 100 MG tablet TAKE 1 TABLET BY MOUTH EVERY DAY 90 tablet 1   metFORMIN  (GLUCOPHAGE) 500 MG tablet TAKE 1 TABLET BY MOUTH EVERY DAY WITH BREAKFAST 30 tablet 0   Semaglutide (RYBELSUS) 14 MG TABS Take 14 mg by mouth daily. 30 tablet 0   Vitamin D, Ergocalciferol, (DRISDOL) 1.25 MG (50000 UT) CAPS capsule Take 1 capsule (50,000 Units total) by mouth every 7 (seven) days. 4 capsule 0   No current facility-administered medications on file prior to visit.     PAST MEDICAL HISTORY: Past Medical History:  Diagnosis Date   Allergy    Arthritis    in both knees   GERD (gastroesophageal reflux disease)    Headache    HTN (hypertension)    Obesity     PAST SURGICAL HISTORY: Past Surgical History:  Procedure Laterality Date   ACHILLES TENDON REPAIR     ANTERIOR CRUCIATE LIGAMENT REPAIR     LUMBAR LAMINECTOMY/DECOMPRESSION MICRODISCECTOMY Right 09/19/2016   Procedure: Right Lumbar Five-Sacral one Laminotomy/Foraminotomy/removal of Synovial cyst;  Surgeon: Barnett AbuElsner, Henry, MD;  [facility];  Service: Neurosurgery;  Laterality: Right;  Right L5-S1 Laminotomy/Foraminotomy/removal of Synovial cyst   POSTERIOR CERVICAL LAMINECTOMY N/A 06/12/2016   Procedure: Cervical One Posterior cervical laminectomy;  Surgeon: Barnett AbuElsner, Henry, MD;  [facility];  Service: Neurosurgery;  Laterality: N/A;  posterior   TONSILLECTOMY      SOCIAL HISTORY: Social History   Tobacco Use   Smoking status: Former Smoker    Packs/day: 0.25    Years: 3.00    Pack years: 0.75    Types: Cigarettes    Quit date: 06/14/2016    Years since quitting: 2.0   Smokeless tobacco: Never Used  Substance Use Topics   Alcohol use: Yes    Alcohol/week: 3.0 standard drinks    Types: 3 Cans of beer per week    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   Drug use: No    FAMILY HISTORY: Family History  Problem Relation Age of Onset   Diabetes Father    Obesity Father    ROS: Review of Systems  Gastrointestinal: Negative for diarrhea, nausea and vomiting.    Musculoskeletal:       Negative for muscle weakness.  Endo/Heme/Allergies:       Negative for polyphagia.   PHYSICAL EXAM: Pt in no acute distress  RECENT LABS AND TESTS: BMET    Component Value Date/Time   NA 141 10/30/2017 0803   K 4.3 10/30/2017 0803   CL 101 10/30/2017 0803   CO2 26 10/30/2017 0803   GLUCOSE 90 10/30/2017 0803   GLUCOSE 98 07/16/2017 0911   BUN 9 10/30/2017 0803   CREATININE 0.98 10/30/2017 0803   CREATININE 0.94 11/15/2015 0833   CALCIUM 9.0 10/30/2017 0803   GFRNONAA 95 10/30/2017 0803   GFRNONAA >89 11/15/2015 0833   GFRAA 109 10/30/2017 0803   GFRAA >89 11/15/2015 0833   Lab Results  Component Value Date   HGBA1C 5.7 (H) 10/30/2017   HGBA1C 5.9 07/16/2017   HGBA1C 5.9 (H) 03/08/2017   HGBA1C 5.8  11/16/2015   Lab Results  Component Value Date   INSULIN 15.6 10/30/2017   INSULIN 18.0 03/08/2017   CBC    Component Value Date/Time   WBC 8.6 03/08/2017 0932   WBC 8.8 09/14/2016 1140   RBC 4.63 03/08/2017 0932   RBC 4.89 09/14/2016 1140   HGB 13.2 03/08/2017 0932   HCT 41.5 03/08/2017 0932   PLT 285 03/08/2017 0932   MCV 90 03/08/2017 0932   MCH 28.5 03/08/2017 0932   MCH 27.8 09/14/2016 1140   MCHC 31.8 03/08/2017 0932   MCHC 31.8 09/14/2016 1140   RDW 14.7 03/08/2017 0932   LYMPHSABS 2.4 03/08/2017 0932   MONOABS 0.4 11/15/2015 0833   EOSABS 0.1 03/08/2017 0932   BASOSABS 0.0 03/08/2017 0932   Iron/TIBC/Ferritin/ %Sat No results found for: IRON, TIBC, FERRITIN, IRONPCTSAT Lipid Panel     Component Value Date/Time   CHOL 140 10/30/2017 0803   TRIG 54 10/30/2017 0803   HDL 43 10/30/2017 0803   CHOLHDL 3 07/16/2017 0911   VLDL 13.0 07/16/2017 0911   LDLCALC 86 10/30/2017 0803   Hepatic Function Panel     Component Value Date/Time   PROT 7.6 10/30/2017 0803   ALBUMIN 4.1 10/30/2017 0803   AST 21 10/30/2017 0803   ALT 22 10/30/2017 0803   ALKPHOS 90 10/30/2017 0803   BILITOT 0.3 10/30/2017 0803      Component Value  Date/Time   TSH 1.260 03/08/2017 0932   TSH 1.07 11/15/2015 0833   Results for Louis [name], Louis [name] (MRN [id number]) as of 06/27/2018 09:18  Ref. Range 10/30/2017 08:03  Vitamin D, 25-Hydroxy Latest Ref Range: 30.0 - 100.0 ng/mL 33.9    I, Marianna Paymentenise Haag, am acting as Energy managertranscriptionist for [employer]y Aguilar, PA-C I, Alois Clicheracey Aguilar, PA-C have reviewed above note and agree with its content

## 2018-07-09 ENCOUNTER — Other Ambulatory Visit: Payer: Self-pay

## 2018-07-10 ENCOUNTER — Ambulatory Visit (INDEPENDENT_AMBULATORY_CARE_PROVIDER_SITE_OTHER): Payer: Managed Care, Other (non HMO) | Admitting: Medical

## 2018-07-10 ENCOUNTER — Encounter: Payer: Self-pay | Admitting: Medical

## 2018-07-10 VITALS — BP 133/77 | HR 62 | Temp 98.3°F | Resp 16 | Ht 72.0 in | Wt >= 6400 oz

## 2018-07-10 DIAGNOSIS — M5416 Radiculopathy, lumbar region: Secondary | ICD-10-CM

## 2018-07-10 DIAGNOSIS — R29898 Other symptoms and signs involving the musculoskeletal system: Secondary | ICD-10-CM | POA: Diagnosis not present

## 2018-07-10 MED ORDER — KETOROLAC TROMETHAMINE 60 MG/2ML IM SOLN
60.0000 mg | Freq: Once | INTRAMUSCULAR | Status: AC
  Administered 2018-07-10: 10:00:00 60 mg via INTRAMUSCULAR

## 2018-07-10 MED ORDER — PREDNISONE 5 MG (21) PO TBPK: ORAL_TABLET | ORAL | 0 refills | Status: DC

## 2018-07-10 MED ORDER — HYDROCODONE-ACETAMINOPHEN 5-325 MG PO TABS: 1.0000 | ORAL_TABLET | Freq: Four times a day (QID) | ORAL | 0 refills | Status: DC | PRN

## 2018-07-10 MED ORDER — CYCLOBENZAPRINE HCL 10 MG PO TABS: 10.0000 mg | ORAL_TABLET | Freq: Every day | ORAL | 0 refills | Status: DC

## 2018-07-10 MED ORDER — MELOXICAM 7.5 MG PO TABS: 7.5000 mg | ORAL_TABLET | Freq: Every day | ORAL | 0 refills | Status: DC

## 2018-07-10 NOTE — Patient Instructions (Signed)
For your lower lumbar back pain with radiating features and some mild weakness, I do want to go ahead and try to get MRI of the lumbar spine as you do have known history of positive findings.  History of surgery and now some recurrent more severe pain again.  We gave you Toradol 60 mg IM injection today.  I want you to start prednisone six-day taper dose pack.  Prescribed Flexeril tablets to use at night.  Also sent prescription of Norco to your pharmacy use as needed for severe pain.  Prescription of meloxicam sent as well but not to use meloxicam so you finish the taper prednisone.  Red flag signs and symptoms reviewed today that would indicate need to go to emergency department.  If MRI denied by insurance then would asked her to notify her neurosurgeons office and see if they could see you ASAP so they could attempt to get the MRI done more quickly.  Follow-up in 10 days or as needed.

## 2018-07-10 NOTE — Progress Notes (Signed)
Subjective:    Patient ID: Louis [name], male    DOB: February 03, 1975, 43 y.o.   MRN: [id number]  HPI  Pt in for some lower back pain with some radiating features. Pt had pain before in the past month. Last 2 weeks pain is getting worse. He states pain radiating down his leg and some pain radiating to groin. Describes intermittent episodes of feeling like has to urinate but some delayed ability but eventually will be able to. Some numbness toward testicle and perineum area. From his heel to pinkey to is numb. No foot drop. He feels like left leg is weaker. He feels like his left lower ext muscles feel tight. Symptoms area worse at night. Pain most prominent at night after resting. When gettting up to urinate at night he states severe pain so much that he can barely due to excruciating pain  Pt last mri in 2018 showed   IMPRESSION: 1. 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 Correlation with radiographs is recommended prior to any operative intervention. 2. Severe facet degeneration at L5-S1 with severe right lateral recess stenosis at the right S1 nerve level due to an 8 mm degenerative synovial cyst arising from the right facet joint. 3. Superimposed diffuse spinal epidural lipomatosis with multilevel disc bulging and facet hypertrophy. Subsequent mild or moderate spinal stenosis throughout the lumbar spine. 4. Decreased T1 bone marrow signal is nonspecific but probably benign in this clinical setting, with common etiologies including anemia, smoking, and obesity.   Recently he has used tylenol and gabapentin.(he used wife 300 mg tablet). Still has pain.  Pt is not diabetic.      Past Medical History:  Diagnosis Date  . Allergy   . Arthritis    in both knees  . GERD (gastroesophageal reflux disease)   . Headache   . HTN (hypertension)   . Obesity      Social History   Socioeconomic History  . Marital status: Married    Spouse name: Bunnie PionCarol [name]  . Number of children: 7  . Years of education: Not on file  . Highest education level: Not on file  Occupational History  . Occupation: Corporate investment bankerConstruction worker  Social Needs  . Financial resource strain: Not on file  . Food insecurity    Worry: Not on file    Inability: Not on file  . Transportation needs    Medical: Not on file    Non-medical: Not on file  Tobacco Use  . Smoking status: Former Smoker    Packs/day: 0.25    Years: 3.00    Pack years: 0.75    Types: Cigarettes    Quit date: 06/14/2016    Years since quitting: 2.0  . Smokeless tobacco: Never Used  Substance and Sexual Activity  . 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  . Drug use: No  . Sexual activity: Yes  Lifestyle  . Physical activity    Days per week: Not on file    Minutes per session: Not on file  . Stress: Not on file  Relationships  . Social Musicianconnections    Talks on phone: Not on file    Gets together: Not on file    Attends religious service: Not on file    Active member of club or organization: Not on file  Attends meetings of clubs or organizations: Not on file    Relationship status: Not on file  . Intimate partner violence    Fear of current or ex partner: Not on file    Emotionally abused: Not on file    Physically abused: Not on file    Forced sexual activity: Not on file  Other Topics Concern  . Not on file  Social History Narrative  . Not on file    Past Surgical History:  Procedure Laterality Date  . ACHILLES TENDON REPAIR    . ANTERIOR CRUCIATE LIGAMENT REPAIR    . LUMBAR LAMINECTOMY/DECOMPRESSION MICRODISCECTOMY Right 09/19/2016   Procedure: Right Lumbar Five-Sacral one Laminotomy/Foraminotomy/removal of Synovial cyst;  Surgeon: Barnett AbuElsner, Henry, MD;  [facility];  Service: Neurosurgery;  Laterality: Right;  Right L5-S1 Laminotomy/Foraminotomy/removal of  Synovial cyst  . POSTERIOR CERVICAL LAMINECTOMY N/A 06/12/2016   Procedure: Cervical One Posterior cervical laminectomy;  Surgeon: Barnett AbuElsner, Henry, MD;  [facility];  Service: Neurosurgery;  Laterality: N/A;  posterior  . TONSILLECTOMY      Family History  Problem Relation Age of Onset  . Diabetes Father   . Obesity Father     Allergies  Allergen Reactions  . No Known Allergies     Current Outpatient Medications on File Prior to Visit  Medication Sig Dispense Refill  . acetaminophen (TYLENOL) 500 MG tablet Take 1,000 mg by mouth every 6 (six) hours as needed (for pain.).    Marland Kitchen. diclofenac (VOLTAREN) 75 MG EC tablet TAKE 1 TABLET BY MOUTH TWICE A DAY 60 tablet 0  . Insulin Pen Needle (BD PEN NEEDLE NANO 2ND GEN) 32G X 4 MM MISC 1 Package by Does not apply route 2 (two) times daily. 100 each 0  . losartan (COZAAR) 100 MG tablet TAKE 1 TABLET BY MOUTH EVERY DAY 90 tablet 1  . metFORMIN (GLUCOPHAGE) 500 MG tablet TAKE 1 TABLET BY MOUTH EVERY DAY WITH BREAKFAST 30 tablet 0  . Semaglutide (RYBELSUS) 14 MG TABS Take 14 mg by mouth daily. 30 tablet 0  . Vitamin D, Ergocalciferol, (DRISDOL) 1.25 MG (50000 UT) CAPS capsule Take 1 capsule (50,000 Units total) by mouth every 7 (seven) days. 4 capsule 0   No current facility-administered medications on file prior to visit.     BP 133/77   Pulse 62   Temp 98.3 F (36.8 C) (Oral)   Resp 16   Ht 6' (1.829 m)   Wt (!) 410 lb 6.4 oz (186.2 kg)   SpO2 98%   BMI 55.66 kg/m     Review of Systems     Objective:   Physical Exam  General Appearance- Not in acute distress.    Chest and Lung Exam Auscultation: Breath sounds:-Normal. Clear even and unlabored. Adventitious sounds:- No Adventitious sounds.  Cardiovascular Auscultation:Rythm - Regular, rate and rythm. Heart Sounds -Normal heart sounds.  Abdomen Inspection:-Inspection Normal.  Palpation/Perucssion: Palpation and Percussion of the abdomen reveal- Non Tender, No Rebound  tenderness, No rigidity(Guarding) and No Palpable abdominal masses.  Liver:-Normal.  Spleen:- Normal.   Back Mid lumbar spine tenderness to palpation. Pain on straight leg lift. Pain on lateral movements and flexion/extension of the spine.  Lower ext neurologic  L5-S1 sensation decreased left side.  Normal patellar reflexes bilaterally. No foot drop bilaterally. Pain on palpation left of midline scar lumbar spine. Left leg 4/5 strength. No foot drop.      Assessment & Plan:  For your lower lumbar back pain with radiating features  and some mild weakness, I do want to go ahead and try to get MRI of the lumbar spine as you do have known history of positive findings.  History of surgery and now some recurrent more severe pain again.  We gave you Toradol 60 mg IM injection today.  I want you to start prednisone six-day taper dose pack.  Prescribed Flexeril tablets to use at night.  Also sent prescription of Norco to your pharmacy use as needed for severe pain.  Prescription of meloxicam sent as well but not to use meloxicam so you finish the taper prednisone.  Red flag signs and symptoms reviewed today that would indicate need to go to emergency department.  If MRI denied by insurance then would asked her to notify her neurosurgeons office and see if they could see you ASAP so they could attempt to get the MRI done more quickly.  Follow-up in 10 days or as needed.  25+ minutes spent with patient today.  Percent of time spent counseling on treatment plan and plan going forward in regards to attempted MRI and need for referral back to neurosurgeon Mackie Pai, PA-C

## 2018-07-10 NOTE — Addendum Note (Signed)
Addended by: Hinton Dyer on: 07/10/2018 09:58 AM   Modules accepted: Orders

## 2018-07-12 ENCOUNTER — Telehealth: Payer: Self-pay

## 2018-07-12 NOTE — Telephone Encounter (Signed)
Copied from [place] [phone]. Topic: General - Other >> Jul 10, 2018 11:51 AM Celene Kras A wrote: Reason for CRM: Alyssa, from [facility], called stating she was speaking to someone in office and got disconnected. Called office and nobody had spoken with her. Please advise. Callback # [phone]

## 2018-07-17 ENCOUNTER — Encounter (INDEPENDENT_AMBULATORY_CARE_PROVIDER_SITE_OTHER): Payer: Self-pay | Admitting: Physician Assistant

## 2018-07-17 ENCOUNTER — Other Ambulatory Visit: Payer: Self-pay

## 2018-07-17 ENCOUNTER — Ambulatory Visit (INDEPENDENT_AMBULATORY_CARE_PROVIDER_SITE_OTHER): Payer: Managed Care, Other (non HMO) | Admitting: Physician Assistant

## 2018-07-17 VITALS — BP 136/81 | HR 67 | Temp 97.8°F | Ht 72.0 in | Wt >= 6400 oz

## 2018-07-17 DIAGNOSIS — Z9189 Other specified personal risk factors, not elsewhere classified: Secondary | ICD-10-CM

## 2018-07-17 DIAGNOSIS — E559 Vitamin D deficiency, unspecified: Secondary | ICD-10-CM | POA: Diagnosis not present

## 2018-07-17 DIAGNOSIS — E7849 Other hyperlipidemia: Secondary | ICD-10-CM | POA: Diagnosis not present

## 2018-07-17 DIAGNOSIS — R7303 Prediabetes: Secondary | ICD-10-CM

## 2018-07-17 DIAGNOSIS — Z6841 Body Mass Index (BMI) 40.0 and over, adult: Secondary | ICD-10-CM

## 2018-07-17 MED ORDER — METFORMIN HCL 500 MG PO TABS: ORAL_TABLET | ORAL | 0 refills | Status: DC

## 2018-07-17 MED ORDER — RYBELSUS 14 MG PO TABS: 14.0000 mg | ORAL_TABLET | Freq: Every day | ORAL | 0 refills | Status: DC

## 2018-07-17 MED ORDER — VITAMIN D (ERGOCALCIFEROL) 1.25 MG (50000 UNIT) PO CAPS: 50000.0000 [IU] | ORAL_CAPSULE | ORAL | 0 refills | Status: DC

## 2018-07-17 NOTE — Progress Notes (Signed)
Office: [phone]  /  Fax: [phone]   HPI:   Chief Complaint: OBESITY Louis [name] is here to discuss his progress with his obesity treatment plan. He is on a modified Category 3/4 plan and is following his eating plan approximately 90% of the time. He states he is exercising 0 minutes 0 times per week. Louis [name] reports that he has had a lot of back pain due to a cyst pressing on his spine and is seeing Neurosurgery. He states his hunger is controlled and he is following the plan.  His weight is (!) 405 lb (183.7 kg) today and has had a weight gain of 10 lbs since his last in-office visit 4 months ago. He has lost 1 lb since starting treatment with us.  Pre-Diabetes Louis [name] has a diagnosis of prediabetes based on his elevated Hgb A1c and was informed this puts him at greater risk of developing diabetes. He is on Rybelsus and metformin currently and continues to work on diet and exercise to decrease risk of diabetes. He denies nausea, vomiting, diarrhea, or polyphagia.  At risk for diabetes Louis [name] is at higher than averagerisk for developing diabetes due to his obesity. He currently denies polyuria or polydipsia.  Vitamin D deficiency Louis [name] has a diagnosis of Vitamin D deficiency. He is currently taking prescription Vit D and denies nausea, vomiting or muscle weakness.  Decreased Hyperlipidemia Louis [name] has hyperlipidemia and has been trying to improve his cholesterol levels with intensive lifestyle modification including a low saturated fat diet, exercise and weight loss. He is on no medication and denies any chest pain.   ASSESSMENT AND PLAN:  Prediabetes - Plan: Comprehensive metabolic panel, Hemoglobin A1c, Insulin, random, metFORMIN (GLUCOPHAGE) 500 MG tablet, Semaglutide (RYBELSUS) 14 MG TABS  Vitamin D deficiency - Plan: VITAMIN D 25 Hydroxy (Vit-D Deficiency, Fractures), Vitamin D, Ergocalciferol, (DRISDOL) 1.25 MG (50000 UT) CAPS capsule  Other hyperlipidemia - Plan: Lipid Panel With  LDL/HDL Ratio  At risk for diabetes mellitus  Class 3 severe obesity with serious comorbidity and body mass index (BMI) of 50.0 to 59.9 in adult, unspecified obesity type (HCC)  PLAN:  Pre-Diabetes Louis [name] will continue to work on weight loss, exercise, and decreasing simple carbohydrates in his diet to help decrease the risk of diabetes. We dicussed metformin including benefits and risks. He was informed that eating too many simple carbohydrates or too many calories at one sitting increases the likelihood of GI side effects. Louis [name] was given a refill on his Rybelsus #30 with 0 refills and a refill on his metformin #30 with 0 refills. He agrees to follow-up with our clinic in 2-3 weeks.  Diabetes risk counseling Louis [name] was given extended (15 minutes) diabetes prevention counseling today. He is 43 y.o. male and has risk factors for diabetes including obesity. We discussed intensive lifestyle modifications today with an emphasis on weight loss as well as increasing exercise and decreasing simple carbohydrates in his diet.  Vitamin D Deficiency Louis [name] was informed that low Vitamin D levels contributes to fatigue and are associated with obesity, breast, and colon cancer. He agrees to continue to take prescription Vit D @ 50,000 IU every week #4 with 0 refils and will follow-up for routine testing of Vitamin D, at least 2-3 times per year. He was informed of the risk of over-replacement of Vitamin D and agrees to not increase his dose unless he discusses this with us first. Louis [name] agrees to follow-up with our clinic in 2-3 weeks.  Hyperlipidemia Louis [name] was informed of the  American Heart Association Guidelines emphasizing intensive lifestyle modifications as the first line treatment for hyperlipidemia. We discussed many lifestyle modifications today in depth, and Trayce will continue to work on decreasing saturated fats such as fatty red meat, butter and many fried foods. He will have labs checked, increase  vegetables and lean protein in his diet, and continue to work on exercise and weight loss efforts.  Obesity Niv is currently in the action stage of change. As such, his goal is to continue with weight loss efforts. He will change and follow the Category 4 plan. Nethaniel has been instructed to work up to a goal of 150 minutes of combined cardio and strengthening exercise per week for weight loss and overall health benefits. We discussed the following Behavioral Modification Strategies today: work on meal planning and easy cooking plans, and keeping healthy foods in the home.  Matson has agreed to follow-up with our clinic in 2-3 weeks. He was informed of the importance of frequent follow-up visits to maximize his success with intensive lifestyle modifications for his multiple health conditions.  ALLERGIES: Allergies  Allergen Reactions   No Known Allergies     MEDICATIONS: Current Outpatient Medications on File Prior to Visit  Medication Sig Dispense Refill   acetaminophen (TYLENOL) 500 MG tablet Take 1,000 mg by mouth every 6 (six) hours as needed (for pain.).     cyclobenzaprine (FLEXERIL) 10 MG tablet Take 1 tablet (10 mg total) by mouth at bedtime. 30 tablet 0   diclofenac (VOLTAREN) 75 MG EC tablet TAKE 1 TABLET BY MOUTH TWICE A DAY 60 tablet 0   HYDROcodone-acetaminophen (NORCO) 5-325 MG tablet Take 1 tablet by mouth every 6 (six) hours as needed for moderate pain. 30 tablet 0   losartan (COZAAR) 100 MG tablet TAKE 1 TABLET BY MOUTH EVERY DAY 90 tablet 1   meloxicam (MOBIC) 7.5 MG tablet Take 1 tablet (7.5 mg total) by mouth daily. 30 tablet 0   No current facility-administered medications on file prior to visit.     PAST MEDICAL HISTORY: Past Medical History:  Diagnosis Date   Allergy    Arthritis    in both knees   GERD (gastroesophageal reflux disease)    Headache    HTN (hypertension)    Obesity     PAST SURGICAL HISTORY: Past Surgical History:    Procedure Laterality Date   ACHILLES TENDON REPAIR     ANTERIOR CRUCIATE LIGAMENT REPAIR     LUMBAR LAMINECTOMY/DECOMPRESSION MICRODISCECTOMY Right 09/19/2016   Procedure: Right Lumbar Five-Sacral one Laminotomy/Foraminotomy/removal of Synovial cyst;  Surgeon: Kristeen Miss, MD;  Location: [place];  Service: Neurosurgery;  Laterality: Right;  Right L5-S1 Laminotomy/Foraminotomy/removal of Synovial cyst   POSTERIOR CERVICAL LAMINECTOMY N/A 06/12/2016   Procedure: Cervical One Posterior cervical laminectomy;  Surgeon: Kristeen Miss, MD;  Location: [place];  Service: Neurosurgery;  Laterality: N/A;  posterior   TONSILLECTOMY      SOCIAL HISTORY: Social History   Tobacco Use   Smoking status: Former Smoker    Packs/day: 0.25    Years: 3.00    Pack years: 0.75    Types: Cigarettes    Quit date: 06/14/2016    Years since quitting: 2.0   Smokeless tobacco: Never Used  Substance Use Topics   Alcohol use: Yes    Alcohol/week: 3.0 standard drinks    Types: 3 Cans of beer per week    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   Drug use: No  FAMILY HISTORY: Family History  Problem Relation Age of Onset   Diabetes Father    Obesity Father    ROS: Review of Systems  Cardiovascular: Negative for chest pain.  Gastrointestinal: Negative for diarrhea, nausea and vomiting.  Musculoskeletal:       Negative for muscle weakness.  Endo/Heme/Allergies:       Negative for polyphagia.   PHYSICAL EXAM: Blood pressure 136/81, pulse 67, temperature 97.8 F (36.6 C), temperature source Oral, height 6' (1.829 m), weight (!) 405 lb (183.7 kg), SpO2 98 %. Body mass index is 54.93 kg/m. Physical Exam Vitals signs reviewed.  Constitutional:      Appearance: Normal appearance. He is obese.  Cardiovascular:     Rate and Rhythm: Normal rate.     Pulses: Normal pulses.  Pulmonary:     Effort: Pulmonary effort is normal.     Breath sounds: Normal breath sounds.  Musculoskeletal: Normal  range of motion.  Skin:    General: Skin is warm and dry.  Neurological:     Mental Status: He is alert and oriented to person, place, and time.  Psychiatric:        Behavior: Behavior normal.   RECENT LABS AND TESTS: BMET    Component Value Date/Time   NA 141 10/30/2017 0803   K 4.3 10/30/2017 0803   CL 101 10/30/2017 0803   CO2 26 10/30/2017 0803   GLUCOSE 90 10/30/2017 0803   GLUCOSE 98 07/16/2017 0911   BUN 9 10/30/2017 0803   CREATININE 0.98 10/30/2017 0803   CREATININE 0.94 11/15/2015 0833   CALCIUM 9.0 10/30/2017 0803   GFRNONAA 95 10/30/2017 0803   GFRNONAA >89 11/15/2015 0833   GFRAA 109 10/30/2017 0803   GFRAA >89 11/15/2015 0833   Lab Results  Component Value Date   HGBA1C 5.7 (H) 10/30/2017   HGBA1C 5.9 07/16/2017   HGBA1C 5.9 (H) 03/08/2017   HGBA1C 5.8 11/16/2015   Lab Results  Component Value Date   INSULIN 15.6 10/30/2017   INSULIN 18.0 03/08/2017   CBC    Component Value Date/Time   WBC 8.6 03/08/2017 0932   WBC 8.8 09/14/2016 1140   RBC 4.63 03/08/2017 0932   RBC 4.89 09/14/2016 1140   HGB 13.2 03/08/2017 0932   HCT 41.5 03/08/2017 0932   PLT 285 03/08/2017 0932   MCV 90 03/08/2017 0932   MCH 28.5 03/08/2017 0932   MCH 27.8 09/14/2016 1140   MCHC 31.8 03/08/2017 0932   MCHC 31.8 09/14/2016 1140   RDW 14.7 03/08/2017 0932   LYMPHSABS 2.4 03/08/2017 0932   MONOABS 0.4 11/15/2015 0833   EOSABS 0.1 03/08/2017 0932   BASOSABS 0.0 03/08/2017 0932   Iron/TIBC/Ferritin/ %Sat No results found for: IRON, TIBC, FERRITIN, IRONPCTSAT Lipid Panel     Component Value Date/Time   CHOL 140 10/30/2017 0803   TRIG 54 10/30/2017 0803   HDL 43 10/30/2017 0803   CHOLHDL 3 07/16/2017 0911   VLDL 13.0 07/16/2017 0911   LDLCALC 86 10/30/2017 0803   Hepatic Function Panel     Component Value Date/Time   PROT 7.6 10/30/2017 0803   ALBUMIN 4.1 10/30/2017 0803   AST 21 10/30/2017 0803   ALT 22 10/30/2017 0803   ALKPHOS 90 10/30/2017 0803   BILITOT  0.3 10/30/2017 0803      Component Value Date/Time   TSH 1.260 03/08/2017 0932   TSH 1.07 11/15/2015 0833   Results for Janeece RiggersLEWIS, Deontray DAMON (MRN [id number]) as of 07/17/2018 11:46  Ref.  Range 10/30/2017 08:03  Vitamin D, 25-Hydroxy Latest Ref Range: 30.0 - 100.0 ng/mL 33.9   OBESITY BEHAVIORAL INTERVENTION VISIT  Today's visit was #26  Starting weight: 406 lbs Starting date: 03/08/2017 Today's weight: 405 lbs  Today's date: 07/17/2018 Total lbs lost to date: 1   07/17/2018  Height 6' (1.829 m)  Weight 405 lb (183.7 kg) (A)  BMI (Calculated) 54.92  BLOOD PRESSURE - SYSTOLIC 136  BLOOD PRESSURE - DIASTOLIC 81   Body Fat % 45.5 %  Total Body Water (lbs) 163 lbs   ASK: We discussed the diagnosis of obesity with Lonell GrandchildAvery Damon Caprara today and Louis PeonAvery agreed to give us permission to discuss obesity behavioral modification therapy today.  ASSESS: Louis [name] has the diagnosis of obesity and his BMI today is 55.0. Louis [name] is in the action stage of change.   ADVISE: Louis [name] was educated on the multiple health risks of obesity as well as the benefit of weight loss to improve his health. He was advised of the need for long term treatment and the importance of lifestyle modifications to improve his current health and to decrease his risk of future health problems.  AGREE: Multiple dietary modification options and treatment options were discussed and  Louis [name] agreed to follow the recommendations documented in the above note.  ARRANGE: Louis [name] was educated on the importance of frequent visits to treat obesity as outlined per CMS and USPSTF guidelines and agreed to schedule his next follow up appointment today.  Fernanda DrumI, Denise Haag, am acting as transcriptionist for Alois Clicheracey Aguilar, PA-C I, Alois Clicheracey Aguilar, PA-C have reviewed above note and agree with its content

## 2018-07-18 ENCOUNTER — Other Ambulatory Visit (INDEPENDENT_AMBULATORY_CARE_PROVIDER_SITE_OTHER): Payer: Self-pay | Admitting: Physician Assistant

## 2018-07-18 DIAGNOSIS — E559 Vitamin D deficiency, unspecified: Secondary | ICD-10-CM

## 2018-07-18 LAB — LIPID PANEL WITH LDL/HDL RATIO
Cholesterol, Total: 137 mg/dL (ref 100–199)
HDL: 56 mg/dL (ref >39)
LDL Calculated: 69 mg/dL (ref 0–99)
LDl/HDL Ratio: 1.2 ratio (ref 0.0–3.6)
Triglycerides: 62 mg/dL (ref 0–149)
VLDL Cholesterol Cal: 12 mg/dL (ref 5–40)

## 2018-07-18 LAB — COMPREHENSIVE METABOLIC PANEL
ALT: 25 IU/L (ref 0–44)
AST: 24 IU/L (ref 0–40)
Albumin/Globulin Ratio: 1.2 (ref 1.2–2.2)
Albumin: 3.9 g/dL — ABNORMAL LOW (ref 4.0–5.0)
Alkaline Phosphatase: 84 IU/L (ref 39–117)
BUN/Creatinine Ratio: 13 (ref 9–20)
BUN: 15 mg/dL (ref 6–24)
Bilirubin Total: 0.3 mg/dL (ref 0.0–1.2)
CO2: 27 mmol/L (ref 20–29)
Calcium: 9.1 mg/dL (ref 8.7–10.2)
Chloride: 100 mmol/L (ref 96–106)
Creatinine, Ser: 1.15 mg/dL (ref 0.76–1.27)
GFR calc Af Amer: 90 mL/min/{1.73_m2} (ref >59)
GFR calc non Af Amer: 78 mL/min/{1.73_m2} (ref >59)
Globulin, Total: 3.2 g/dL (ref 1.5–4.5)
Glucose: 93 mg/dL (ref 65–99)
Potassium: 4.8 mmol/L (ref 3.5–5.2)
Sodium: 141 mmol/L (ref 134–144)
Total Protein: 7.1 g/dL (ref 6.0–8.5)

## 2018-07-18 LAB — INSULIN, RANDOM: INSULIN: 11.7 u[IU]/mL (ref 2.6–24.9)

## 2018-07-18 LAB — HEMOGLOBIN A1C
Est. average glucose Bld gHb Est-mCnc: 114 mg/dL
Hgb A1c MFr Bld: 5.6 % (ref 4.8–5.6)

## 2018-07-18 LAB — VITAMIN D 25 HYDROXY (VIT D DEFICIENCY, FRACTURES): Vit D, 25-Hydroxy: 38 ng/mL (ref 30.0–100.0)

## 2018-07-20 ENCOUNTER — Other Ambulatory Visit (INDEPENDENT_AMBULATORY_CARE_PROVIDER_SITE_OTHER): Payer: Self-pay | Admitting: Physician Assistant

## 2018-07-20 DIAGNOSIS — R7303 Prediabetes: Secondary | ICD-10-CM

## 2018-07-29 ENCOUNTER — Other Ambulatory Visit: Payer: Self-pay | Admitting: Medical

## 2018-07-29 MED ORDER — CYCLOBENZAPRINE HCL 10 MG PO TABS: 10.0000 mg | ORAL_TABLET | Freq: Every day | ORAL | 0 refills | Status: DC

## 2018-07-29 MED ORDER — HYDROCODONE-ACETAMINOPHEN 5-325 MG PO TABS: 1.0000 | ORAL_TABLET | Freq: Four times a day (QID) | ORAL | 0 refills | Status: DC | PRN

## 2018-07-29 NOTE — Telephone Encounter (Signed)
Have pt scheudule appointment with me this week or 2nd week of august. I sent  In flexeril and hydrocodone to his pharmacy. But need to discuss his level of pain and possibility of putting him on contract if he requires further refills of hydrocodone.  Appointment could be virtual or phone.

## 2018-07-30 NOTE — Telephone Encounter (Signed)
Pt is already scheduled 08/07/18

## 2018-08-02 ENCOUNTER — Other Ambulatory Visit: Payer: Self-pay | Admitting: Medical

## 2018-08-07 ENCOUNTER — Ambulatory Visit: Payer: Managed Care, Other (non HMO) | Admitting: Medical

## 2018-08-07 ENCOUNTER — Ambulatory Visit (INDEPENDENT_AMBULATORY_CARE_PROVIDER_SITE_OTHER): Payer: Managed Care, Other (non HMO) | Admitting: Physician Assistant

## 2018-08-07 ENCOUNTER — Other Ambulatory Visit: Payer: Self-pay

## 2018-08-07 VITALS — BP 137/85 | HR 73 | Temp 98.7°F | Ht 72.0 in | Wt 397.0 lb

## 2018-08-07 DIAGNOSIS — E559 Vitamin D deficiency, unspecified: Secondary | ICD-10-CM | POA: Diagnosis not present

## 2018-08-07 DIAGNOSIS — Z6841 Body Mass Index (BMI) 40.0 and over, adult: Secondary | ICD-10-CM

## 2018-08-07 NOTE — Progress Notes (Signed)
Office: [phone]  /  Fax: [phone]   HPI:   Chief Complaint: OBESITY Louis [name] is here to discuss his progress with his obesity treatment plan. He is on the Category 4 plan and is following his eating plan approximately 90 % of the time. He states he is walking 1 mile 1 time per week. Louis [name] reports that he has been out of work due to back pain for the last 2 weeks. During that time, he ate cloer to category 3 because his activity was lighter. He denies excessive hunger. His weight is (!) 397 lb (180.1 kg) today and has had a weight loss of 8 pounds over a period of 3 weeks since his last visit. He has lost 9 lbs since starting treatment with us.  Vitamin D Deficiency Louis [name] has a diagnosis of vitamin D deficiency. He is currently taking prescription Vit D and denies nausea, vomiting or muscle weakness.  ASSESSMENT AND PLAN:  Vitamin D deficiency  Class 3 severe obesity with serious comorbidity and body mass index (BMI) of 50.0 to 59.9 in adult, unspecified obesity type (HCC)  PLAN:  Vitamin D Deficiency Louis [name] was informed that low vitamin D levels contributes to fatigue and are associated with obesity, breast, and colon cancer. Louis [name] agrees to continue taking prescription Vit D 50,000 IU every week and will follow up for routine testing of vitamin D, at least 2-3 times per year. He was informed of the risk of over-replacement of vitamin D and agrees to not increase his dose unless he discusses this with us first. Louis [name] agrees to follow up with our clinic in 3 weeks.  I spent > than 50% of the 15 minute visit on counseling as documented in the note.  Obesity Louis [name] is currently in the action stage of change. As such, his goal is to continue with weight loss efforts He has agreed to follow the Category 4 plan Louis [name] has been instructed to work up to a goal of 150 minutes of combined cardio and strengthening exercise per week for weight loss and overall health benefits. We discussed  the following Behavioral Modification Strategies today: work on meal planning and easy cooking plans and keeping healthy foods in the home   Louis [name] has agreed to follow up with our clinic in 3 weeks. He was informed of the importance of frequent follow up visits to maximize his success with intensive lifestyle modifications for his multiple health conditions.  ALLERGIES: Allergies  Allergen Reactions  . No Known Allergies     MEDICATIONS: Current Outpatient Medications on File Prior to Visit  Medication Sig Dispense Refill  . acetaminophen (TYLENOL) 500 MG tablet Take 1,000 mg by mouth every 6 (six) hours as needed (for pain.).    Marland Kitchen. cyclobenzaprine (FLEXERIL) 10 MG tablet Take 1 tablet (10 mg total) by mouth at bedtime. 30 tablet 0  . diclofenac (VOLTAREN) 75 MG EC tablet TAKE 1 TABLET BY MOUTH TWICE A DAY 60 tablet 0  . HYDROcodone-acetaminophen (NORCO) 5-325 MG tablet Take 1 tablet by mouth every 6 (six) hours as needed for moderate pain. 30 tablet 0  . losartan (COZAAR) 100 MG tablet TAKE 1 TABLET BY MOUTH EVERY DAY 90 tablet 1  . meloxicam (MOBIC) 7.5 MG tablet TAKE 1 TABLET BY MOUTH EVERY DAY 30 tablet 0  . metFORMIN (GLUCOPHAGE) 500 MG tablet TAKE 1 TABLET BY MOUTH EVERY DAY WITH BREAKFAST 30 tablet 0  . Semaglutide (RYBELSUS) 14 MG TABS Take 14 mg by mouth daily. 30 tablet  0  . Vitamin D, Ergocalciferol, (DRISDOL) 1.25 MG (50000 UT) CAPS capsule Take 1 capsule (50,000 Units total) by mouth every 7 (seven) days. 4 capsule 0   No current facility-administered medications on file prior to visit.     PAST MEDICAL HISTORY: Past Medical History:  Diagnosis Date  . Allergy   . Arthritis    in both knees  . GERD (gastroesophageal reflux disease)   . Headache   . HTN (hypertension)   . Obesity     PAST SURGICAL HISTORY: Past Surgical History:  Procedure Laterality Date  . ACHILLES TENDON REPAIR    . ANTERIOR CRUCIATE LIGAMENT REPAIR    . LUMBAR LAMINECTOMY/DECOMPRESSION  MICRODISCECTOMY Right 09/19/2016   Procedure: Right Lumbar Five-Sacral one Laminotomy/Foraminotomy/removal of Synovial cyst;  Surgeon: Kristeen Miss, MD;  Location: [place];  Service: Neurosurgery;  Laterality: Right;  Right L5-S1 Laminotomy/Foraminotomy/removal of Synovial cyst  . POSTERIOR CERVICAL LAMINECTOMY N/A 06/12/2016   Procedure: Cervical One Posterior cervical laminectomy;  Surgeon: Kristeen Miss, MD;  Location: [place];  Service: Neurosurgery;  Laterality: N/A;  posterior  . TONSILLECTOMY      SOCIAL HISTORY: Social History   Tobacco Use  . Smoking status: Former Smoker    Packs/day: 0.25    Years: 3.00    Pack years: 0.75    Types: Cigarettes    Quit date: 06/14/2016    Years since quitting: 2.1  . Smokeless tobacco: Never Used  Substance Use Topics  . 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  . Drug use: No    FAMILY HISTORY: Family History  Problem Relation Age of Onset  . Diabetes Father   . Obesity Father     ROS: Review of Systems  Constitutional: Positive for weight loss.  Gastrointestinal: Negative for nausea and vomiting.  Musculoskeletal: Positive for back pain.       Negative muscle weakness    PHYSICAL EXAM: Blood pressure 137/85, pulse 73, temperature 98.7 F (37.1 C), temperature source Oral, height 6' (1.829 m), weight (!) 397 lb (180.1 kg), SpO2 97 %. Body mass index is 53.84 kg/m. Physical Exam Vitals signs reviewed.  Constitutional:      Appearance: Normal appearance. He is obese.  Cardiovascular:     Rate and Rhythm: Normal rate.     Pulses: Normal pulses.  Pulmonary:     Effort: Pulmonary effort is normal.     Breath sounds: Normal breath sounds.  Musculoskeletal: Normal range of motion.  Skin:    General: Skin is warm and dry.  Neurological:     Mental Status: He is alert and oriented to person, place, and time.  Psychiatric:         Mood and Affect: Mood normal.        Behavior: Behavior normal.     RECENT LABS AND TESTS: BMET    Component Value Date/Time   NA 141 07/17/2018 0849   K 4.8 07/17/2018 0849   CL 100 07/17/2018 0849   CO2 27 07/17/2018 0849   GLUCOSE 93 07/17/2018 0849   GLUCOSE 98 07/16/2017 0911   BUN 15 07/17/2018 0849   CREATININE 1.15 07/17/2018 0849   CREATININE 0.94 11/15/2015 0833   CALCIUM 9.1 07/17/2018 0849   GFRNONAA 78 07/17/2018 0849   GFRNONAA >89 11/15/2015 0833   GFRAA 90 07/17/2018 0849   GFRAA >89 11/15/2015  [id number]   Lab Results  Component Value Date   HGBA1C 5.6 07/17/2018   HGBA1C 5.7 (H) 10/30/2017   HGBA1C 5.9 07/16/2017   HGBA1C 5.9 (H) 03/08/2017   HGBA1C 5.8 11/16/2015   Lab Results  Component Value Date   INSULIN 11.7 07/17/2018   INSULIN 15.6 10/30/2017   INSULIN 18.0 03/08/2017   CBC    Component Value Date/Time   WBC 8.6 03/08/2017 0932   WBC 8.8 09/14/2016 1140   RBC 4.63 03/08/2017 0932   RBC 4.89 09/14/2016 1140   HGB 13.2 03/08/2017 0932   HCT 41.5 03/08/2017 0932   PLT 285 03/08/2017 0932   MCV 90 03/08/2017 0932   MCH 28.5 03/08/2017 0932   MCH 27.8 09/14/2016 1140   MCHC 31.8 03/08/2017 0932   MCHC 31.8 09/14/2016 1140   RDW 14.7 03/08/2017 0932   LYMPHSABS 2.4 03/08/2017 0932   MONOABS 0.4 11/15/2015 0833   EOSABS 0.1 03/08/2017 0932   BASOSABS 0.0 03/08/2017 0932   Iron/TIBC/Ferritin/ %Sat No results found for: IRON, TIBC, FERRITIN, IRONPCTSAT Lipid Panel     Component Value Date/Time   CHOL 137 07/17/2018 0849   TRIG 62 07/17/2018 0849   HDL 56 07/17/2018 0849   CHOLHDL 3 07/16/2017 0911   VLDL 13.0 07/16/2017 0911   LDLCALC 69 07/17/2018 0849   Hepatic Function Panel     Component Value Date/Time   PROT 7.1 07/17/2018 0849   ALBUMIN 3.9 (L) 07/17/2018 0849   AST 24 07/17/2018 0849   ALT 25 07/17/2018 0849   ALKPHOS 84 07/17/2018 0849   BILITOT 0.3 07/17/2018 0849      Component Value Date/Time   TSH 1.260  03/08/2017 0932   TSH 1.07 11/15/2015 0833      OBESITY BEHAVIORAL INTERVENTION VISIT  Today's visit was # 27   Starting weight: 406 lbs Starting date: 03/08/17 Today's weight : 397 lbs Today's date: 08/07/2018 Total lbs lost to date: 9    ASK: We discussed the diagnosis of obesity with Louis GrandchildAvery Damon [name] today and Louis PeonAvery agreed to give us permission to discuss obesity behavioral modification therapy today.  ASSESS: Louis [name] has the diagnosis of obesity and his BMI today is 53.83 Louis [name] is in the action stage of change   ADVISE: Louis [name] was educated on the multiple health risks of obesity as well as the benefit of weight loss to improve his health. He was advised of the need for long term treatment and the importance of lifestyle modifications to improve his current health and to decrease his risk of future health problems.  AGREE: Multiple dietary modification options and treatment options were discussed and  Louis [name] agreed to follow the recommendations documented in the above note.  ARRANGE: Louis [name] was educated on the importance of frequent visits to treat obesity as outlined per CMS and USPSTF guidelines and agreed to schedule his next follow up appointment today.  Trude McburneyI, Jerolene Kupfer, am acting as transcriptionist for Alois Clicheracey Aguilar, PA-C

## 2018-08-08 ENCOUNTER — Other Ambulatory Visit (INDEPENDENT_AMBULATORY_CARE_PROVIDER_SITE_OTHER): Payer: Self-pay | Admitting: Physician Assistant

## 2018-08-08 DIAGNOSIS — R7303 Prediabetes: Secondary | ICD-10-CM

## 2018-08-11 ENCOUNTER — Other Ambulatory Visit (INDEPENDENT_AMBULATORY_CARE_PROVIDER_SITE_OTHER): Payer: Self-pay | Admitting: Physician Assistant

## 2018-08-11 ENCOUNTER — Other Ambulatory Visit: Payer: Managed Care, Other (non HMO)

## 2018-08-11 ENCOUNTER — Ambulatory Visit
Admission: RE | Admit: 2018-08-11 | Discharge: 2018-08-11 | Disposition: A | Discharge status: Home / Self Care | Payer: No Typology Code available for payment source | Source: Ambulatory Visit | Attending: Medical | Admitting: Medical

## 2018-08-11 ENCOUNTER — Other Ambulatory Visit: Payer: Self-pay

## 2018-08-11 DIAGNOSIS — M5416 Radiculopathy, lumbar region: Secondary | ICD-10-CM

## 2018-08-11 DIAGNOSIS — R29898 Other symptoms and signs involving the musculoskeletal system: Secondary | ICD-10-CM

## 2018-08-11 DIAGNOSIS — E559 Vitamin D deficiency, unspecified: Secondary | ICD-10-CM

## 2018-08-13 ENCOUNTER — Other Ambulatory Visit (INDEPENDENT_AMBULATORY_CARE_PROVIDER_SITE_OTHER): Payer: Self-pay | Admitting: Physician Assistant

## 2018-08-13 DIAGNOSIS — R7303 Prediabetes: Secondary | ICD-10-CM

## 2018-08-22 ENCOUNTER — Other Ambulatory Visit (INDEPENDENT_AMBULATORY_CARE_PROVIDER_SITE_OTHER): Payer: Self-pay | Admitting: Physician Assistant

## 2018-08-22 DIAGNOSIS — R7303 Prediabetes: Secondary | ICD-10-CM

## 2018-08-22 DIAGNOSIS — E559 Vitamin D deficiency, unspecified: Secondary | ICD-10-CM

## 2018-08-28 ENCOUNTER — Encounter (INDEPENDENT_AMBULATORY_CARE_PROVIDER_SITE_OTHER): Payer: Self-pay | Admitting: Physician Assistant

## 2018-08-28 ENCOUNTER — Telehealth (INDEPENDENT_AMBULATORY_CARE_PROVIDER_SITE_OTHER): Payer: Self-pay | Admitting: Physician Assistant

## 2018-08-28 ENCOUNTER — Other Ambulatory Visit: Payer: Self-pay

## 2018-08-28 DIAGNOSIS — E559 Vitamin D deficiency, unspecified: Secondary | ICD-10-CM

## 2018-08-28 DIAGNOSIS — Z6841 Body Mass Index (BMI) 40.0 and over, adult: Secondary | ICD-10-CM

## 2018-08-29 NOTE — Progress Notes (Signed)
Office: [phone]  /  Fax: [phone] TeleHealth Visit:  Louis [name] has verbally consented to this TeleHealth visit today. The patient is located at home, the provider is located at the [facility] office. The participants in this visit include the listed provider and patient. The visit was conducted today via Webex (25 minutes).  HPI:   Chief Complaint: OBESITY Louis [name] is here to discuss his progress with his obesity treatment plan. He is on the Category 3 plan and is following his eating plan approximately 95% of the time. He states he is walking 1 mile 2 times per week. 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  He is awaiting results of his back MRI.  We were unable to weigh the patient today for this TeleHealth visit. He feels as if he has maintained his weight since his last visit. He has lost 9 lbs since starting treatment with [name].  Vitamin D deficiency Louis [name] has a diagnosis of Vitamin D deficiency. He is currently taking Vit D and denies nausea, vomiting or muscle weakness.  ASSESSMENT AND PLAN:  Vitamin D deficiency  Class 3 severe obesity with serious comorbidity and body mass index (BMI) of 50.0 to 59.9 in adult, unspecified obesity type (HCC)  PLAN:  Vitamin D Deficiency Louis [name] was informed that low Vitamin D levels contributes to fatigue and are associated with obesity, breast, and colon cancer. He agrees to continue taking Vit D and will follow-up for routine testing of Vitamin D, at least 2-3 times per year. He was informed of the risk of over-replacement of Vitamin D and agrees to not increase his dose unless he discusses this with [name] first. Louis [name] agrees to follow-up with our clinic in 2 weeks.  Obesity Louis [name] is currently in the action stage of change. As such, his goal is to continue with weight loss efforts He has agreed to follow the Category  3 plan Louis [name] has been instructed to work up to a goal of 150 minutes of combined cardio and strengthening exercise per week for weight loss and overall health benefits. We discussed the following Behavioral Modification Stratagies today: work on meal planning and easy cooking plans and planning for success.  Louis [name] has agreed to follow-up with our clinic in 2 weeks. He was informed of the importance of frequent follow-up visits to maximize his success with intensive lifestyle modifications for his multiple health conditions.  I spent > than 50% of the 25 minute visit on counseling as documented in the note.    ALLERGIES: Allergies  Allergen Reactions   No Known Allergies     MEDICATIONS: Current Outpatient Medications on File Prior to Visit  Medication Sig Dispense Refill   acetaminophen (TYLENOL) 500 MG tablet Take 1,000 mg by mouth every 6 (six) hours as needed (for pain.).     cyclobenzaprine (FLEXERIL) 10 MG tablet Take 1 tablet (10 mg total) by mouth at bedtime. 30 tablet 0   diclofenac (VOLTAREN) 75 MG EC tablet TAKE 1 TABLET BY MOUTH TWICE A DAY 60 tablet 0   HYDROcodone-acetaminophen (NORCO) 5-325 MG tablet Take 1 tablet by mouth every 6 (six) hours as needed for moderate pain. 30 tablet 0   losartan (COZAAR) 100 MG tablet TAKE 1 TABLET BY MOUTH EVERY DAY 90 tablet 1   meloxicam (MOBIC) 7.5 MG tablet TAKE 1 TABLET BY MOUTH EVERY DAY 30 tablet  0   metFORMIN (GLUCOPHAGE) 500 MG tablet TAKE 1 TABLET BY MOUTH EVERY DAY WITH BREAKFAST 30 tablet 0   Semaglutide (RYBELSUS) 14 MG TABS Take 14 mg by mouth daily. 30 tablet 0   Vitamin D, Ergocalciferol, (DRISDOL) 1.25 MG (50000 UT) CAPS capsule Take 1 capsule (50,000 Units total) by mouth every 7 (seven) days. 4 capsule 0   No current facility-administered medications on file prior to visit.     PAST MEDICAL HISTORY: Past Medical History:  Diagnosis Date   Allergy    Arthritis    in both knees   GERD (gastroesophageal  reflux disease)    Headache    HTN (hypertension)    Obesity     PAST SURGICAL HISTORY: Past Surgical History:  Procedure Laterality Date   ACHILLES TENDON REPAIR     ANTERIOR CRUCIATE LIGAMENT REPAIR     LUMBAR LAMINECTOMY/DECOMPRESSION MICRODISCECTOMY Right 09/19/2016   Procedure: Right Lumbar Five-Sacral one Laminotomy/Foraminotomy/removal of Synovial cyst;  Surgeon: Kristeen Miss, MD;  Location: [place];  Service: Neurosurgery;  Laterality: Right;  Right L5-S1 Laminotomy/Foraminotomy/removal of Synovial cyst   POSTERIOR CERVICAL LAMINECTOMY N/A 06/12/2016   Procedure: Cervical One Posterior cervical laminectomy;  Surgeon: Kristeen Miss, MD;  Location: [place];  Service: Neurosurgery;  Laterality: N/A;  posterior   TONSILLECTOMY      SOCIAL HISTORY: Social History   Tobacco Use   Smoking status: Former Smoker    Packs/day: 0.25    Years: 3.00    Pack years: 0.75    Types: Cigarettes    Quit date: 06/14/2016    Years since quitting: 2.2   Smokeless tobacco: Never Used  Substance Use Topics   Alcohol use: Yes    Alcohol/week: 3.0 standard drinks    Types: 3 Cans of beer per week    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   Drug use: No    FAMILY HISTORY: Family History  Problem Relation Age of Onset   Diabetes Father    Obesity Father    ROS: Review of Systems  Gastrointestinal: Negative for nausea and vomiting.  Musculoskeletal:       Negative for muscle weakness.   PHYSICAL EXAM: Pt in no acute distress  RECENT LABS AND TESTS: BMET    Component Value Date/Time   NA 141 07/17/2018 0849   K 4.8 07/17/2018 0849   CL 100 07/17/2018 0849   CO2 27 07/17/2018 0849   GLUCOSE 93 07/17/2018 0849   GLUCOSE 98 07/16/2017 0911   BUN 15 07/17/2018 0849   CREATININE 1.15 07/17/2018 0849   CREATININE 0.94 11/15/2015 0833   CALCIUM 9.1 07/17/2018 0849   GFRNONAA 78 07/17/2018 0849   GFRNONAA >89 11/15/2015 0833   GFRAA 90 07/17/2018 0849   GFRAA >89  11/15/2015 0833   Lab Results  Component Value Date   HGBA1C 5.6 07/17/2018   HGBA1C 5.7 (H) 10/30/2017   HGBA1C 5.9 07/16/2017   HGBA1C 5.9 (H) 03/08/2017   HGBA1C 5.8 11/16/2015   Lab Results  Component Value Date   INSULIN 11.7 07/17/2018   INSULIN 15.6 10/30/2017   INSULIN 18.0 03/08/2017   CBC    Component Value Date/Time   WBC 8.6 03/08/2017 0932   WBC 8.8 09/14/2016 1140   RBC 4.63 03/08/2017 0932   RBC 4.89 09/14/2016 1140   HGB 13.2 03/08/2017 0932   HCT 41.5 03/08/2017 0932   PLT 285 03/08/2017 0932   MCV 90 03/08/2017 0932   MCH 28.5 03/08/2017  0932   MCH 27.8 09/14/2016 1140   MCHC 31.8 03/08/2017 0932   MCHC 31.8 09/14/2016 1140   RDW 14.7 03/08/2017 0932   LYMPHSABS 2.4 03/08/2017 0932   MONOABS 0.4 11/15/2015 0833   EOSABS 0.1 03/08/2017 0932   BASOSABS 0.0 03/08/2017 0932   Iron/TIBC/Ferritin/ %Sat No results found for: IRON, TIBC, FERRITIN, IRONPCTSAT Lipid Panel     Component Value Date/Time   CHOL 137 07/17/2018 0849   TRIG 62 07/17/2018 0849   HDL 56 07/17/2018 0849   CHOLHDL 3 07/16/2017 0911   VLDL 13.0 07/16/2017 0911   LDLCALC 69 07/17/2018 0849   Hepatic Function Panel     Component Value Date/Time   PROT 7.1 07/17/2018 0849   ALBUMIN 3.9 (L) 07/17/2018 0849   AST 24 07/17/2018 0849   ALT 25 07/17/2018 0849   ALKPHOS 84 07/17/2018 0849   BILITOT 0.3 07/17/2018 0849      Component Value Date/Time   TSH 1.260 03/08/2017 0932   TSH 1.07 11/15/2015 0833   Results for Janeece RiggersLEWIS, Fahad DAMON (MRN [id number]) as of 08/29/2018 08:03  Ref. Range 07/17/2018 08:49  Vitamin D, 25-Hydroxy Latest Ref Range: 30.0 - 100.0 ng/mL 38.0   I, Marianna Paymentenise Haag, am acting as Energy managertranscriptionist for [employer] Corporationracey Aguilar, PA-C I, Alois Clicheracey Aguilar, PA-C have reviewed above note and agree with its content

## 2018-09-11 ENCOUNTER — Other Ambulatory Visit: Payer: Self-pay

## 2018-09-11 ENCOUNTER — Ambulatory Visit (INDEPENDENT_AMBULATORY_CARE_PROVIDER_SITE_OTHER): Payer: Self-pay | Admitting: Physician Assistant

## 2018-09-11 ENCOUNTER — Encounter (INDEPENDENT_AMBULATORY_CARE_PROVIDER_SITE_OTHER): Payer: Self-pay | Admitting: Physician Assistant

## 2018-09-11 DIAGNOSIS — Z6841 Body Mass Index (BMI) 40.0 and over, adult: Secondary | ICD-10-CM

## 2018-09-11 DIAGNOSIS — R7303 Prediabetes: Secondary | ICD-10-CM

## 2018-09-11 DIAGNOSIS — E559 Vitamin D deficiency, unspecified: Secondary | ICD-10-CM

## 2018-09-11 MED ORDER — VITAMIN D (ERGOCALCIFEROL) 1.25 MG (50000 UNIT) PO CAPS: 50000.0000 [IU] | ORAL_CAPSULE | ORAL | 0 refills | Status: DC

## 2018-09-11 NOTE — Progress Notes (Signed)
Office: [phone]  /  Fax: [phone] TeleHealth Visit:  Louis [name] has verbally consented to this TeleHealth visit today. The patient is located at home, the provider is located at the [employer] [facility] office. The participants in this visit include the listed provider and patient. The visit was conducted today via Webex.  HPI:   Chief Complaint: OBESITY Louis [name] is here to discuss his progress with his obesity treatment plan. He is on the Category 3 plan and is following his eating plan approximately 90% of the time. He states he is walking 1 mile 2 times per week. Louis [name] reports that he has been out of work due to his back pain for the last 2 weeks. He has a possible cyst on his spine and is awaiting MRI results. He denies excessive hunger.  We were unable to weigh the patient today for this TeleHealth visit. He feels as if he has maintained his weight since his last visit. He has lost 9 lbs since starting treatment with [name].  Vitamin D deficiency Louis [name] has a diagnosis of Vitamin D deficiency. He is currently taking prescription Vit D and denies nausea, vomiting or muscle weakness.  Pre-Diabetes Louis [name] has a diagnosis of prediabetes based on his elevated Hgb A1c and was informed this puts him at greater risk of developing diabetes. He is taking metformin currently and continues to work on diet and exercise to decrease risk of diabetes. He denies nausea, vomiting, or diarrhea. No polyphagia.  ASSESSMENT AND PLAN:  Vitamin D deficiency - Plan: Vitamin D, Ergocalciferol, (DRISDOL) 1.25 MG (50000 UT) CAPS capsule  Prediabetes  Class 3 severe obesity with serious comorbidity and body mass index (BMI) of 50.0 to 59.9 in adult, unspecified obesity type ([place])  PLAN:  Vitamin D Deficiency Louis [name] was informed that low Vitamin D levels contributes to fatigue and are associated with obesity, breast, and colon cancer. He agrees to continue to take prescription Vit D @ 50,000 IU  every week #4 with 0 refills and will follow-up for routine testing of Vitamin D, at least 2-3 times per year. He was informed of the risk of over-replacement of Vitamin D and agrees to not increase his dose unless he discusses this with [name] first. Louis [name] agrees to follow-up with our clinic in 2 weeks.  Pre-Diabetes Louis [name] will continue to work on weight loss, exercise, and decreasing simple carbohydrates in his diet to help decrease the risk of diabetes. We dicussed metformin including benefits and risks. He was informed that eating too many simple carbohydrates or too many calories at one sitting increases the likelihood of GI side effects. Louis [name] will continue medications and weight loss. He will follow-up with [name] as directed to monitor his progress.  Obesity Louis [name] is currently in the action stage of change. As such, his goal is to continue with weight loss efforts. He has agreed to follow the Category 3 plan. Louis [name] has been instructed to work up to a goal of 150 minutes of combined cardio and strengthening exercise per week for weight loss and overall health benefits. We discussed the following Behavioral Modification Strategies today: work on meal planning and easy cooking plans, and keeping healthy foods in the home.  Louis [name] has agreed to follow-up with our clinic in 2 weeks. He was informed of the importance of frequent follow-up visits to maximize his success with intensive lifestyle modifications for his multiple health conditions.  ALLERGIES: Allergies  Allergen Reactions   No Known Allergies  MEDICATIONS: Current Outpatient Medications on File Prior to Visit  Medication Sig Dispense Refill   acetaminophen (TYLENOL) 500 MG tablet Take 1,000 mg by mouth every 6 (six) hours as needed (for pain.).     cyclobenzaprine (FLEXERIL) 10 MG tablet Take 1 tablet (10 mg total) by mouth at bedtime. 30 tablet 0   diclofenac (VOLTAREN) 75 MG EC tablet TAKE 1 TABLET BY MOUTH TWICE A DAY 60  tablet 0   HYDROcodone-acetaminophen (NORCO) 5-325 MG tablet Take 1 tablet by mouth every 6 (six) hours as needed for moderate pain. 30 tablet 0   losartan (COZAAR) 100 MG tablet TAKE 1 TABLET BY MOUTH EVERY DAY 90 tablet 1   meloxicam (MOBIC) 7.5 MG tablet TAKE 1 TABLET BY MOUTH EVERY DAY 30 tablet 0   metFORMIN (GLUCOPHAGE) 500 MG tablet TAKE 1 TABLET BY MOUTH EVERY DAY WITH BREAKFAST 30 tablet 0   Semaglutide (RYBELSUS) 14 MG TABS Take 14 mg by mouth daily. 30 tablet 0   No current facility-administered medications on file prior to visit.     PAST MEDICAL HISTORY: Past Medical History:  Diagnosis Date   Allergy    Arthritis    in both knees   GERD (gastroesophageal reflux disease)    Headache    HTN (hypertension)    Obesity     PAST SURGICAL HISTORY: Past Surgical History:  Procedure Laterality Date   ACHILLES TENDON REPAIR     ANTERIOR CRUCIATE LIGAMENT REPAIR     LUMBAR LAMINECTOMY/DECOMPRESSION MICRODISCECTOMY Right 09/19/2016   Procedure: Right Lumbar Five-Sacral one Laminotomy/Foraminotomy/removal of Synovial cyst;  Surgeon: Barnett AbuElsner, Henry, MD;  [facility];  Service: Neurosurgery;  Laterality: Right;  Right L5-S1 Laminotomy/Foraminotomy/removal of Synovial cyst   POSTERIOR CERVICAL LAMINECTOMY N/A 06/12/2016   Procedure: Cervical One Posterior cervical laminectomy;  Surgeon: Barnett AbuElsner, Henry, MD;  [facility];  Service: Neurosurgery;  Laterality: N/A;  posterior   TONSILLECTOMY      SOCIAL HISTORY: Social History   Tobacco Use   Smoking status: Former Smoker    Packs/day: 0.25    Years: 3.00    Pack years: 0.75    Types: Cigarettes    Quit date: 06/14/2016    Years since quitting: 2.2   Smokeless tobacco: Never Used  Substance Use Topics   Alcohol use: Yes    Alcohol/week: 3.0 standard drinks    Types: 3 Cans of beer per week    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   Drug use: No    FAMILY HISTORY: Family History  Problem  Relation Age of Onset   Diabetes Father    Obesity Father    ROS: Review of Systems  Gastrointestinal: Negative for diarrhea, nausea and vomiting.  Musculoskeletal:       Negative for muscle weakness.  Endo/Heme/Allergies:       Negative for polyphagia.   PHYSICAL EXAM: Pt in no acute distress  RECENT LABS AND TESTS: BMET    Component Value Date/Time   NA 141 07/17/2018 0849   K 4.8 07/17/2018 0849   CL 100 07/17/2018 0849   CO2 27 07/17/2018 0849   GLUCOSE 93 07/17/2018 0849   GLUCOSE 98 07/16/2017 0911   BUN 15 07/17/2018 0849   CREATININE 1.15 07/17/2018 0849   CREATININE 0.94 11/15/2015 0833   CALCIUM 9.1 07/17/2018 0849   GFRNONAA 78 07/17/2018 0849   GFRNONAA >89 11/15/2015 0833   GFRAA 90 07/17/2018 0849   GFRAA >89 11/15/2015 0833   Lab  Results  Component Value Date   HGBA1C 5.6 07/17/2018   HGBA1C 5.7 (H) 10/30/2017   HGBA1C 5.9 07/16/2017   HGBA1C 5.9 (H) 03/08/2017   HGBA1C 5.8 11/16/2015   Lab Results  Component Value Date   INSULIN 11.7 07/17/2018   INSULIN 15.6 10/30/2017   INSULIN 18.0 03/08/2017   CBC    Component Value Date/Time   WBC 8.6 03/08/2017 0932   WBC 8.8 09/14/2016 1140   RBC 4.63 03/08/2017 0932   RBC 4.89 09/14/2016 1140   HGB 13.2 03/08/2017 0932   HCT 41.5 03/08/2017 0932   PLT 285 03/08/2017 0932   MCV 90 03/08/2017 0932   MCH 28.5 03/08/2017 0932   MCH 27.8 09/14/2016 1140   MCHC 31.8 03/08/2017 0932   MCHC 31.8 09/14/2016 1140   RDW 14.7 03/08/2017 0932   LYMPHSABS 2.4 03/08/2017 0932   MONOABS 0.4 11/15/2015 0833   EOSABS 0.1 03/08/2017 0932   BASOSABS 0.0 03/08/2017 0932   Iron/TIBC/Ferritin/ %Sat No results found for: IRON, TIBC, FERRITIN, IRONPCTSAT Lipid Panel     Component Value Date/Time   CHOL 137 07/17/2018 0849   TRIG 62 07/17/2018 0849   HDL 56 07/17/2018 0849   CHOLHDL 3 07/16/2017 0911   VLDL 13.0 07/16/2017 0911   LDLCALC 69 07/17/2018 0849   Hepatic Function Panel     Component Value  Date/Time   PROT 7.1 07/17/2018 0849   ALBUMIN 3.9 (L) 07/17/2018 0849   AST 24 07/17/2018 0849   ALT 25 07/17/2018 0849   ALKPHOS 84 07/17/2018 0849   BILITOT 0.3 07/17/2018 0849      Component Value Date/Time   TSH 1.260 03/08/2017 0932   TSH 1.07 11/15/2015 0833   Results for RODGERICK, ISAAK DAMON (MRN [id number]) as of 09/11/2018 16:18  Ref. Range 07/17/2018 08:49  Vitamin D, 25-Hydroxy Latest Ref Range: 30.0 - 100.0 ng/mL 38.0   I, Marianna Payment, am acting as Energy manager for [employer], PA-C I, Alois Cliche, PA-C have reviewed above note and agree with its content

## 2018-09-18 ENCOUNTER — Other Ambulatory Visit (INDEPENDENT_AMBULATORY_CARE_PROVIDER_SITE_OTHER): Payer: Self-pay | Admitting: Physician Assistant

## 2018-09-18 DIAGNOSIS — R7303 Prediabetes: Secondary | ICD-10-CM

## 2018-09-25 ENCOUNTER — Other Ambulatory Visit: Payer: Self-pay

## 2018-09-25 ENCOUNTER — Ambulatory Visit (INDEPENDENT_AMBULATORY_CARE_PROVIDER_SITE_OTHER): Payer: Self-pay | Admitting: Physician Assistant

## 2018-09-25 ENCOUNTER — Encounter (INDEPENDENT_AMBULATORY_CARE_PROVIDER_SITE_OTHER): Payer: Self-pay | Admitting: Physician Assistant

## 2018-09-25 DIAGNOSIS — E559 Vitamin D deficiency, unspecified: Secondary | ICD-10-CM

## 2018-09-25 DIAGNOSIS — Z6841 Body Mass Index (BMI) 40.0 and over, adult: Secondary | ICD-10-CM

## 2018-09-25 DIAGNOSIS — R7303 Prediabetes: Secondary | ICD-10-CM

## 2018-09-25 MED ORDER — VITAMIN D (ERGOCALCIFEROL) 1.25 MG (50000 UNIT) PO CAPS: 50000.0000 [IU] | ORAL_CAPSULE | ORAL | 0 refills | Status: DC

## 2018-09-30 NOTE — Progress Notes (Signed)
Office: [phone]  /  Fax: [phone] TeleHealth Visit:  [name] has verbally consented to this TeleHealth visit today. The patient is located at home, the provider is located at the [employer] [facility] office. The participants in this visit include the listed provider and patient and any and all parties involved. The visit was conducted today via WebEx.  HPI:   Chief Complaint: OBESITY Louis [name] is here to discuss his progress with his obesity treatment plan. He is on the Category 3 plan and the Category 4 plan and he is following his eating plan approximately 95 % of the time. He states he is walking 1 mile, 2 times per week. [name] reports that he will be out of work for the next 2 to 4 weeks. He is going to start exercising regularly. We were unable to weigh the patient today for this TeleHealth visit. He feels as if he has maintained weight since his last visit (weight not reported). He has lost 9 lbs since starting treatment with [name].  Vitamin D deficiency [name] has a diagnosis of vitamin D deficiency. [name] is currently taking vit D and he denies nausea, vomiting or muscle weakness.  Pre-Diabetes [name] has a diagnosis of prediabetes based on his elevated Hgb A1c and was informed this puts him at greater risk of developing diabetes. He is taking metformin currently and continues to work on diet and exercise to decrease risk of diabetes. He denies nausea, vomiting or diarrhea.  ASSESSMENT AND PLAN:  Vitamin D deficiency - Plan: Vitamin D, Ergocalciferol, (DRISDOL) 1.25 MG (50000 UT) CAPS capsule  Prediabetes  Class 3 severe obesity with serious comorbidity and body mass index (BMI) of 50.0 to 59.9 in adult, unspecified obesity type ([facility])  PLAN:  Vitamin D Deficiency [name] was informed that low vitamin D levels contributes to fatigue and are associated with obesity, breast, and colon cancer. [name] agrees to continue to take prescription Vit D @50 ,000 IU every week  #4 with no refills and he will follow up for routine testing of vitamin D, at least 2-3 times per year. He was informed of the risk of over-replacement of vitamin D and agrees to not increase his dose unless he discusses this with first. [name] agrees to follow up with our clinic in 4 weeks.  Pre-Diabetes [name] will continue to work on weight loss, exercise, and decreasing simple carbohydrates in his diet to help decrease the risk of diabetes. We dicussed metformin including benefits and risks. He was informed that eating too many simple carbohydrates or too many calories at one sitting increases the likelihood of GI side effects. [name] agrees to continue metformin 500 mg daily with breakfast #30 with no refills and follow up with Denny Peon as directed to monitor his progress.  Obesity [name] is currently in the action stage of change. As such, his goal is to continue with weight loss efforts He has agreed to follow the Category 4 plan [name] has been instructed to work up to a goal of 150 minutes of combined cardio and strengthening exercise per week for weight loss and overall health benefits. We discussed the following Behavioral Modification Strategies today: keeping healthy foods in the home and work on meal planning and easy cooking plans  Beauregard has agreed to follow up with our clinic in 4 weeks. He was informed of the importance of frequent follow up visits to maximize his success with intensive lifestyle modifications for his multiple health conditions.  ALLERGIES: Allergies  Allergen  Reactions   No Known Allergies     MEDICATIONS: Current Outpatient Medications on File Prior to Visit  Medication Sig Dispense Refill   acetaminophen (TYLENOL) 500 MG tablet Take 1,000 mg by mouth every 6 (six) hours as needed (for pain.).     cyclobenzaprine (FLEXERIL) 10 MG tablet Take 1 tablet (10 mg total) by mouth at bedtime. 30 tablet 0   diclofenac (VOLTAREN) 75 MG EC tablet TAKE 1 TABLET BY MOUTH  TWICE A DAY 60 tablet 0   HYDROcodone-acetaminophen (NORCO) 5-325 MG tablet Take 1 tablet by mouth every 6 (six) hours as needed for moderate pain. 30 tablet 0   losartan (COZAAR) 100 MG tablet TAKE 1 TABLET BY MOUTH EVERY DAY 90 tablet 1   meloxicam (MOBIC) 7.5 MG tablet TAKE 1 TABLET BY MOUTH EVERY DAY 30 tablet 0   metFORMIN (GLUCOPHAGE) 500 MG tablet TAKE 1 TABLET BY MOUTH EVERY DAY WITH BREAKFAST 30 tablet 0   Semaglutide (RYBELSUS) 14 MG TABS Take 14 mg by mouth daily. 30 tablet 0   No current facility-administered medications on file prior to visit.     PAST MEDICAL HISTORY: Past Medical History:  Diagnosis Date   Allergy    Arthritis    in both knees   GERD (gastroesophageal reflux disease)    Headache    HTN (hypertension)    Obesity     PAST SURGICAL HISTORY: Past Surgical History:  Procedure Laterality Date   ACHILLES TENDON REPAIR     ANTERIOR CRUCIATE LIGAMENT REPAIR     LUMBAR LAMINECTOMY/DECOMPRESSION MICRODISCECTOMY Right 09/19/2016   Procedure: Right Lumbar Five-Sacral one Laminotomy/Foraminotomy/removal of Synovial cyst;  Surgeon: Kristeen Miss, MD;  Location: [place];  Service: Neurosurgery;  Laterality: Right;  Right L5-S1 Laminotomy/Foraminotomy/removal of Synovial cyst   POSTERIOR CERVICAL LAMINECTOMY N/A 06/12/2016   Procedure: Cervical One Posterior cervical laminectomy;  Surgeon: Kristeen Miss, MD;  Location: [place];  Service: Neurosurgery;  Laterality: N/A;  posterior   TONSILLECTOMY      SOCIAL HISTORY: Social History   Tobacco Use   Smoking status: Former Smoker    Packs/day: 0.25    Years: 3.00    Pack years: 0.75    Types: Cigarettes    Quit date: 06/14/2016    Years since quitting: 2.2   Smokeless tobacco: Never Used  Substance Use Topics   Alcohol use: Yes    Alcohol/week: 3.0 standard drinks    Types: 3 Cans of beer per week    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   Drug use: No    FAMILY HISTORY: Family  History  Problem Relation Age of Onset   Diabetes Father    Obesity Father     ROS: Review of Systems  Constitutional: Negative for weight loss.  Gastrointestinal: Negative for diarrhea, nausea and vomiting.  Musculoskeletal:       Negative for muscle weakness    PHYSICAL EXAM: Pt in no acute distress  RECENT LABS AND TESTS: BMET    Component Value Date/Time   NA 141 07/17/2018 0849   K 4.8 07/17/2018 0849   CL 100 07/17/2018 0849   CO2 27 07/17/2018 0849   GLUCOSE 93 07/17/2018 0849   GLUCOSE 98 07/16/2017 0911   BUN 15 07/17/2018 0849   CREATININE 1.15 07/17/2018 0849   CREATININE 0.94 11/15/2015 0833   CALCIUM 9.1 07/17/2018 0849   GFRNONAA 78 07/17/2018 0849   GFRNONAA >89 11/15/2015 0833   GFRAA 90 07/17/2018 0849  GFRAA >89 11/15/2015 0833   Lab Results  Component Value Date   HGBA1C 5.6 07/17/2018   HGBA1C 5.7 (H) 10/30/2017   HGBA1C 5.9 07/16/2017   HGBA1C 5.9 (H) 03/08/2017   HGBA1C 5.8 11/16/2015   Lab Results  Component Value Date   INSULIN 11.7 07/17/2018   INSULIN 15.6 10/30/2017   INSULIN 18.0 03/08/2017   CBC    Component Value Date/Time   WBC 8.6 03/08/2017 0932   WBC 8.8 09/14/2016 1140   RBC 4.63 03/08/2017 0932   RBC 4.89 09/14/2016 1140   HGB 13.2 03/08/2017 0932   HCT 41.5 03/08/2017 0932   PLT 285 03/08/2017 0932   MCV 90 03/08/2017 0932   MCH 28.5 03/08/2017 0932   MCH 27.8 09/14/2016 1140   MCHC 31.8 03/08/2017 0932   MCHC 31.8 09/14/2016 1140   RDW 14.7 03/08/2017 0932   LYMPHSABS 2.4 03/08/2017 0932   MONOABS 0.4 11/15/2015 0833   EOSABS 0.1 03/08/2017 0932   BASOSABS 0.0 03/08/2017 0932   Iron/TIBC/Ferritin/ %Sat No results found for: IRON, TIBC, FERRITIN, IRONPCTSAT Lipid Panel     Component Value Date/Time   CHOL 137 07/17/2018 0849   TRIG 62 07/17/2018 0849   HDL 56 07/17/2018 0849   CHOLHDL 3 07/16/2017 0911   VLDL 13.0 07/16/2017 0911   LDLCALC 69 07/17/2018 0849   Hepatic Function Panel       Component Value Date/Time   PROT 7.1 07/17/2018 0849   ALBUMIN 3.9 (L) 07/17/2018 0849   AST 24 07/17/2018 0849   ALT 25 07/17/2018 0849   ALKPHOS 84 07/17/2018 0849   BILITOT 0.3 07/17/2018 0849      Component Value Date/Time   TSH 1.260 03/08/2017 0932   TSH 1.07 11/15/2015 0833     Ref. Range 07/17/2018 08:49  Vitamin D, 25-Hydroxy Latest Ref Range: 30.0 - 100.0 ng/mL 38.0    I, [place] CraneJoanne Murray, am acting as Energy managertranscriptionist for Alois Clicheracey Aguilar, PA-C I, Alois Clicheracey Aguilar, PA-C have reviewed above note and agree with its content

## 2018-10-02 ENCOUNTER — Other Ambulatory Visit (INDEPENDENT_AMBULATORY_CARE_PROVIDER_SITE_OTHER): Payer: Self-pay | Admitting: Physician Assistant

## 2018-10-02 DIAGNOSIS — E559 Vitamin D deficiency, unspecified: Secondary | ICD-10-CM

## 2018-10-21 ENCOUNTER — Other Ambulatory Visit: Payer: Self-pay | Admitting: Medical

## 2018-10-21 DIAGNOSIS — I10 Essential (primary) hypertension: Secondary | ICD-10-CM

## 2018-12-17 ENCOUNTER — Other Ambulatory Visit (INDEPENDENT_AMBULATORY_CARE_PROVIDER_SITE_OTHER): Payer: Self-pay | Admitting: Physician Assistant

## 2018-12-17 DIAGNOSIS — E559 Vitamin D deficiency, unspecified: Secondary | ICD-10-CM

## 2018-12-17 DIAGNOSIS — R7303 Prediabetes: Secondary | ICD-10-CM

## 2019-01-14 ENCOUNTER — Telehealth (INDEPENDENT_AMBULATORY_CARE_PROVIDER_SITE_OTHER): Payer: Self-pay | Admitting: Physician Assistant

## 2019-01-14 NOTE — Telephone Encounter (Signed)
Pt is aware we will refill meds at appt tomorrow. Jeralene Peters, LPN

## 2019-01-14 NOTE — Telephone Encounter (Signed)
Patient is requesting refills on Metformin and Vit D to be sent over to [facility] [address].  He was last seen in Sept but has a f/u scheduled for tomorrow, 1/13.  He has had difficulty getting a message sent over via My chart.  Thank you

## 2019-01-15 ENCOUNTER — Encounter (INDEPENDENT_AMBULATORY_CARE_PROVIDER_SITE_OTHER): Payer: Self-pay | Admitting: Physician Assistant

## 2019-01-15 ENCOUNTER — Ambulatory Visit (INDEPENDENT_AMBULATORY_CARE_PROVIDER_SITE_OTHER): Payer: PRIVATE HEALTH INSURANCE | Admitting: Physician Assistant

## 2019-01-15 ENCOUNTER — Other Ambulatory Visit: Payer: Self-pay

## 2019-01-15 VITALS — BP 142/87 | HR 72 | Temp 98.7°F | Ht 72.0 in | Wt 375.0 lb

## 2019-01-15 DIAGNOSIS — E559 Vitamin D deficiency, unspecified: Secondary | ICD-10-CM

## 2019-01-15 DIAGNOSIS — Z6841 Body Mass Index (BMI) 40.0 and over, adult: Secondary | ICD-10-CM

## 2019-01-15 DIAGNOSIS — Z9189 Other specified personal risk factors, not elsewhere classified: Secondary | ICD-10-CM | POA: Diagnosis not present

## 2019-01-15 DIAGNOSIS — R7303 Prediabetes: Secondary | ICD-10-CM

## 2019-01-15 MED ORDER — METFORMIN HCL 500 MG PO TABS: ORAL_TABLET | ORAL | 0 refills | Status: DC

## 2019-01-15 MED ORDER — VITAMIN D (ERGOCALCIFEROL) 1.25 MG (50000 UNIT) PO CAPS: 50000.0000 [IU] | ORAL_CAPSULE | ORAL | 0 refills | Status: DC

## 2019-01-15 NOTE — Progress Notes (Signed)
Chief Complaint:   OBESITY Louis [name] is here to discuss his progress with his obesity treatment plan along with follow-up of his obesity related diagnoses. [name] is on the Category 4 Plan and states he is following his eating plan approximately 80% of the time. [name] states he is not exercising.   Today's visit was #: 55 Starting weight:406 lbs Starting date: 03/08/2017 Today's weight: 375 lbs Today's date: 01/15/2019 Total lbs lost to date: 31 lbs Total lbs lost since last in-office visit: 32 lbs  Interim History: Louis [name] states that he recently changed jobs and is in the process of building a house. His hunger is controlled. He has tried to follow the plan during all of these life changes.   Subjective:   1. Vitamin D deficiency He's Vitamin D level was 38 on 07/17/18. He is currently taking vit D. He denies nausea, vomiting or muscle weakness. He is due for labs.   2. Prediabetes Louis [name] has a diagnosis of prediabetes based on his elevated HgA1c and was informed this puts him at greater risk of developing diabetes. His last HgA1c was 5.6. He is currently on Metformin, but he does not take every morning. He continues to work on diet and exercise to decrease his risk of diabetes. He denies nausea or hypoglycemia.  3. At risk for diabetes mellitus Louis [name] is at higher than average risk for developing diabetes due to his obesity.    Assessment/Plan:   1. Vitamin D deficiency Low Vitamin D level contributes to fatigue and are associated with obesity, breast, and colon cancer. He agrees to continue to take prescription Vitamin D @50 ,000 IU every week and will follow-up for routine testing of vitamin D, at least 2-3 times per year to avoid over-replacement. - Vitamin D, Ergocalciferol, (DRISDOL) 1.25 MG (50000 UNIT) CAPS capsule; Take 1 capsule (50,000 Units total) by mouth every 7 (seven) days.  Dispense: 4 capsule; Refill: 0  2. Prediabetes Yosmar will continue to work on weight  loss, exercise, and decreasing simple carbohydrates to help decrease the risk of diabetes.  - metFORMIN (GLUCOPHAGE) 500 MG tablet; TAKE 1 TABLET BY MOUTH EVERY DAY WITH BREAKFAST  Dispense: 30 tablet; Refill: 0  3. At risk for diabetes mellitus Isom was given approximately 15 minutes of diabetes education and counseling today. We discussed intensive lifestyle modifications today with an emphasis on weight loss as well as increasing exercise and decreasing simple carbohydrates in his diet. We also reviewed medication options with an emphasis on risk versus benefit of those discussed.   4. Class 3 severe obesity with serious comorbidity and body mass index (BMI) of 50.0 to 59.9 in adult, unspecified obesity type (HCC) Louis [name] is currently in the action stage of change. As such, his goal is to continue with weight loss efforts. He has agreed to on the Category 3 Plan modified.   We discussed the following exercise goals today: For substantial health benefits, adults should do at least 150 minutes (2 hours and 30 minutes) a week of moderate-intensity, or 75 minutes (1 hour and 15 minutes) a week of vigorous-intensity aerobic physical activity, or an equivalent combination of moderate- and vigorous-intensity aerobic activity. Aerobic activity should be performed in episodes of at least 10 minutes, and preferably, it should be spread throughout the week.  We discussed the following behavioral modification strategies today: meal planning and cooking strategies and planning for success.  [name] has agreed to follow-up with our clinic in 3 weeks. He  was informed of the importance of frequent follow-up visits to maximize his success with intensive lifestyle modifications for his multiple health conditions.   Objective:   Blood pressure (!) 142/87, pulse 72, temperature 98.7 F (37.1 C), temperature source Oral, height 6' (1.829 m), weight (!) 375 lb (170.1 kg), SpO2 95 %. Body mass index is 50.86  kg/m.  General: Cooperative, alert, well developed, in no acute distress. HEENT: Conjunctivae and lids unremarkable. Neck: No thyromegaly.  Cardiovascular: Regular rhythm.  Lungs: Normal work of breathing. Extremities: No edema.  Neurologic: No focal deficits.   Lab Results  Component Value Date   CREATININE 1.15 07/17/2018   BUN 15 07/17/2018   NA 141 07/17/2018   K 4.8 07/17/2018   CL 100 07/17/2018   CO2 27 07/17/2018   Lab Results  Component Value Date   ALT 25 07/17/2018   AST 24 07/17/2018   ALKPHOS 84 07/17/2018   BILITOT 0.3 07/17/2018   Lab Results  Component Value Date   HGBA1C 5.6 07/17/2018   HGBA1C 5.7 (H) 10/30/2017   HGBA1C 5.9 07/16/2017   HGBA1C 5.9 (H) 03/08/2017   HGBA1C 5.8 11/16/2015   Lab Results  Component Value Date   INSULIN 11.7 07/17/2018   INSULIN 15.6 10/30/2017   INSULIN 18.0 03/08/2017   Lab Results  Component Value Date   TSH 1.260 03/08/2017   Lab Results  Component Value Date   CHOL 137 07/17/2018   HDL 56 07/17/2018   LDLCALC 69 07/17/2018   TRIG 62 07/17/2018   CHOLHDL 3 07/16/2017   Lab Results  Component Value Date   WBC 8.6 03/08/2017   HGB 13.2 03/08/2017   HCT 41.5 03/08/2017   MCV 90 03/08/2017   PLT 285 03/08/2017      Attestation Statements:   Reviewed by clinician on day of visit: allergies, medications, problem list, medical history, surgical history, family history, social history, and previous encounter notes.  I, Jeralene Peters, am acting as Energy manager for [employer], PA-C.  I have reviewed the above documentation for accuracy and completeness, and I agree with the above. Alois Cliche, PA-C

## 2019-02-04 ENCOUNTER — Other Ambulatory Visit (INDEPENDENT_AMBULATORY_CARE_PROVIDER_SITE_OTHER): Payer: Self-pay | Admitting: Physician Assistant

## 2019-02-04 DIAGNOSIS — E559 Vitamin D deficiency, unspecified: Secondary | ICD-10-CM

## 2019-02-05 ENCOUNTER — Other Ambulatory Visit: Payer: Self-pay

## 2019-02-05 ENCOUNTER — Encounter (INDEPENDENT_AMBULATORY_CARE_PROVIDER_SITE_OTHER): Payer: Self-pay | Admitting: Physician Assistant

## 2019-02-05 ENCOUNTER — Ambulatory Visit (INDEPENDENT_AMBULATORY_CARE_PROVIDER_SITE_OTHER): Payer: PRIVATE HEALTH INSURANCE | Admitting: Physician Assistant

## 2019-02-05 VITALS — BP 131/79 | HR 73 | Temp 98.2°F | Ht 72.0 in | Wt 363.0 lb

## 2019-02-05 DIAGNOSIS — R7303 Prediabetes: Secondary | ICD-10-CM | POA: Diagnosis not present

## 2019-02-05 DIAGNOSIS — Z9189 Other specified personal risk factors, not elsewhere classified: Secondary | ICD-10-CM | POA: Diagnosis not present

## 2019-02-05 DIAGNOSIS — E559 Vitamin D deficiency, unspecified: Secondary | ICD-10-CM | POA: Diagnosis not present

## 2019-02-05 DIAGNOSIS — Z6841 Body Mass Index (BMI) 40.0 and over, adult: Secondary | ICD-10-CM

## 2019-02-05 MED ORDER — VITAMIN D (ERGOCALCIFEROL) 1.25 MG (50000 UNIT) PO CAPS: 50000.0000 [IU] | ORAL_CAPSULE | ORAL | 0 refills | Status: DC

## 2019-02-05 MED ORDER — METFORMIN HCL 500 MG PO TABS: ORAL_TABLET | ORAL | 0 refills | Status: DC

## 2019-02-05 NOTE — Progress Notes (Signed)
Chief Complaint:   OBESITY Louis [name] is here to discuss his progress with his obesity treatment plan along with follow-up of his obesity related diagnoses. Louis [name] is on the Category 3 Plan and states he is following his eating plan approximately 95% of the time. Louis [name] states he is walking at work 80 minutes 5 times per week.  Today's visit was #: 28 Starting weight: 406 lbs Starting date: 03/08/2017 Today's weight: 363 lbs Today's date: 02/05/2019 Total lbs lost to date: 43 Total lbs lost since last in-office visit: 12  Interim History: Boy did very well the last few weeks. He is following the plan closely and walking a lot at work. He reports hunger is controlled.  Subjective:   Vitamin D deficiency. Last Vitamin D level 38.0 on 07/17/2018. Louis [name] is on weekly prescription Vitamin D. No nausea, vomiting, or muscle weakness. He is due for labs.  Prediabetes. Louis [name] has a diagnosis of prediabetes based on his elevated HgA1c and was informed this puts him at greater risk of developing diabetes. He continues to work on diet and exercise to decrease his risk of diabetes. He denies nausea or hypoglycemia. Louis [name] is on metformin and Rybelsus once daily. No nausea, vomiting, or diarrhea. He is due for labs.  Lab Results  Component Value Date   HGBA1C 5.6 07/17/2018   Lab Results  Component Value Date   INSULIN 11.7 07/17/2018   INSULIN 15.6 10/30/2017   INSULIN 18.0 03/08/2017   At risk for diabetes mellitus. Louis [name] is at higher than average risk for developing diabetes due to his obesity.   Assessment/Plan:   Vitamin D deficiency.  Low Vitamin D level contributes to fatigue and are associated with obesity, breast, and colon cancer. He was given a refill on his Vitamin D, Ergocalciferol, (DRISDOL) 1.25 MG (50000 UNIT) CAPS capsule every week #12 with 0 refills and routine testing of VITAMIN D 25 Hydroxy (Vit-D Deficiency, Fractures) was ordered today.  Prediabetes. [place] will  continue to work on weight loss, exercise, and decreasing simple carbohydrates to help decrease the risk of diabetes. He was given a refill on his metFORMIN (GLUCOPHAGE) 500 MG tablet #90 with 0 refills. Comprehensive metabolic panel, Hemoglobin A1c, Insulin, random labs ordered today.  At risk for diabetes mellitus. Louis [name] was given approximately 15 minutes of diabetes education and counseling today. We discussed intensive lifestyle modifications today with an emphasis on weight loss as well as increasing exercise and decreasing simple carbohydrates in his diet. We also reviewed medication options with an emphasis on risk versus benefit of those discussed.   Repetitive spaced learning was employed today to elicit superior memory formation and behavioral change.  Class 3 severe obesity with serious comorbidity and body mass index (BMI) of 45.0 to 49.9 in adult, unspecified obesity type (Louis [name]).  Louis [name] is currently in the action stage of change. As such, his goal is to continue with weight loss efforts. He has agreed to the Category 3 Plan.   Exercise goals: For substantial health benefits, adults should do at least 150 minutes (2 hours and 30 minutes) a week of moderate-intensity, or 75 minutes (1 hour and 15 minutes) a week of vigorous-intensity aerobic physical activity, or an equivalent combination of moderate- and vigorous-intensity aerobic activity. Aerobic activity should be performed in episodes of at least 10 minutes, and preferably, it should be spread throughout the week.  Behavioral modification strategies: meal planning and cooking strategies and planning for success.  Louis [name] has agreed to follow-up  with our clinic in 3 weeks. He was informed of the importance of frequent follow-up visits to maximize his success with intensive lifestyle modifications for his multiple health conditions.   Louis [name] was informed we would discuss his lab results at his next visit unless there is a critical issue  that needs to be addressed sooner. Louis [name] agreed to keep his next visit at the agreed upon time to discuss these results.  Objective:   Blood pressure 131/79, pulse 73, temperature 98.2 F (36.8 C), temperature source Oral, height 6' (1.829 m), weight (!) 363 lb (164.7 kg), SpO2 96 %. Body mass index is 49.23 kg/m.  General: Cooperative, alert, well developed, in no acute distress. HEENT: Conjunctivae and lids unremarkable. Cardiovascular: Regular rhythm.  Lungs: Normal work of breathing. Neurologic: No focal deficits.   Lab Results  Component Value Date   CREATININE 1.15 07/17/2018   BUN 15 07/17/2018   NA 141 07/17/2018   K 4.8 07/17/2018   CL 100 07/17/2018   CO2 27 07/17/2018   Lab Results  Component Value Date   ALT 25 07/17/2018   AST 24 07/17/2018   ALKPHOS 84 07/17/2018   BILITOT 0.3 07/17/2018   Lab Results  Component Value Date   HGBA1C 5.6 07/17/2018   HGBA1C 5.7 (H) 10/30/2017   HGBA1C 5.9 07/16/2017   HGBA1C 5.9 (H) 03/08/2017   HGBA1C 5.8 11/16/2015   Lab Results  Component Value Date   INSULIN 11.7 07/17/2018   INSULIN 15.6 10/30/2017   INSULIN 18.0 03/08/2017   Lab Results  Component Value Date   TSH 1.260 03/08/2017   Lab Results  Component Value Date   CHOL 137 07/17/2018   HDL 56 07/17/2018   LDLCALC 69 07/17/2018   TRIG 62 07/17/2018   CHOLHDL 3 07/16/2017   Lab Results  Component Value Date   WBC 8.6 03/08/2017   HGB 13.2 03/08/2017   HCT 41.5 03/08/2017   MCV 90 03/08/2017   PLT 285 03/08/2017   No results found for: IRON, TIBC, FERRITIN  Attestation Statements:   Reviewed by clinician on day of visit: allergies, medications, problem list, medical history, surgical history, family history, social history, and previous encounter notes.  IMarianna Payment, am acting as transcriptionist for Alois Cliche, PA-C   I have reviewed the above documentation for accuracy and completeness, and I agree with the above. Alois Cliche,  PA-C

## 2019-02-06 ENCOUNTER — Other Ambulatory Visit (INDEPENDENT_AMBULATORY_CARE_PROVIDER_SITE_OTHER): Payer: Self-pay | Admitting: Physician Assistant

## 2019-02-06 DIAGNOSIS — R7303 Prediabetes: Secondary | ICD-10-CM

## 2019-02-06 LAB — COMPREHENSIVE METABOLIC PANEL
ALT: 11 IU/L (ref 0–44)
AST: 15 IU/L (ref 0–40)
Albumin/Globulin Ratio: 1.4 (ref 1.2–2.2)
Albumin: 4.3 g/dL (ref 4.0–5.0)
Alkaline Phosphatase: 85 IU/L (ref 39–117)
BUN/Creatinine Ratio: 7 — ABNORMAL LOW (ref 9–20)
BUN: 8 mg/dL (ref 6–24)
Bilirubin Total: 0.4 mg/dL (ref 0.0–1.2)
CO2: 26 mmol/L (ref 20–29)
Calcium: 9.5 mg/dL (ref 8.7–10.2)
Chloride: 101 mmol/L (ref 96–106)
Creatinine, Ser: 1.1 mg/dL (ref 0.76–1.27)
GFR calc Af Amer: 94 mL/min/{1.73_m2} (ref >59)
GFR calc non Af Amer: 81 mL/min/{1.73_m2} (ref >59)
Globulin, Total: 3 g/dL (ref 1.5–4.5)
Glucose: 82 mg/dL (ref 65–99)
Potassium: 4.2 mmol/L (ref 3.5–5.2)
Sodium: 140 mmol/L (ref 134–144)
Total Protein: 7.3 g/dL (ref 6.0–8.5)

## 2019-02-06 LAB — HEMOGLOBIN A1C
Est. average glucose Bld gHb Est-mCnc: 111 mg/dL
Hgb A1c MFr Bld: 5.5 % (ref 4.8–5.6)

## 2019-02-06 LAB — VITAMIN D 25 HYDROXY (VIT D DEFICIENCY, FRACTURES): Vit D, 25-Hydroxy: 45.1 ng/mL (ref 30.0–100.0)

## 2019-02-06 LAB — INSULIN, RANDOM: INSULIN: 6.5 u[IU]/mL (ref 2.6–24.9)

## 2019-02-26 ENCOUNTER — Other Ambulatory Visit: Payer: Self-pay

## 2019-02-26 ENCOUNTER — Ambulatory Visit (INDEPENDENT_AMBULATORY_CARE_PROVIDER_SITE_OTHER): Payer: PRIVATE HEALTH INSURANCE | Admitting: Physician Assistant

## 2019-02-26 ENCOUNTER — Encounter (INDEPENDENT_AMBULATORY_CARE_PROVIDER_SITE_OTHER): Payer: Self-pay | Admitting: Physician Assistant

## 2019-02-26 VITALS — BP 145/77 | HR 71 | Temp 98.3°F | Ht 72.0 in | Wt 357.0 lb

## 2019-02-26 DIAGNOSIS — R7303 Prediabetes: Secondary | ICD-10-CM

## 2019-02-26 DIAGNOSIS — Z6841 Body Mass Index (BMI) 40.0 and over, adult: Secondary | ICD-10-CM

## 2019-02-26 DIAGNOSIS — G4733 Obstructive sleep apnea (adult) (pediatric): Secondary | ICD-10-CM

## 2019-02-26 NOTE — Progress Notes (Signed)
Chief Complaint:   OBESITY Louis [name] is here to discuss his progress with his obesity treatment plan along with follow-up of his obesity related diagnoses. Louis [name] is on the Category 3 Plan and states he is following his eating plan approximately 80% of the time. Louis [name] states he is walking 30 minutes 5 times per week.  Today's visit was #: 33 Starting weight: 406 lbs Starting date: 03/08/2017 Today's weight: 357 lbs Today's date: 02/26/2019 Total lbs lost to date: 49  Total lbs lost since last in-office visit: 6  Interim History: Louis [name] reports that he feels like he "fell off" the plan a little the past few weeks. He states that he hasn't been as hungry and has been eating less.  Subjective:   Prediabetes. Louis [name] has a diagnosis of prediabetes based on his elevated HgA1c and was informed this puts him at greater risk of developing diabetes. He continues to work on diet and exercise to decrease his risk of diabetes. He denies nausea, vomiting, or diarrhea. Louis [name] is on metformin and Rybelsus. No polyphagia.  Lab Results  Component Value Date   HGBA1C 5.5 02/05/2019   Lab Results  Component Value Date   INSULIN 6.5 02/05/2019   INSULIN 11.7 07/17/2018   INSULIN 15.6 10/30/2017   INSULIN 18.0 03/08/2017   OSA (obstructive sleep apnea). Trust wears a CPAP and reports that the last 3 nights he has taken it off in the middle of the night.  Assessment/Plan:   Prediabetes. Louis [name] will continue to work on weight loss, exercise, and decreasing simple carbohydrates to help decrease the risk of diabetes. He will continue his medications as directed.  OSA (obstructive sleep apnea). Intensive lifestyle modifications are the first line treatment for this issue. We discussed several lifestyle modifications today and he will continue to work on diet, exercise and weight loss efforts. We will continue to monitor. Orders and follow up as documented in patient record. Louis [name] will wear CPAP  consistently.  Counseling  Sleep apnea is a condition in which breathing pauses or becomes shallow during sleep. This happens over and over during the night. This disrupts your sleep and keeps your body from getting the rest that it needs, which can cause tiredness and lack of energy (fatigue) during the day.  Sleep apnea treatment: If you were given a device to open your airway while you sleep, USE IT!  Sleep hygiene:   Limit or avoid alcohol, caffeinated beverages, and cigarettes, especially close to bedtime.   Do not eat a large meal or eat spicy foods right before bedtime. This can lead to digestive discomfort that can make it hard for you to sleep.  Keep a sleep diary to help you and your health care provider figure out what could be causing your insomnia.  . Make your bedroom a dark, comfortable place where it is easy to fall asleep. ? Put up shades or blackout curtains to block light from outside. ? Use a white noise machine to block noise. ? Keep the temperature cool. . Limit screen use before bedtime. This includes: ? Watching TV. ? Using your smartphone, tablet, or computer. . Stick to a routine that includes going to bed and waking up at the same times every day and night. This can help you fall asleep faster. Consider making a quiet activity, such as reading, part of your nighttime routine. . Try to avoid taking naps during the day so that you sleep better at night. . Get out of  bed if you are still awake after 15 minutes of trying to sleep. Keep the lights down, but try reading or doing a quiet activity. When you feel sleepy, go back to bed.  Class 3 severe obesity with serious comorbidity and body mass index (BMI) of 45.0 to 49.9 in adult, unspecified obesity type ([facility])  Louis [name] is currently in the action stage of change. As such, his goal is to continue with weight loss efforts. He has agreed to the Category 3 Plan.   Exercise goals: For substantial health benefits, adults  should do at least 150 minutes (2 hours and 30 minutes) a week of moderate-intensity, or 75 minutes (1 hour and 15 minutes) a week of vigorous-intensity aerobic physical activity, or an equivalent combination of moderate- and vigorous-intensity aerobic activity. Aerobic activity should be performed in episodes of at least 10 minutes, and preferably, it should be spread throughout the week.  Behavioral modification strategies: meal planning and cooking strategies.  [name] has agreed to follow-up with our clinic in 3 weeks. He was informed of the importance of frequent follow-up visits to maximize his success with intensive lifestyle modifications for his multiple health conditions.   Objective:   Blood pressure (!) 145/77, pulse 71, temperature 98.3 F (36.8 C), temperature source Oral, height 6' (1.829 m), weight (!) 357 lb (161.9 kg), SpO2 96 %. Body mass index is 48.42 kg/m.  General: Cooperative, alert, well developed, in no acute distress. HEENT: Conjunctivae and lids unremarkable. Cardiovascular: Regular rhythm.  Lungs: Normal work of breathing. Neurologic: No focal deficits.   Lab Results  Component Value Date   CREATININE 1.10 02/05/2019   BUN 8 02/05/2019   NA 140 02/05/2019   K 4.2 02/05/2019   CL 101 02/05/2019   CO2 26 02/05/2019   Lab Results  Component Value Date   ALT 11 02/05/2019   AST 15 02/05/2019   ALKPHOS 85 02/05/2019   BILITOT 0.4 02/05/2019   Lab Results  Component Value Date   HGBA1C 5.5 02/05/2019   HGBA1C 5.6 07/17/2018   HGBA1C 5.7 (H) 10/30/2017   HGBA1C 5.9 07/16/2017   HGBA1C 5.9 (H) 03/08/2017   Lab Results  Component Value Date   INSULIN 6.5 02/05/2019   INSULIN 11.7 07/17/2018   INSULIN 15.6 10/30/2017   INSULIN 18.0 03/08/2017   Lab Results  Component Value Date   TSH 1.260 03/08/2017   Lab Results  Component Value Date   CHOL 137 07/17/2018   HDL 56 07/17/2018   LDLCALC 69 07/17/2018   TRIG 62 07/17/2018   CHOLHDL 3  07/16/2017   Lab Results  Component Value Date   WBC 8.6 03/08/2017   HGB 13.2 03/08/2017   HCT 41.5 03/08/2017   MCV 90 03/08/2017   PLT 285 03/08/2017   No results found for: IRON, TIBC, FERRITIN  Attestation Statements:   Reviewed by clinician on day of visit: allergies, medications, problem list, medical history, surgical history, family history, social history, and previous encounter notes.  Time spent on visit including pre-visit chart review and post-visit care was 30 minutes.   IMichaelene Song, am acting as transcriptionist for Abby Potash, PA-C   I have reviewed the above documentation for accuracy and completeness, and I agree with the above. Abby Potash, PA-C

## 2019-03-04 ENCOUNTER — Other Ambulatory Visit (INDEPENDENT_AMBULATORY_CARE_PROVIDER_SITE_OTHER): Payer: Self-pay | Admitting: Physician Assistant

## 2019-03-04 DIAGNOSIS — R7303 Prediabetes: Secondary | ICD-10-CM

## 2019-03-19 ENCOUNTER — Ambulatory Visit (INDEPENDENT_AMBULATORY_CARE_PROVIDER_SITE_OTHER): Payer: PRIVATE HEALTH INSURANCE | Admitting: Physician Assistant

## 2019-04-01 ENCOUNTER — Other Ambulatory Visit (INDEPENDENT_AMBULATORY_CARE_PROVIDER_SITE_OTHER): Payer: Self-pay | Admitting: Physician Assistant

## 2019-04-01 DIAGNOSIS — R7303 Prediabetes: Secondary | ICD-10-CM

## 2019-04-02 ENCOUNTER — Encounter (INDEPENDENT_AMBULATORY_CARE_PROVIDER_SITE_OTHER): Payer: Self-pay | Admitting: Physician Assistant

## 2019-04-02 ENCOUNTER — Ambulatory Visit (INDEPENDENT_AMBULATORY_CARE_PROVIDER_SITE_OTHER): Payer: PRIVATE HEALTH INSURANCE | Admitting: Physician Assistant

## 2019-04-02 ENCOUNTER — Other Ambulatory Visit: Payer: Self-pay

## 2019-04-02 VITALS — BP 122/72 | HR 77 | Temp 98.6°F | Ht 72.0 in | Wt 349.0 lb

## 2019-04-02 DIAGNOSIS — E559 Vitamin D deficiency, unspecified: Secondary | ICD-10-CM

## 2019-04-02 DIAGNOSIS — Z9189 Other specified personal risk factors, not elsewhere classified: Secondary | ICD-10-CM | POA: Diagnosis not present

## 2019-04-02 DIAGNOSIS — R7303 Prediabetes: Secondary | ICD-10-CM | POA: Diagnosis not present

## 2019-04-02 DIAGNOSIS — Z6841 Body Mass Index (BMI) 40.0 and over, adult: Secondary | ICD-10-CM

## 2019-04-02 MED ORDER — METFORMIN HCL 500 MG PO TABS: ORAL_TABLET | ORAL | 0 refills | Status: DC

## 2019-04-02 MED ORDER — RYBELSUS 14 MG PO TABS: 14.0000 mg | ORAL_TABLET | Freq: Every day | ORAL | 0 refills | Status: DC

## 2019-04-03 ENCOUNTER — Encounter (INDEPENDENT_AMBULATORY_CARE_PROVIDER_SITE_OTHER): Payer: Self-pay

## 2019-04-03 NOTE — Progress Notes (Signed)
Chief Complaint:   OBESITY Louis [name] is here to discuss his progress with his obesity treatment plan along with follow-up of his obesity related diagnoses. Louis [name] is on the Category 3 Plan and states he is following his eating plan approximately 80% of the time. Louis [name] states he is walking 1 mile 7 times per week.  Today's visit was #: 54 Starting weight: 406 lbs Starting date: 03/08/2017 Today's weight: 349 lbs Today's date: 04/02/2019 Total lbs lost to date: 66 Total lbs lost since last in-office visit: 8  Interim History: Louis [name] did very well with weight loss. He is stressed with personal and work issues.  Subjective:   Prediabetes. Louis [name] has a diagnosis of prediabetes based on his elevated HgA1c and was informed this puts him at greater risk of developing diabetes. He continues to work on diet and exercise to decrease his risk of diabetes. He denies nausea, vomiting, or diarrhea. No polyphagia. Louis [name] is on metformin and Rybelsus.   Lab Results  Component Value Date   HGBA1C 5.5 02/05/2019   Lab Results  Component Value Date   INSULIN 6.5 02/05/2019   INSULIN 11.7 07/17/2018   INSULIN 15.6 10/30/2017   INSULIN 18.0 03/08/2017   Vitamin D deficiency. Louis [name] is on Vitamin D weekly. No nausea, vomiting, or muscle weakness. Last Vitamin D 45.1 on 02/05/2019.  At risk for diabetes mellitus. Louis [name] is at higher than average risk for developing diabetes due to his obesity.   Assessment/Plan:   Prediabetes. Louis [name] will continue to work on weight loss, exercise, and decreasing simple carbohydrates to help decrease the risk of diabetes. He was given refills on his metFORMIN (GLUCOPHAGE) 500 MG tablet #30 with 0 refills and Semaglutide (RYBELSUS) 14 MG TABS #30 with 0 refills.  Vitamin D deficiency. Low Vitamin D level contributes to fatigue and are associated with obesity, breast, and colon cancer. He agrees to continue to take Vitamin D @50 ,000 IU every week and will  follow-up for routine testing of Vitamin D, at least 2-3 times per year to avoid over-replacement.  At risk for diabetes mellitus. Louis [name] was given approximately 15 minutes of diabetes education and counseling today. We discussed intensive lifestyle modifications today with an emphasis on weight loss as well as increasing exercise and decreasing simple carbohydrates in his diet. We also reviewed medication options with an emphasis on risk versus benefit of those discussed.   Repetitive spaced learning was employed today to elicit superior memory formation and behavioral change.  Class 3 severe obesity with serious comorbidity and body mass index (BMI) of 45.0 to 49.9 in adult, unspecified obesity type ([place]).  Louis [name] is currently in the action stage of change. As such, his goal is to continue with weight loss efforts. He has agreed to the Category 3 Plan.   Exercise goals: For substantial health benefits, adults should do at least 150 minutes (2 hours and 30 minutes) a week of moderate-intensity, or 75 minutes (1 hour and 15 minutes) a week of vigorous-intensity aerobic physical activity, or an equivalent combination of moderate- and vigorous-intensity aerobic activity. Aerobic activity should be performed in episodes of at least 10 minutes, and preferably, it should be spread throughout the week.  Behavioral modification strategies: meal planning and cooking strategies and keeping healthy foods in the home.  Louis [name] has agreed to follow-up with our clinic in 3 weeks. He was informed of the importance of frequent follow-up visits to maximize his success with intensive lifestyle modifications for his multiple health  conditions.   Objective:   Blood pressure 122/72, pulse 77, temperature 98.6 F (37 C), temperature source Oral, height 6' (1.829 m), weight (!) 349 lb (158.3 kg), SpO2 96 %. Body mass index is 47.33 kg/m.  General: Cooperative, alert, well developed, in no acute distress. HEENT:  Conjunctivae and lids unremarkable. Cardiovascular: Regular rhythm.  Lungs: Normal work of breathing. Neurologic: No focal deficits.   Lab Results  Component Value Date   CREATININE 1.10 02/05/2019   BUN 8 02/05/2019   NA 140 02/05/2019   K 4.2 02/05/2019   CL 101 02/05/2019   CO2 26 02/05/2019   Lab Results  Component Value Date   ALT 11 02/05/2019   AST 15 02/05/2019   ALKPHOS 85 02/05/2019   BILITOT 0.4 02/05/2019   Lab Results  Component Value Date   HGBA1C 5.5 02/05/2019   HGBA1C 5.6 07/17/2018   HGBA1C 5.7 (H) 10/30/2017   HGBA1C 5.9 07/16/2017   HGBA1C 5.9 (H) 03/08/2017   Lab Results  Component Value Date   INSULIN 6.5 02/05/2019   INSULIN 11.7 07/17/2018   INSULIN 15.6 10/30/2017   INSULIN 18.0 03/08/2017   Lab Results  Component Value Date   TSH 1.260 03/08/2017   Lab Results  Component Value Date   CHOL 137 07/17/2018   HDL 56 07/17/2018   LDLCALC 69 07/17/2018   TRIG 62 07/17/2018   CHOLHDL 3 07/16/2017   Lab Results  Component Value Date   WBC 8.6 03/08/2017   HGB 13.2 03/08/2017   HCT 41.5 03/08/2017   MCV 90 03/08/2017   PLT 285 03/08/2017   No results found for: IRON, TIBC, FERRITIN  Attestation Statements:   Reviewed by clinician on day of visit: allergies, medications, problem list, medical history, surgical history, family history, social history, and previous encounter notes.  IMarianna Payment, am acting as transcriptionist for Alois Cliche, PA-C   I have reviewed the above documentation for accuracy and completeness, and I agree with the above. Alois Cliche, PA-C

## 2019-04-12 ENCOUNTER — Other Ambulatory Visit (INDEPENDENT_AMBULATORY_CARE_PROVIDER_SITE_OTHER): Payer: Self-pay | Admitting: Physician Assistant

## 2019-04-12 DIAGNOSIS — R7303 Prediabetes: Secondary | ICD-10-CM

## 2019-04-23 ENCOUNTER — Encounter (INDEPENDENT_AMBULATORY_CARE_PROVIDER_SITE_OTHER): Payer: Self-pay | Admitting: Physician Assistant

## 2019-04-23 ENCOUNTER — Ambulatory Visit (INDEPENDENT_AMBULATORY_CARE_PROVIDER_SITE_OTHER): Payer: PRIVATE HEALTH INSURANCE | Admitting: Physician Assistant

## 2019-04-23 ENCOUNTER — Other Ambulatory Visit: Payer: Self-pay

## 2019-04-23 VITALS — BP 137/85 | HR 75 | Temp 98.7°F | Ht 72.0 in | Wt 344.0 lb

## 2019-04-23 DIAGNOSIS — R7303 Prediabetes: Secondary | ICD-10-CM | POA: Diagnosis not present

## 2019-04-23 DIAGNOSIS — Z6841 Body Mass Index (BMI) 40.0 and over, adult: Secondary | ICD-10-CM | POA: Diagnosis not present

## 2019-04-23 NOTE — Progress Notes (Signed)
Chief Complaint:   OBESITY Louis [name] is here to discuss his progress with his obesity treatment plan along with follow-up of his obesity related diagnoses. Louis [name] is on the Category 3 Plan and states he is following his eating plan approximately 85% of the time. Louis [name] states he is walking the dog 20-30 minutes 5 times per week.  Today's visit was #: 35 Starting weight: 406 lbs Starting date: 03/08/2017 Today's weight: 344 lbs Today's date: 04/23/2019 Total lbs lost to date: 62 Total lbs lost since last in-office visit: 5  Interim History: Louis [name] did well with the plan. He states he drank more beer than normal due to separating from his wife.  Subjective:   Prediabetes. Louis [name] has a diagnosis of prediabetes based on his elevated HgA1c and was informed this puts him at greater risk of developing diabetes. He continues to work on diet and exercise to decrease his risk of diabetes. He denies nausea, vomiting, or diarrhea. Louis [name] is on Rybelsus. He has not been taking metformin the last 1-2 weeks. No polyphagia.  Lab Results  Component Value Date   HGBA1C 5.5 02/05/2019   Lab Results  Component Value Date   INSULIN 6.5 02/05/2019   INSULIN 11.7 07/17/2018   INSULIN 15.6 10/30/2017   INSULIN 18.0 03/08/2017   Assessment/Plan:   Prediabetes. Louis [name] will continue to work on weight loss, exercise, and decreasing simple carbohydrates to help decrease the risk of diabetes. He will continue his medication as directed.  Class 3 severe obesity with serious comorbidity and body mass index (BMI) of 45.0 to 49.9 in adult, unspecified obesity type ([facility]).  Louis [name] is currently in the action stage of change. As such, his goal is to continue with weight loss efforts. He has agreed to the Category 3 Plan.   Exercise goals: For substantial health benefits, adults should do at least 150 minutes (2 hours and 30 minutes) a week of moderate-intensity, or 75 minutes (1 hour and 15 minutes) a week  of vigorous-intensity aerobic physical activity, or an equivalent combination of moderate- and vigorous-intensity aerobic activity. Aerobic activity should be performed in episodes of at least 10 minutes, and preferably, it should be spread throughout the week.  Behavioral modification strategies: decreasing liquid calories, meal planning and cooking strategies and keeping healthy foods in the home.  Louis [name] has agreed to follow-up with our clinic in 3 weeks. He was informed of the importance of frequent follow-up visits to maximize his success with intensive lifestyle modifications for his multiple health conditions.   Objective:   Blood pressure 137/85, pulse 75, temperature 98.7 F (37.1 C), temperature source Oral, height 6' (1.829 m), weight (!) 344 lb (156 kg), SpO2 97 %. Body mass index is 46.65 kg/m.  General: Cooperative, alert, well developed, in no acute distress. HEENT: Conjunctivae and lids unremarkable. Cardiovascular: Regular rhythm.  Lungs: Normal work of breathing. Neurologic: No focal deficits.   Lab Results  Component Value Date   CREATININE 1.10 02/05/2019   BUN 8 02/05/2019   NA 140 02/05/2019   K 4.2 02/05/2019   CL 101 02/05/2019   CO2 26 02/05/2019   Lab Results  Component Value Date   ALT 11 02/05/2019   AST 15 02/05/2019   ALKPHOS 85 02/05/2019   BILITOT 0.4 02/05/2019   Lab Results  Component Value Date   HGBA1C 5.5 02/05/2019   HGBA1C 5.6 07/17/2018   HGBA1C 5.7 (H) 10/30/2017   HGBA1C 5.9 07/16/2017   HGBA1C 5.9 (H) 03/08/2017  Lab Results  Component Value Date   INSULIN 6.5 02/05/2019   INSULIN 11.7 07/17/2018   INSULIN 15.6 10/30/2017   INSULIN 18.0 03/08/2017   Lab Results  Component Value Date   TSH 1.260 03/08/2017   Lab Results  Component Value Date   CHOL 137 07/17/2018   HDL 56 07/17/2018   LDLCALC 69 07/17/2018   TRIG 62 07/17/2018   CHOLHDL 3 07/16/2017   Lab Results  Component Value Date   WBC 8.6 03/08/2017   HGB  13.2 03/08/2017   HCT 41.5 03/08/2017   MCV 90 03/08/2017   PLT 285 03/08/2017   No results found for: IRON, TIBC, FERRITIN  Attestation Statements:   Reviewed by clinician on day of visit: allergies, medications, problem list, medical history, surgical history, family history, social history, and previous encounter notes.  Time spent on visit including pre-visit chart review and post-visit charting and care was 25 minutes.   Louis [name], am acting as transcriptionist for Abby Potash, PA-C   I have reviewed the above documentation for accuracy and completeness, and I agree with the above. Abby Potash, PA-C

## 2019-04-25 ENCOUNTER — Other Ambulatory Visit (INDEPENDENT_AMBULATORY_CARE_PROVIDER_SITE_OTHER): Payer: Self-pay | Admitting: Physician Assistant

## 2019-04-25 DIAGNOSIS — E559 Vitamin D deficiency, unspecified: Secondary | ICD-10-CM

## 2019-04-28 ENCOUNTER — Other Ambulatory Visit (INDEPENDENT_AMBULATORY_CARE_PROVIDER_SITE_OTHER): Payer: Self-pay | Admitting: Physician Assistant

## 2019-04-28 DIAGNOSIS — R7303 Prediabetes: Secondary | ICD-10-CM

## 2019-05-15 ENCOUNTER — Ambulatory Visit (INDEPENDENT_AMBULATORY_CARE_PROVIDER_SITE_OTHER): Payer: PRIVATE HEALTH INSURANCE | Admitting: Physician Assistant

## 2019-05-15 ENCOUNTER — Other Ambulatory Visit: Payer: Self-pay

## 2019-05-15 VITALS — BP 126/84 | HR 77 | Temp 98.6°F | Ht 72.0 in | Wt 343.0 lb

## 2019-05-15 DIAGNOSIS — R7303 Prediabetes: Secondary | ICD-10-CM | POA: Diagnosis not present

## 2019-05-15 DIAGNOSIS — Z9189 Other specified personal risk factors, not elsewhere classified: Secondary | ICD-10-CM | POA: Diagnosis not present

## 2019-05-15 DIAGNOSIS — E559 Vitamin D deficiency, unspecified: Secondary | ICD-10-CM

## 2019-05-15 DIAGNOSIS — I10 Essential (primary) hypertension: Secondary | ICD-10-CM | POA: Diagnosis not present

## 2019-05-15 DIAGNOSIS — Z6841 Body Mass Index (BMI) 40.0 and over, adult: Secondary | ICD-10-CM

## 2019-05-15 MED ORDER — LOSARTAN POTASSIUM 100 MG PO TABS: 100.0000 mg | ORAL_TABLET | Freq: Every day | ORAL | 0 refills | Status: DC

## 2019-05-15 MED ORDER — VITAMIN D (ERGOCALCIFEROL) 1.25 MG (50000 UNIT) PO CAPS: 50000.0000 [IU] | ORAL_CAPSULE | ORAL | 0 refills | Status: DC

## 2019-05-15 MED ORDER — RYBELSUS 14 MG PO TABS: 14.0000 mg | ORAL_TABLET | Freq: Every day | ORAL | 0 refills | Status: DC

## 2019-05-15 NOTE — Progress Notes (Signed)
Chief Complaint:   OBESITY Louis [name] is here to discuss his progress with his obesity treatment plan along with follow-up of his obesity related diagnoses. Louis [name] is on the Category 3 Plan and states he is following his eating plan approximately 90% of the time. Louis [name] states he is walking over a mile 4 times per week.  Today's visit was #: 47 Starting weight: 406 lbs Starting date: 03/08/2017 Today's weight: 343 lbs Today's date: 05/15/2019 Total lbs lost to date: 63 Total lbs lost since last in-office visit: 1  Interim History: Louis [name] has not been following the plan as closely the last few weeks. He is finally getting some hunger cues during the day; he usually does not feel hungry.  Subjective:   Vitamin D deficiency. Louis [name] is on prescription Vitamin D. No nausea, vomiting, or muscle weakness. He is due for labs. Last Vitamin D 45.1 on 02/05/2019.  Prediabetes. Louis [name] has a diagnosis of prediabetes based on his elevated HgA1c and was informed this puts him at greater risk of developing diabetes. He continues to work on diet and exercise to decrease his risk of diabetes. He denies nausea, vomiting, or diarrhea. No polyphagia. Louis [name] is on Rybelsus. He is due for labs.  Lab Results  Component Value Date   HGBA1C 5.5 02/05/2019   Lab Results  Component Value Date   INSULIN 6.5 02/05/2019   INSULIN 11.7 07/17/2018   INSULIN 15.6 10/30/2017   INSULIN 18.0 03/08/2017   Essential hypertension. Louis [name] is on losartan. No chest pain. No headache. Blood pressure is controlled.  BP Readings from Last 3 Encounters:  05/15/19 126/84  04/23/19 137/85  04/02/19 122/72   Lab Results  Component Value Date   CREATININE 1.10 02/05/2019   CREATININE 1.15 07/17/2018   CREATININE 0.98 10/30/2017   At risk for heart disease. Louis [name] is at a higher than average risk for cardiovascular disease due to obesity.   Assessment/Plan:   Vitamin D deficiency. Low Vitamin D level contributes  to fatigue and are associated with obesity, breast, and colon cancer. He was given a refill on his Vitamin D, Ergocalciferol, (DRISDOL) 1.25 MG (50000 UNIT) CAPS capsule every week and VITAMIN D 25 Hydroxy (Vit-D Deficiency, Fractures) level was ordered today.  Prediabetes. Louis [name] will continue to work on weight loss, exercise, and decreasing simple carbohydrates to help decrease the risk of diabetes. Refill was given for Semaglutide (RYBELSUS) 14 MG TABS #30 with 0 refills. Hemoglobin A1c, Insulin, random labs ordered today.  Essential hypertension. Louis [name] is working on healthy weight loss and exercise to improve blood pressure control. We will watch for signs of hypotension as he continues his lifestyle modifications. Refill was given for losartan (COZAAR) 100 MG tablet #30 with 0 refills. Comprehensive metabolic panel ordered today.  At risk for heart disease. Louis [name] was given approximately 15 minutes of coronary artery disease prevention counseling today. He is 44 y.o. male and has risk factors for heart disease including obesity. We discussed intensive lifestyle modifications today with an emphasis on specific weight loss instructions and strategies.   Repetitive spaced learning was employed today to elicit superior memory formation and behavioral change.  Class 3 severe obesity with serious comorbidity and body mass index (BMI) of 45.0 to 49.9 in adult, unspecified obesity type ([place]).  Louis [name] is currently in the action stage of change. As such, his goal is to continue with weight loss efforts. He has agreed to the Category 3 Plan.   Exercise goals: For substantial  health benefits, adults should do at least 150 minutes (2 hours and 30 minutes) a week of moderate-intensity, or 75 minutes (1 hour and 15 minutes) a week of vigorous-intensity aerobic physical activity, or an equivalent combination of moderate- and vigorous-intensity aerobic activity. Aerobic activity should be performed in episodes of at  least 10 minutes, and preferably, it should be spread throughout the week.  Behavioral modification strategies: meal planning and cooking strategies and planning for success.  Louis [name] has agreed to follow-up with our clinic in 3 weeks. He was informed of the importance of frequent follow-up visits to maximize his success with intensive lifestyle modifications for his multiple health conditions.   Louis [name] was informed we would discuss his lab results at his next visit unless there is a critical issue that needs to be addressed sooner. Louis [name] agreed to keep his next visit at the agreed upon time to discuss these results.  Objective:   Blood pressure 126/84, pulse 77, temperature 98.6 F (37 C), temperature source Oral, height 6' (1.829 m), weight (!) 343 lb (155.6 kg), SpO2 94 %. Body mass index is 46.52 kg/m.  General: Cooperative, alert, well developed, in no acute distress. HEENT: Conjunctivae and lids unremarkable. Cardiovascular: Regular rhythm.  Lungs: Normal work of breathing. Neurologic: No focal deficits.   Lab Results  Component Value Date   CREATININE 1.10 02/05/2019   BUN 8 02/05/2019   NA 140 02/05/2019   K 4.2 02/05/2019   CL 101 02/05/2019   CO2 26 02/05/2019   Lab Results  Component Value Date   ALT 11 02/05/2019   AST 15 02/05/2019   ALKPHOS 85 02/05/2019   BILITOT 0.4 02/05/2019   Lab Results  Component Value Date   HGBA1C 5.5 02/05/2019   HGBA1C 5.6 07/17/2018   HGBA1C 5.7 (H) 10/30/2017   HGBA1C 5.9 07/16/2017   HGBA1C 5.9 (H) 03/08/2017   Lab Results  Component Value Date   INSULIN 6.5 02/05/2019   INSULIN 11.7 07/17/2018   INSULIN 15.6 10/30/2017   INSULIN 18.0 03/08/2017   Lab Results  Component Value Date   TSH 1.260 03/08/2017   Lab Results  Component Value Date   CHOL 137 07/17/2018   HDL 56 07/17/2018   LDLCALC 69 07/17/2018   TRIG 62 07/17/2018   CHOLHDL 3 07/16/2017   Lab Results  Component Value Date   WBC 8.6 03/08/2017   HGB  13.2 03/08/2017   HCT 41.5 03/08/2017   MCV 90 03/08/2017   PLT 285 03/08/2017   No results found for: IRON, TIBC, FERRITIN  Attestation Statements:   Reviewed by clinician on day of visit: allergies, medications, problem list, medical history, surgical history, family history, social history, and previous encounter notes.  IMarianna Payment, am acting as transcriptionist for Alois Cliche, PA-C   I have reviewed the above documentation for accuracy and completeness, and I agree with the above. Alois Cliche, PA-C

## 2019-05-16 LAB — COMPREHENSIVE METABOLIC PANEL
ALT: 10 IU/L (ref 0–44)
AST: 16 IU/L (ref 0–40)
Albumin/Globulin Ratio: 1.3 (ref 1.2–2.2)
Albumin: 4 g/dL (ref 4.0–5.0)
Alkaline Phosphatase: 70 IU/L (ref 39–117)
BUN/Creatinine Ratio: 8 — ABNORMAL LOW (ref 9–20)
BUN: 9 mg/dL (ref 6–24)
Bilirubin Total: 0.5 mg/dL (ref 0.0–1.2)
CO2: 26 mmol/L (ref 20–29)
Calcium: 9 mg/dL (ref 8.7–10.2)
Chloride: 103 mmol/L (ref 96–106)
Creatinine, Ser: 1.12 mg/dL (ref 0.76–1.27)
GFR calc Af Amer: 92 mL/min/{1.73_m2} (ref >59)
GFR calc non Af Amer: 79 mL/min/{1.73_m2} (ref >59)
Globulin, Total: 3 g/dL (ref 1.5–4.5)
Glucose: 84 mg/dL (ref 65–99)
Potassium: 4.5 mmol/L (ref 3.5–5.2)
Sodium: 142 mmol/L (ref 134–144)
Total Protein: 7 g/dL (ref 6.0–8.5)

## 2019-05-16 LAB — HEMOGLOBIN A1C
Est. average glucose Bld gHb Est-mCnc: 108 mg/dL
Hgb A1c MFr Bld: 5.4 % (ref 4.8–5.6)

## 2019-05-16 LAB — VITAMIN D 25 HYDROXY (VIT D DEFICIENCY, FRACTURES): Vit D, 25-Hydroxy: 48.4 ng/mL (ref 30.0–100.0)

## 2019-05-16 LAB — INSULIN, RANDOM: INSULIN: 9.1 u[IU]/mL (ref 2.6–24.9)

## 2019-06-05 ENCOUNTER — Encounter (INDEPENDENT_AMBULATORY_CARE_PROVIDER_SITE_OTHER): Payer: Self-pay | Admitting: Physician Assistant

## 2019-06-05 ENCOUNTER — Other Ambulatory Visit: Payer: Self-pay

## 2019-06-05 ENCOUNTER — Ambulatory Visit (INDEPENDENT_AMBULATORY_CARE_PROVIDER_SITE_OTHER): Payer: PRIVATE HEALTH INSURANCE | Admitting: Physician Assistant

## 2019-06-05 VITALS — BP 112/74 | HR 73 | Temp 98.6°F | Ht 72.0 in | Wt 344.0 lb

## 2019-06-05 DIAGNOSIS — E559 Vitamin D deficiency, unspecified: Secondary | ICD-10-CM | POA: Diagnosis not present

## 2019-06-05 DIAGNOSIS — Z6841 Body Mass Index (BMI) 40.0 and over, adult: Secondary | ICD-10-CM

## 2019-06-05 DIAGNOSIS — Z9189 Other specified personal risk factors, not elsewhere classified: Secondary | ICD-10-CM | POA: Diagnosis not present

## 2019-06-05 DIAGNOSIS — R7303 Prediabetes: Secondary | ICD-10-CM

## 2019-06-05 MED ORDER — VITAMIN D (ERGOCALCIFEROL) 1.25 MG (50000 UNIT) PO CAPS: 50000.0000 [IU] | ORAL_CAPSULE | ORAL | 0 refills | Status: DC

## 2019-06-05 NOTE — Progress Notes (Signed)
Chief Complaint:   OBESITY Louis [name] is here to discuss his progress with his obesity treatment plan along with follow-up of his obesity related diagnoses. Louis [name] is on the Category 3 Plan and states he is following his eating plan approximately 90% of the time. Louis [name] states he is walking 1 mile 5 times per week.  Today's visit was #: 37 Starting weight: 406 lbs Starting date: 03/08/2017 Today's weight: 344 lbs Today's date: 06/05/2019 Total lbs lost to date: 62 Total lbs lost since last in-office visit: 0  Interim History: Louis [name] states that he has been eating on plan very well. He is taking off his CPAP in the middle of the night most nights.  Subjective:   Prediabetes. Louis [name] has a diagnosis of prediabetes based on his elevated HgA1c and was informed this puts him at greater risk of developing diabetes. He continues to work on diet and exercise to decrease his risk of diabetes. He denies nausea or hypoglycemia. Louis [name] is on Rybelsus and metformin. No nausea, vomiting, diarrhea, or polyphagia.   Lab Results  Component Value Date   HGBA1C 5.4 05/15/2019   Lab Results  Component Value Date   INSULIN 9.1 05/15/2019   INSULIN 6.5 02/05/2019   INSULIN 11.7 07/17/2018   INSULIN 15.6 10/30/2017   INSULIN 18.0 03/08/2017   Vitamin D deficiency. Louis [name] is on prescription Vitamin D weekly. No nausea, vomiting, or muscle weakness. Last Vitamin D was 48.4 on 05/15/2019.  At risk for diabetes mellitus. Louis [name] is at higher than average risk for developing diabetes due to his obesity.   Assessment/Plan:   Prediabetes. Louis [name] will continue to work on weight loss, exercise, and decreasing simple carbohydrates to help decrease the risk of diabetes. He will continue Rybelsus as directed and discontinue metformin.  Vitamin D deficiency. Low Vitamin D level contributes to fatigue and are associated with obesity, breast, and colon cancer. He was given a refill on his Vitamin D,  Ergocalciferol, (DRISDOL) 1.25 MG (50000 UNIT) CAPS capsule every week #4 with 0 refills and will follow-up for routine testing of Vitamin D, at least 2-3 times per year to avoid over-replacement.   At risk for diabetes mellitus. Louis [name] was given approximately 15 minutes of diabetes education and counseling today. We discussed intensive lifestyle modifications today with an emphasis on weight loss as well as increasing exercise and decreasing simple carbohydrates in his diet. We also reviewed medication options with an emphasis on risk versus benefit of those discussed.   Repetitive spaced learning was employed today to elicit superior memory formation and behavioral change.  Class 3 severe obesity with serious comorbidity and body mass index (BMI) of 45.0 to 49.9 in adult, unspecified obesity type (HCC).  Louis [name] is currently in the action stage of change. As such, his goal is to continue with weight loss efforts. He has agreed to the Category 3 Plan.   Exercise goals: For substantial health benefits, adults should do at least 150 minutes (2 hours and 30 minutes) a week of moderate-intensity, or 75 minutes (1 hour and 15 minutes) a week of vigorous-intensity aerobic physical activity, or an equivalent combination of moderate- and vigorous-intensity aerobic activity. Aerobic activity should be performed in episodes of at least 10 minutes, and preferably, it should be spread throughout the week.  Behavioral modification strategies: meal planning and cooking strategies and keeping healthy foods in the home.  Louis [name] has agreed to follow-up with our clinic in 3 weeks. He was informed of the importance  of frequent follow-up visits to maximize his success with intensive lifestyle modifications for his multiple health conditions.   Objective:   Blood pressure 112/74, pulse 73, temperature 98.6 F (37 C), temperature source Oral, height 6' (1.829 m), weight (!) 344 lb (156 kg), SpO2 97 %. Body mass index is  46.65 kg/m.  General: Cooperative, alert, well developed, in no acute distress. HEENT: Conjunctivae and lids unremarkable. Cardiovascular: Regular rhythm.  Lungs: Normal work of breathing. Neurologic: No focal deficits.   Lab Results  Component Value Date   CREATININE 1.12 05/15/2019   BUN 9 05/15/2019   NA 142 05/15/2019   K 4.5 05/15/2019   CL 103 05/15/2019   CO2 26 05/15/2019   Lab Results  Component Value Date   ALT 10 05/15/2019   AST 16 05/15/2019   ALKPHOS 70 05/15/2019   BILITOT 0.5 05/15/2019   Lab Results  Component Value Date   HGBA1C 5.4 05/15/2019   HGBA1C 5.5 02/05/2019   HGBA1C 5.6 07/17/2018   HGBA1C 5.7 (H) 10/30/2017   HGBA1C 5.9 07/16/2017   Lab Results  Component Value Date   INSULIN 9.1 05/15/2019   INSULIN 6.5 02/05/2019   INSULIN 11.7 07/17/2018   INSULIN 15.6 10/30/2017   INSULIN 18.0 03/08/2017   Lab Results  Component Value Date   TSH 1.260 03/08/2017   Lab Results  Component Value Date   CHOL 137 07/17/2018   HDL 56 07/17/2018   LDLCALC 69 07/17/2018   TRIG 62 07/17/2018   CHOLHDL 3 07/16/2017   Lab Results  Component Value Date   WBC 8.6 03/08/2017   HGB 13.2 03/08/2017   HCT 41.5 03/08/2017   MCV 90 03/08/2017   PLT 285 03/08/2017   No results found for: IRON, TIBC, FERRITIN  Attestation Statements:   Reviewed by clinician on day of visit: allergies, medications, problem list, medical history, surgical history, family history, social history, and previous encounter notes.  IMichaelene Song, am acting as transcriptionist for Abby Potash, PA-C   I have reviewed the above documentation for accuracy and completeness, and I agree with the above. Abby Potash, PA-C

## 2019-06-08 ENCOUNTER — Other Ambulatory Visit (INDEPENDENT_AMBULATORY_CARE_PROVIDER_SITE_OTHER): Payer: Self-pay | Admitting: Physician Assistant

## 2019-06-08 DIAGNOSIS — E559 Vitamin D deficiency, unspecified: Secondary | ICD-10-CM

## 2019-07-02 ENCOUNTER — Encounter (INDEPENDENT_AMBULATORY_CARE_PROVIDER_SITE_OTHER): Payer: Self-pay | Admitting: Physician Assistant

## 2019-07-02 ENCOUNTER — Ambulatory Visit (INDEPENDENT_AMBULATORY_CARE_PROVIDER_SITE_OTHER): Payer: PRIVATE HEALTH INSURANCE | Admitting: Physician Assistant

## 2019-07-02 ENCOUNTER — Other Ambulatory Visit: Payer: Self-pay

## 2019-07-02 VITALS — BP 141/93 | HR 69 | Temp 98.1°F | Ht 72.0 in | Wt 345.0 lb

## 2019-07-02 DIAGNOSIS — R7303 Prediabetes: Secondary | ICD-10-CM

## 2019-07-02 DIAGNOSIS — Z6841 Body Mass Index (BMI) 40.0 and over, adult: Secondary | ICD-10-CM

## 2019-07-02 DIAGNOSIS — Z9189 Other specified personal risk factors, not elsewhere classified: Secondary | ICD-10-CM

## 2019-07-02 DIAGNOSIS — E559 Vitamin D deficiency, unspecified: Secondary | ICD-10-CM

## 2019-07-02 MED ORDER — RYBELSUS 14 MG PO TABS: 14.0000 mg | ORAL_TABLET | Freq: Every day | ORAL | 0 refills | Status: DC

## 2019-07-02 MED ORDER — VITAMIN D (ERGOCALCIFEROL) 1.25 MG (50000 UNIT) PO CAPS: 50000.0000 [IU] | ORAL_CAPSULE | ORAL | 0 refills | Status: DC

## 2019-07-02 NOTE — Progress Notes (Signed)
Chief Complaint:   OBESITY Louis [name] is here to discuss his progress with his obesity treatment plan along with follow-up of his obesity related diagnoses. Louis [name] is on the Category 3 Plan and states he is following his eating plan approximately 80% of the time. Louis [name] states he is walking 1 mile 4 times per week.  Today's visit was #: 38 Starting weight: 406 lbs Starting date: 03/08/2017 Today's weight: 345 lbs Today's date: 07/02/2019 Total lbs lost to date: 61 Total lbs lost since last in-office visit: 0  Interim History: Louis [name] reports that he has been following the plan consistently. He is not happy with his current job and is looking at possibly changing jobs soon.  Subjective:   Vitamin D deficiency. Louis [name] is on prescription Vitamin D. No nausea, vomiting, or muscle weakness.    Ref. Range 05/15/2019 11:46  Vitamin D, 25-Hydroxy Latest Ref Range: 30.0 - 100.0 ng/mL 48.4   Prediabetes. Louis [name] has a diagnosis of prediabetes based on his elevated HgA1c and was informed this puts him at greater risk of developing diabetes. He continues to work on diet and exercise to decrease his risk of diabetes. He denies nausea or hypoglycemia. Louis [name] is on Louis [name]. No nausea, vomiting, diarrhea, or polyphagia.   Lab Results  Component Value Date   HGBA1C 5.4 05/15/2019   Lab Results  Component Value Date   INSULIN 9.1 05/15/2019   INSULIN 6.5 02/05/2019   INSULIN 11.7 07/17/2018   INSULIN 15.6 10/30/2017   INSULIN 18.0 03/08/2017   At risk for heart disease. Louis [name] is at a higher than average risk for cardiovascular disease due to obesity.   Assessment/Plan:   Vitamin D deficiency. Low Vitamin D level contributes to fatigue and are associated with obesity, breast, and colon cancer. He was given a refill on his Vitamin D, Ergocalciferol, (DRISDOL) 1.25 MG (50000 UNIT) CAPS capsule every week #4 with 0 refills and will follow-up for routine testing of Vitamin D, at least 2-3  times per year to avoid over-replacement.   Prediabetes. Louis [name] will continue to work on weight loss, exercise, and decreasing simple carbohydrates to help decrease the risk of diabetes. Refill was given for Semaglutide (Louis [name]) 14 MG TABS #30 with 0 refills.  At risk for heart disease. Louis [name] was given approximately 15 minutes of coronary artery disease prevention counseling today. He is 44 y.o. male and has risk factors for heart disease including obesity. We discussed intensive lifestyle modifications today with an emphasis on specific weight loss instructions and strategies.   Repetitive spaced learning was employed today to elicit superior memory formation and behavioral change.  Class 3 severe obesity with serious comorbidity and body mass index (BMI) of 45.0 to 49.9 in adult, unspecified obesity type ([facility]).  Louis [name] is currently in the action stage of change. As such, his goal is to continue with weight loss efforts. He has agreed to change plans and will now follow the Category 4 Plan.   Exercise goals: For substantial health benefits, adults should do at least 150 minutes (2 hours and 30 minutes) a week of moderate-intensity, or 75 minutes (1 hour and 15 minutes) a week of vigorous-intensity aerobic physical activity, or an equivalent combination of moderate- and vigorous-intensity aerobic activity. Aerobic activity should be performed in episodes of at least 10 minutes, and preferably, it should be spread throughout the week.  Behavioral modification strategies: meal planning and cooking strategies and keeping healthy foods in the home.  Louis [name] has agreed to  follow-up with our clinic in 3 weeks. He was informed of the importance of frequent follow-up visits to maximize his success with intensive lifestyle modifications for his multiple health conditions.   Objective:   Blood pressure (!) 141/93, pulse 69, temperature 98.1 F (36.7 C), temperature source Oral, height 6' (1.829 m), weight  (!) 345 lb (156.5 kg), SpO2 96 %. Body mass index is 46.79 kg/m.  General: Cooperative, alert, well developed, in no acute distress. HEENT: Conjunctivae and lids unremarkable. Cardiovascular: Regular rhythm.  Lungs: Normal work of breathing. Neurologic: No focal deficits.   Lab Results  Component Value Date   CREATININE 1.12 05/15/2019   BUN 9 05/15/2019   NA 142 05/15/2019   K 4.5 05/15/2019   CL 103 05/15/2019   CO2 26 05/15/2019   Lab Results  Component Value Date   ALT 10 05/15/2019   AST 16 05/15/2019   ALKPHOS 70 05/15/2019   BILITOT 0.5 05/15/2019   Lab Results  Component Value Date   HGBA1C 5.4 05/15/2019   HGBA1C 5.5 02/05/2019   HGBA1C 5.6 07/17/2018   HGBA1C 5.7 (H) 10/30/2017   HGBA1C 5.9 07/16/2017   Lab Results  Component Value Date   INSULIN 9.1 05/15/2019   INSULIN 6.5 02/05/2019   INSULIN 11.7 07/17/2018   INSULIN 15.6 10/30/2017   INSULIN 18.0 03/08/2017   Lab Results  Component Value Date   TSH 1.260 03/08/2017   Lab Results  Component Value Date   CHOL 137 07/17/2018   HDL 56 07/17/2018   LDLCALC 69 07/17/2018   TRIG 62 07/17/2018   CHOLHDL 3 07/16/2017   Lab Results  Component Value Date   WBC 8.6 03/08/2017   HGB 13.2 03/08/2017   HCT 41.5 03/08/2017   MCV 90 03/08/2017   PLT 285 03/08/2017   No results found for: IRON, TIBC, FERRITIN  Attestation Statements:   Reviewed by clinician on day of visit: allergies, medications, problem list, medical history, surgical history, family history, social history, and previous encounter notes.  IMarianna Payment, am acting as transcriptionist for Alois Cliche, PA-C   I have reviewed the above documentation for accuracy and completeness, and I agree with the above. Alois Cliche, PA-C

## 2019-07-23 ENCOUNTER — Ambulatory Visit (INDEPENDENT_AMBULATORY_CARE_PROVIDER_SITE_OTHER): Payer: PRIVATE HEALTH INSURANCE | Admitting: Physician Assistant

## 2019-07-23 ENCOUNTER — Other Ambulatory Visit: Payer: Self-pay

## 2019-07-23 ENCOUNTER — Encounter (INDEPENDENT_AMBULATORY_CARE_PROVIDER_SITE_OTHER): Payer: Self-pay | Admitting: Physician Assistant

## 2019-07-23 VITALS — BP 138/91 | HR 71 | Temp 98.5°F | Ht 72.0 in | Wt 343.0 lb

## 2019-07-23 DIAGNOSIS — Z9189 Other specified personal risk factors, not elsewhere classified: Secondary | ICD-10-CM

## 2019-07-23 DIAGNOSIS — E559 Vitamin D deficiency, unspecified: Secondary | ICD-10-CM | POA: Diagnosis not present

## 2019-07-23 DIAGNOSIS — R7303 Prediabetes: Secondary | ICD-10-CM | POA: Diagnosis not present

## 2019-07-23 DIAGNOSIS — Z6841 Body Mass Index (BMI) 40.0 and over, adult: Secondary | ICD-10-CM

## 2019-07-23 DIAGNOSIS — E66813 Obesity, class 3: Secondary | ICD-10-CM

## 2019-07-23 MED ORDER — VITAMIN D (ERGOCALCIFEROL) 1.25 MG (50000 UNIT) PO CAPS: 50000.0000 [IU] | ORAL_CAPSULE | ORAL | 0 refills | Status: DC

## 2019-07-23 NOTE — Progress Notes (Signed)
Chief Complaint:   OBESITY Louis [name] is here to discuss his progress with his obesity treatment plan along with follow-up of his obesity related diagnoses. Louis [name] is on the Category 4 Plan and states he is following his eating plan approximately 90% of the time. Louis [name] states he is walking 1 mile every other day.   Today's visit was #: 39 Starting weight: 406 lbs Starting date: 03/08/2017 Today's weight: 343 lbs Today's date: 07/23/2019 Total lbs lost to date: 63 Total lbs lost since last in-office visit: 2  Interim History: Louis [name] has been under a lot of stress due to separation with his wife recently and is not always eating on plan.  Subjective:   Vitamin D deficiency. Griff is on prescription Vitamin D supplementation.. No nausea, vomiting, or muscle weakness.    Ref. Range 05/15/2019 11:46  Vitamin D, 25-Hydroxy Latest Ref Range: 30.0 - 100.0 ng/mL 48.4   Prediabetes. Louis [name] has a diagnosis of prediabetes based on his elevated HgA1c and was informed this puts him at greater risk of developing diabetes. He continues to work on diet and exercise to decrease his risk of diabetes. He denies nausea or hypoglycemia. Louis [name] is on Rybelsus. No nausea, vomiting, or diarrhea.   Lab Results  Component Value Date   HGBA1C 5.4 05/15/2019   Lab Results  Component Value Date   INSULIN 9.1 05/15/2019   INSULIN 6.5 02/05/2019   INSULIN 11.7 07/17/2018   INSULIN 15.6 10/30/2017   INSULIN 18.0 03/08/2017   At risk for diabetes mellitus. Louis [name] is at higher than average risk for developing diabetes due to his obesity.   Assessment/Plan:   Vitamin D deficiency. Low Vitamin D level contributes to fatigue and are associated with obesity, breast, and colon cancer. He was given a refill on his Vitamin D, Ergocalciferol, (DRISDOL) 1.25 MG (50000 UNIT) CAPS capsule every week #4 with 0 refills and will follow-up for routine testing of Vitamin D, at least 2-3 times per year to avoid  over-replacement.   Prediabetes. Louis [name] will continue to work on weight loss, exercise, and decreasing simple carbohydrates to help decrease the risk of diabetes. He will continue his medication as directed.   At risk for diabetes mellitus. Louis [name] was given approximately 15 minutes of diabetes education and counseling today. We discussed intensive lifestyle modifications today with an emphasis on weight loss as well as increasing exercise and decreasing simple carbohydrates in his diet. We also reviewed medication options with an emphasis on risk versus benefit of those discussed.   Repetitive spaced learning was employed today to elicit superior memory formation and behavioral change.  Class 3 severe obesity with serious comorbidity and body mass index (BMI) of 45.0 to 49.9 in adult, unspecified obesity type ([facility]CC).  Louis [name] is currently in the action stage of change. As such, his goal is to continue with weight loss efforts. He has agreed to the Category 4 Plan.   Exercise goals: For substantial health benefits, adults should do at least 150 minutes (2 hours and 30 minutes) a week of moderate-intensity, or 75 minutes (1 hour and 15 minutes) a week of vigorous-intensity aerobic physical activity, or an equivalent combination of moderate- and vigorous-intensity aerobic activity. Aerobic activity should be performed in episodes of at least 10 minutes, and preferably, it should be spread throughout the week.  Behavioral modification strategies: meal planning and cooking strategies and keeping healthy foods in the home.  Louis [name] has agreed to follow-up with our clinic in 3 weeks.  He was informed of the importance of frequent follow-up visits to maximize his success with intensive lifestyle modifications for his multiple health conditions.   Objective:   Blood pressure (!) 138/91, pulse 71, temperature 98.5 F (36.9 C), temperature source Oral, height 6' (1.829 m), weight (!) 343 lb (155.6 kg), SpO2 97  %. Body mass index is 46.52 kg/m.  General: Cooperative, alert, well developed, in no acute distress. HEENT: Conjunctivae and lids unremarkable. Cardiovascular: Regular rhythm.  Lungs: Normal work of breathing. Neurologic: No focal deficits.   Lab Results  Component Value Date   CREATININE 1.12 05/15/2019   BUN 9 05/15/2019   NA 142 05/15/2019   K 4.5 05/15/2019   CL 103 05/15/2019   CO2 26 05/15/2019   Lab Results  Component Value Date   ALT 10 05/15/2019   AST 16 05/15/2019   ALKPHOS 70 05/15/2019   BILITOT 0.5 05/15/2019   Lab Results  Component Value Date   HGBA1C 5.4 05/15/2019   HGBA1C 5.5 02/05/2019   HGBA1C 5.6 07/17/2018   HGBA1C 5.7 (H) 10/30/2017   HGBA1C 5.9 07/16/2017   Lab Results  Component Value Date   INSULIN 9.1 05/15/2019   INSULIN 6.5 02/05/2019   INSULIN 11.7 07/17/2018   INSULIN 15.6 10/30/2017   INSULIN 18.0 03/08/2017   Lab Results  Component Value Date   TSH 1.260 03/08/2017   Lab Results  Component Value Date   CHOL 137 07/17/2018   HDL 56 07/17/2018   LDLCALC 69 07/17/2018   TRIG 62 07/17/2018   CHOLHDL 3 07/16/2017   Lab Results  Component Value Date   WBC 8.6 03/08/2017   HGB 13.2 03/08/2017   HCT 41.5 03/08/2017   MCV 90 03/08/2017   PLT 285 03/08/2017   No results found for: IRON, TIBC, FERRITIN  Attestation Statements:   Reviewed by clinician on day of visit: allergies, medications, problem list, medical history, surgical history, family history, social history, and previous encounter notes.  IMarianna Payment, am acting as transcriptionist for Alois Cliche, PA-C   I have reviewed the above documentation for accuracy and completeness, and I agree with the above. Alois Cliche, PA-C

## 2019-07-25 ENCOUNTER — Other Ambulatory Visit (INDEPENDENT_AMBULATORY_CARE_PROVIDER_SITE_OTHER): Payer: Self-pay | Admitting: Physician Assistant

## 2019-07-25 DIAGNOSIS — E559 Vitamin D deficiency, unspecified: Secondary | ICD-10-CM

## 2019-08-13 ENCOUNTER — Other Ambulatory Visit: Payer: Self-pay

## 2019-08-13 ENCOUNTER — Encounter (INDEPENDENT_AMBULATORY_CARE_PROVIDER_SITE_OTHER): Payer: Self-pay | Admitting: Physician Assistant

## 2019-08-13 ENCOUNTER — Ambulatory Visit (INDEPENDENT_AMBULATORY_CARE_PROVIDER_SITE_OTHER): Payer: PRIVATE HEALTH INSURANCE | Admitting: Physician Assistant

## 2019-08-13 VITALS — BP 135/95 | HR 73 | Temp 98.2°F | Ht 72.0 in | Wt 339.0 lb

## 2019-08-13 DIAGNOSIS — R7303 Prediabetes: Secondary | ICD-10-CM | POA: Diagnosis not present

## 2019-08-13 DIAGNOSIS — Z9189 Other specified personal risk factors, not elsewhere classified: Secondary | ICD-10-CM

## 2019-08-13 DIAGNOSIS — E559 Vitamin D deficiency, unspecified: Secondary | ICD-10-CM | POA: Diagnosis not present

## 2019-08-13 DIAGNOSIS — Z6841 Body Mass Index (BMI) 40.0 and over, adult: Secondary | ICD-10-CM

## 2019-08-13 MED ORDER — VITAMIN D (ERGOCALCIFEROL) 1.25 MG (50000 UNIT) PO CAPS: 50000.0000 [IU] | ORAL_CAPSULE | ORAL | 0 refills | Status: DC

## 2019-08-13 MED ORDER — RYBELSUS 14 MG PO TABS: 14.0000 mg | ORAL_TABLET | Freq: Every day | ORAL | 0 refills | Status: DC

## 2019-08-13 NOTE — Progress Notes (Signed)
Chief Complaint:   OBESITY Louis [name] is here to discuss his progress with his obesity treatment plan along with follow-up of his obesity related diagnoses. Louis [name] is on the Category 4 Plan and states he is following his eating plan approximately 80% of the time. Louis [name] states he is walking 1 mile every other day.   Today's visit was #: 40 Starting weight: 406 lbs Starting date: 03/08/2017 Today's weight: 339 lbs Today's date: 08/13/2019 Total lbs lost to date: 67 Total lbs lost since last in-office visit: 4  Interim History: Louis [name] states that he has started eating breakfast on plan  in the morning, where previously he was not eating all of the food for that meal. He does a good job staying on plan with his other meals.  Subjective:   Prediabetes.  [name] has a diagnosis of prediabetes based on his elevated HgA1c and was informed this puts him at greater risk of developing diabetes. He continues to work on diet and exercise to decrease his risk of diabetes. He denies nausea or hypoglycemia. Louis [name] is on Rybelsus. No nausea, vomiting, or diarrhea.   Lab Results  Component Value Date   HGBA1C 5.4 05/15/2019   Lab Results  Component Value Date   INSULIN 9.1 05/15/2019   INSULIN 6.5 02/05/2019   INSULIN 11.7 07/17/2018   INSULIN 15.6 10/30/2017   INSULIN 18.0 03/08/2017   Vitamin D deficiency. Harlan is on prescription Vitamin D supplementation. No nausea, vomiting, or muscle weakness.    Ref. Range 05/15/2019 11:46  Vitamin D, 25-Hydroxy Latest Ref Range: 30.0 - 100.0 ng/mL 48.4   At risk for heart disease. Louis [name] is at a higher than average risk for cardiovascular disease due to obesity.   Assessment/Plan:   Prediabetes. [name] will continue to work on weight loss, exercise, and decreasing simple carbohydrates to help decrease the risk of diabetes. Refill was given for Semaglutide (RYBELSUS) 14 MG TABS #30 with 0 refills.  Vitamin D deficiency. Low Vitamin D level  contributes to fatigue and are associated with obesity, breast, and colon cancer. He was given a refill on his Vitamin D, Ergocalciferol, (DRISDOL) 1.25 MG (50000 UNIT) CAPS capsule every week #4 with 0 refills and will follow-up for routine testing of Vitamin D, at least 2-3 times per year to avoid over-replacement.   At risk for heart disease. Louis [name] was given approximately 15 minutes of coronary artery disease prevention counseling today. He is 44 y.o. male and has risk factors for heart disease including obesity. We discussed intensive lifestyle modifications today with an emphasis on specific weight loss instructions and strategies.   Repetitive spaced learning was employed today to elicit superior memory formation and behavioral change.  Class 3 severe obesity with serious comorbidity and body mass index (BMI) of 45.0 to 49.9 in adult, unspecified obesity type (HCC).  Louis [name] is currently in the action stage of change. As such, his goal is to continue with weight loss efforts. He has agreed to the Category 4 Plan.   Exercise goals: For substantial health benefits, adults should do at least 150 minutes (2 hours and 30 minutes) a week of moderate-intensity, or 75 minutes (1 hour and 15 minutes) a week of vigorous-intensity aerobic physical activity, or an equivalent combination of moderate- and vigorous-intensity aerobic activity. Aerobic activity should be performed in episodes of at least 10 minutes, and preferably, it should be spread throughout the week.  Behavioral modification strategies: meal planning and cooking strategies.  Louis [name] has agreed  to follow-up with our clinic in 3 weeks. He was informed of the importance of frequent follow-up visits to maximize his success with intensive lifestyle modifications for his multiple health conditions.   Objective:   Blood pressure (!) 135/95, pulse 73, temperature 98.2 F (36.8 C), temperature source Oral, height 6' (1.829 m), weight (!) 339 lb  (153.8 kg), SpO2 98 %. Body mass index is 45.98 kg/m.  General: Cooperative, alert, well developed, in no acute distress. HEENT: Conjunctivae and lids unremarkable. Cardiovascular: Regular rhythm.  Lungs: Normal work of breathing. Neurologic: No focal deficits.   Lab Results  Component Value Date   CREATININE 1.12 05/15/2019   BUN 9 05/15/2019   NA 142 05/15/2019   K 4.5 05/15/2019   CL 103 05/15/2019   CO2 26 05/15/2019   Lab Results  Component Value Date   ALT 10 05/15/2019   AST 16 05/15/2019   ALKPHOS 70 05/15/2019   BILITOT 0.5 05/15/2019   Lab Results  Component Value Date   HGBA1C 5.4 05/15/2019   HGBA1C 5.5 02/05/2019   HGBA1C 5.6 07/17/2018   HGBA1C 5.7 (H) 10/30/2017   HGBA1C 5.9 07/16/2017   Lab Results  Component Value Date   INSULIN 9.1 05/15/2019   INSULIN 6.5 02/05/2019   INSULIN 11.7 07/17/2018   INSULIN 15.6 10/30/2017   INSULIN 18.0 03/08/2017   Lab Results  Component Value Date   TSH 1.260 03/08/2017   Lab Results  Component Value Date   CHOL 137 07/17/2018   HDL 56 07/17/2018   LDLCALC 69 07/17/2018   TRIG 62 07/17/2018   CHOLHDL 3 07/16/2017   Lab Results  Component Value Date   WBC 8.6 03/08/2017   HGB 13.2 03/08/2017   HCT 41.5 03/08/2017   MCV 90 03/08/2017   PLT 285 03/08/2017   No results found for: IRON, TIBC, FERRITIN  Attestation Statements:   Reviewed by clinician on day of visit: allergies, medications, problem list, medical history, surgical history, family history, social history, and previous encounter notes.  IMarianna Payment, am acting as transcriptionist for Louis Cliche, PA-C   I have reviewed the above documentation for accuracy and completeness, and I agree with the above. Louis Cliche, PA-C

## 2019-09-03 ENCOUNTER — Ambulatory Visit (INDEPENDENT_AMBULATORY_CARE_PROVIDER_SITE_OTHER): Payer: PRIVATE HEALTH INSURANCE | Admitting: Physician Assistant

## 2019-09-10 ENCOUNTER — Encounter (INDEPENDENT_AMBULATORY_CARE_PROVIDER_SITE_OTHER): Payer: Self-pay

## 2019-09-10 ENCOUNTER — Ambulatory Visit (INDEPENDENT_AMBULATORY_CARE_PROVIDER_SITE_OTHER): Payer: PRIVATE HEALTH INSURANCE | Admitting: Physician Assistant

## 2019-09-28 ENCOUNTER — Other Ambulatory Visit (INDEPENDENT_AMBULATORY_CARE_PROVIDER_SITE_OTHER): Payer: Self-pay | Admitting: Physician Assistant

## 2019-09-28 DIAGNOSIS — E559 Vitamin D deficiency, unspecified: Secondary | ICD-10-CM

## 2019-10-01 ENCOUNTER — Ambulatory Visit (INDEPENDENT_AMBULATORY_CARE_PROVIDER_SITE_OTHER): Payer: PRIVATE HEALTH INSURANCE | Admitting: Physician Assistant

## 2019-10-01 ENCOUNTER — Other Ambulatory Visit: Payer: Self-pay

## 2019-10-01 ENCOUNTER — Encounter (INDEPENDENT_AMBULATORY_CARE_PROVIDER_SITE_OTHER): Payer: Self-pay | Admitting: Physician Assistant

## 2019-10-01 VITALS — BP 126/77 | HR 66 | Temp 98.1°F | Ht 72.0 in | Wt 348.0 lb

## 2019-10-01 DIAGNOSIS — Z9189 Other specified personal risk factors, not elsewhere classified: Secondary | ICD-10-CM

## 2019-10-01 DIAGNOSIS — R7303 Prediabetes: Secondary | ICD-10-CM

## 2019-10-01 DIAGNOSIS — E559 Vitamin D deficiency, unspecified: Secondary | ICD-10-CM | POA: Diagnosis not present

## 2019-10-01 DIAGNOSIS — Z6841 Body Mass Index (BMI) 40.0 and over, adult: Secondary | ICD-10-CM

## 2019-10-01 MED ORDER — RYBELSUS 14 MG PO TABS: 14.0000 mg | ORAL_TABLET | Freq: Every day | ORAL | 0 refills | Status: DC

## 2019-10-01 MED ORDER — VITAMIN D (ERGOCALCIFEROL) 1.25 MG (50000 UNIT) PO CAPS: 50000.0000 [IU] | ORAL_CAPSULE | ORAL | 0 refills | Status: DC

## 2019-10-01 NOTE — Progress Notes (Signed)
Chief Complaint:   OBESITY Louis [name] is here to discuss his progress with his obesity treatment plan along with follow-up of his obesity related diagnoses. Louis [name] is on the Category 4 Plan and states he is following his eating plan approximately 80% of the time. Louis [name] states he is walking for 20 minutes 7 times per week.  Today's visit was #: 41 Starting weight: 406 lbs Starting date: 03/08/2017 Today's weight: 348 lbs Today's date: 10/01/2019 Total lbs lost to date: 58 Total lbs lost since last in-office visit: 0  Interim History: Louis [name] has been dealing with a lot of family stress recently. He has not been sleeping well. He states that he has been more sedentary due to emotional fatigue, but that he is eating on the plan.  Subjective:   1. Vitamin D deficiency Louis [name] is on Vit D weekly. He is due for labs next visit.  2. Pre-diabetes Louis [name] is on Rybelsus, and his hunger is controlled.  3. At risk for diabetes mellitus Louis [name] is at higher than average risk for developing diabetes due to his obesity.   Assessment/Plan:   1. Vitamin D deficiency Low Vitamin D level contributes to fatigue and are associated with obesity, breast, and colon cancer. We will refill prescription Vitamin D for 1 month. Louis [name] will follow-up for routine testing of Vitamin D, at least 2-3 times per year to avoid over-replacement.  - Vitamin D, Ergocalciferol, (DRISDOL) 1.25 MG (50000 UNIT) CAPS capsule; Take 1 capsule (50,000 Units total) by mouth every 7 (seven) days.  Dispense: 4 capsule; Refill: 0  2. Pre-diabetes Louis [name] will continue to work on weight loss, exercise, and decreasing simple carbohydrates to help decrease the risk of diabetes. We will refill Rybelsus for 1 month.  - Semaglutide (RYBELSUS) 14 MG TABS; Take 14 mg by mouth daily.  Dispense: 30 tablet; Refill: 0  3. At risk for diabetes mellitus Louis [name] was given approximately 15 minutes of diabetes education and counseling today. We discussed  intensive lifestyle modifications today with an emphasis on weight loss as well as increasing exercise and decreasing simple carbohydrates in his diet. We also reviewed medication options with an emphasis on risk versus benefit of those discussed.   Repetitive spaced learning was employed today to elicit superior memory formation and behavioral change.  4. Class 3 severe obesity with serious comorbidity and body mass index (BMI) of 45.0 to 49.9 in adult, unspecified obesity type (HCC) Louis [name] is currently in the action stage of change. As such, his goal is to continue with weight loss efforts. He has agreed to the Category 4 Plan.   We will check labs at his next visit.  Exercise goals: As is.  Behavioral modification strategies: meal planning and cooking strategies and keeping healthy foods in the home.  Louis [name] has agreed to follow-up with our clinic in 3 weeks. He was informed of the importance of frequent follow-up visits to maximize his success with intensive lifestyle modifications for his multiple health conditions.   Objective:   Blood pressure 126/77, pulse 66, temperature 98.1 F (36.7 C), height 6' (1.829 m), weight (!) 348 lb (157.9 kg), SpO2 96 %. Body mass index is 47.2 kg/m.  General: Cooperative, alert, well developed, in no acute distress. HEENT: Conjunctivae and lids unremarkable. Cardiovascular: Regular rhythm.  Lungs: Normal work of breathing. Neurologic: No focal deficits.   Lab Results  Component Value Date   CREATININE 1.12 05/15/2019   BUN 9 05/15/2019   NA 142 05/15/2019   K  4.5 05/15/2019   CL 103 05/15/2019   CO2 26 05/15/2019   Lab Results  Component Value Date   ALT 10 05/15/2019   AST 16 05/15/2019   ALKPHOS 70 05/15/2019   BILITOT 0.5 05/15/2019   Lab Results  Component Value Date   HGBA1C 5.4 05/15/2019   HGBA1C 5.5 02/05/2019   HGBA1C 5.6 07/17/2018   HGBA1C 5.7 (H) 10/30/2017   HGBA1C 5.9 07/16/2017   Lab Results  Component Value Date    INSULIN 9.1 05/15/2019   INSULIN 6.5 02/05/2019   INSULIN 11.7 07/17/2018   INSULIN 15.6 10/30/2017   INSULIN 18.0 03/08/2017   Lab Results  Component Value Date   TSH 1.260 03/08/2017   Lab Results  Component Value Date   CHOL 137 07/17/2018   HDL 56 07/17/2018   LDLCALC 69 07/17/2018   TRIG 62 07/17/2018   CHOLHDL 3 07/16/2017   Lab Results  Component Value Date   WBC 8.6 03/08/2017   HGB 13.2 03/08/2017   HCT 41.5 03/08/2017   MCV 90 03/08/2017   PLT 285 03/08/2017   No results found for: IRON, TIBC, FERRITIN  Attestation Statements:   Reviewed by clinician on day of visit: allergies, medications, problem list, medical history, surgical history, family history, social history, and previous encounter notes.   Trude Mcburney, am acting as transcriptionist for [employer], PA-C.  I have reviewed the above documentation for accuracy and completeness, and I agree with the above. Alois Cliche, PA-C

## 2019-10-22 ENCOUNTER — Other Ambulatory Visit: Payer: Self-pay

## 2019-10-22 ENCOUNTER — Ambulatory Visit (INDEPENDENT_AMBULATORY_CARE_PROVIDER_SITE_OTHER): Payer: PRIVATE HEALTH INSURANCE | Admitting: Physician Assistant

## 2019-10-22 ENCOUNTER — Encounter (INDEPENDENT_AMBULATORY_CARE_PROVIDER_SITE_OTHER): Payer: Self-pay | Admitting: Physician Assistant

## 2019-10-22 VITALS — BP 125/78 | HR 60 | Temp 97.9°F | Ht 72.0 in | Wt 348.0 lb

## 2019-10-22 DIAGNOSIS — E7849 Other hyperlipidemia: Secondary | ICD-10-CM

## 2019-10-22 DIAGNOSIS — R7303 Prediabetes: Secondary | ICD-10-CM | POA: Diagnosis not present

## 2019-10-22 DIAGNOSIS — E559 Vitamin D deficiency, unspecified: Secondary | ICD-10-CM

## 2019-10-22 DIAGNOSIS — Z9189 Other specified personal risk factors, not elsewhere classified: Secondary | ICD-10-CM

## 2019-10-22 DIAGNOSIS — Z6841 Body Mass Index (BMI) 40.0 and over, adult: Secondary | ICD-10-CM

## 2019-10-22 MED ORDER — VITAMIN D (ERGOCALCIFEROL) 1.25 MG (50000 UNIT) PO CAPS: 50000.0000 [IU] | ORAL_CAPSULE | ORAL | 0 refills | Status: DC

## 2019-10-23 LAB — COMPREHENSIVE METABOLIC PANEL
ALT: 12 IU/L (ref 0–44)
AST: 15 IU/L (ref 0–40)
Albumin/Globulin Ratio: 1.2 (ref 1.2–2.2)
Albumin: 3.9 g/dL — ABNORMAL LOW (ref 4.0–5.0)
Alkaline Phosphatase: 71 IU/L (ref 44–121)
BUN/Creatinine Ratio: 8 — ABNORMAL LOW (ref 9–20)
BUN: 8 mg/dL (ref 6–24)
Bilirubin Total: 0.4 mg/dL (ref 0.0–1.2)
CO2: 28 mmol/L (ref 20–29)
Calcium: 8.7 mg/dL (ref 8.7–10.2)
Chloride: 101 mmol/L (ref 96–106)
Creatinine, Ser: 1.05 mg/dL (ref 0.76–1.27)
GFR calc Af Amer: 99 mL/min/{1.73_m2} (ref >59)
GFR calc non Af Amer: 86 mL/min/{1.73_m2} (ref >59)
Globulin, Total: 3.3 g/dL (ref 1.5–4.5)
Glucose: 90 mg/dL (ref 65–99)
Potassium: 4.1 mmol/L (ref 3.5–5.2)
Sodium: 141 mmol/L (ref 134–144)
Total Protein: 7.2 g/dL (ref 6.0–8.5)

## 2019-10-23 LAB — LIPID PANEL
Chol/HDL Ratio: 2.7 ratio (ref 0.0–5.0)
Cholesterol, Total: 134 mg/dL (ref 100–199)
HDL: 49 mg/dL (ref >39)
LDL Chol Calc (NIH): 73 mg/dL (ref 0–99)
Triglycerides: 53 mg/dL (ref 0–149)
VLDL Cholesterol Cal: 12 mg/dL (ref 5–40)

## 2019-10-23 LAB — INSULIN, RANDOM: INSULIN: 10.4 u[IU]/mL (ref 2.6–24.9)

## 2019-10-23 LAB — VITAMIN D 25 HYDROXY (VIT D DEFICIENCY, FRACTURES): Vit D, 25-Hydroxy: 43.1 ng/mL (ref 30.0–100.0)

## 2019-10-23 LAB — HEMOGLOBIN A1C
Est. average glucose Bld gHb Est-mCnc: 105 mg/dL
Hgb A1c MFr Bld: 5.3 % (ref 4.8–5.6)

## 2019-10-27 NOTE — Progress Notes (Signed)
Chief Complaint:   OBESITY Louis [name] is here to discuss his progress with his obesity treatment plan along with follow-up of his obesity related diagnoses. Louis [name] is on the Category 4 Plan and states he is following his eating plan approximately 90% of the time. Louis [name] states he is walking 30 minutes 5 times per week.  Today's visit was #: 42 Starting weight: 406 lbs Starting date: 03/08/2017 Today's weight: 348 lbs Today's date: 10/22/2019 Total lbs lost to date: 58 Total lbs lost since last in-office visit: 0  Interim History: Louis [name] feels that the plan is going "fine", but he is under personal stress. He is not sleeping well.  Subjective:   Vitamin D deficiency. Louis [name] is on prescription Vitamin D weekly, which he is tolerating well. He is due for labs.   Ref. Range 05/15/2019 11:46  Vitamin D, 25-Hydroxy Latest Ref Range: 30.0 - 100.0 ng/mL 48.4   Prediabetes. Louis [name] has a diagnosis of prediabetes based on his elevated HgA1c and was informed this puts him at greater risk of developing diabetes. He continues to work on diet and exercise to decrease his risk of diabetes. He denies nausea or hypoglycemia. Louis [name] is on Rybelsus and denies polyphagia or hypoglycemia. He is due for labs. He notes he has been off of Rybelsus for the last 2 weeks as he ran out.  Lab Results  Component Value Date   INSULIN 9.1 05/15/2019   INSULIN 6.5 02/05/2019   INSULIN 11.7 07/17/2018   INSULIN 15.6 10/30/2017   Other hyperlipidemia. Louis [name] is on no medications and denies chest pain. He is walking frequently throughout the week. He is due for labs.  At risk for hypoglycemia. Louis [name] is at increased risk for hypoglycemia due to changes in diet, diagnosis of diabetes, and/or insulin use.   Assessment/Plan:   Vitamin D deficiency. Low Vitamin D level contributes to fatigue and are associated with obesity, breast, and colon cancer. He was given a refill on his Vitamin D, Ergocalciferol, (DRISDOL)  1.25 MG (50000 UNIT) CAPS capsule every week #4 with 0 refills and VITAMIN D 25 Hydroxy (Vit-D Deficiency, Fractures) level will be checked today.   Prediabetes.  Louis [name] will continue to work on weight loss, exercise, and decreasing simple carbohydrates to help decrease the risk of diabetes. He will continue Rybelsus as directed. Labs will be checked today.  Other hyperlipidemia.  Cardiovascular risk and specific lipid/LDL goals reviewed.  We discussed several lifestyle modifications today and Louis [name] will continue to work on diet, exercise and weight loss efforts. Orders and follow up as documented in patient record. Labs will be checked today.  Counseling Intensive lifestyle modifications are the first line treatment for this issue. . Dietary changes: Increase soluble fiber. Decrease simple carbohydrates. . Exercise changes: Moderate to vigorous-intensity aerobic activity 150 minutes per week if tolerated. . Lipid-lowering medications: see documented in medical record.  At risk for hypoglycemia. Louis [name] was given approximately 15 minutes of counseling today regarding prevention of hypoglycemia. He was advised of symptoms of hypoglycemia. Louis [name] was instructed to avoid skipping meals, eat regular protein rich meals and schedule low calorie snacks as needed.   Repetitive spaced learning was employed today to elicit superior memory formation and behavioral change.  Class 3 severe obesity with serious comorbidity and body mass index (BMI) of 45.0 to 49.9 in adult, unspecified obesity type (HCC).  Louis [name] is currently in the action stage of change. As such, his goal is to continue with weight loss efforts. He  has agreed to the Category 4 Plan.   Exercise goals: For substantial health benefits, adults should do at least 150 minutes (2 hours and 30 minutes) a week of moderate-intensity, or 75 minutes (1 hour and 15 minutes) a week of vigorous-intensity aerobic physical activity, or an equivalent combination  of moderate- and vigorous-intensity aerobic activity. Aerobic activity should be performed in episodes of at least 10 minutes, and preferably, it should be spread throughout the week.  Behavioral modification strategies: travel eating strategies and planning for success.  Louis [name] has agreed to follow-up with our clinic in 3 weeks. He was informed of the importance of frequent follow-up visits to maximize his success with intensive lifestyle modifications for his multiple health conditions.   Louis [name] was informed we would discuss his lab results at his next visit unless there is a critical issue that needs to be addressed sooner. Louis [name] agreed to keep his next visit at the agreed upon time to discuss these results.  Objective:   Blood pressure 125/78, pulse 60, temperature 97.9 F (36.6 C), height 6' (1.829 m), weight (!) 348 lb (157.9 kg), SpO2 97 %. Body mass index is 47.2 kg/m.  General: Cooperative, alert, well developed, in no acute distress. HEENT: Conjunctivae and lids unremarkable. Cardiovascular: Regular rhythm.  Lungs: Normal work of breathing. Neurologic: No focal deficits.   Lab Results  Component Value Date   HGBA1C 5.4 05/15/2019   HGBA1C 5.5 02/05/2019   HGBA1C 5.6 07/17/2018   HGBA1C 5.7 (H) 10/30/2017   Lab Results  Component Value Date   INSULIN 9.1 05/15/2019   INSULIN 6.5 02/05/2019   INSULIN 11.7 07/17/2018   INSULIN 15.6 10/30/2017   Lab Results  Component Value Date   TSH 1.260 03/08/2017   Lab Results  Component Value Date   WBC 8.6 03/08/2017   HGB 13.2 03/08/2017   HCT 41.5 03/08/2017   MCV 90 03/08/2017   PLT 285 03/08/2017   No results found for: IRON, TIBC, FERRITIN  Attestation Statements:   Reviewed by clinician on day of visit: allergies, medications, problem list, medical history, surgical history, family history, social history, and previous encounter notes.  IMarianna Payment, am acting as transcriptionist for Alois Cliche, PA-C   I  have reviewed the above documentation for accuracy and completeness, and I agree with the above. Alois Cliche, PA-C

## 2019-11-05 ENCOUNTER — Other Ambulatory Visit (INDEPENDENT_AMBULATORY_CARE_PROVIDER_SITE_OTHER): Payer: Self-pay | Admitting: Physician Assistant

## 2019-11-05 DIAGNOSIS — E559 Vitamin D deficiency, unspecified: Secondary | ICD-10-CM

## 2019-11-05 NOTE — Telephone Encounter (Signed)
Would you like for me to refill the pt's Vit. D?

## 2019-11-05 NOTE — Telephone Encounter (Signed)
Last seen by Tracey 

## 2019-11-06 ENCOUNTER — Other Ambulatory Visit (INDEPENDENT_AMBULATORY_CARE_PROVIDER_SITE_OTHER): Payer: Self-pay

## 2019-11-06 DIAGNOSIS — E559 Vitamin D deficiency, unspecified: Secondary | ICD-10-CM

## 2019-11-06 MED ORDER — VITAMIN D (ERGOCALCIFEROL) 1.25 MG (50000 UNIT) PO CAPS: 50000.0000 [IU] | ORAL_CAPSULE | ORAL | 1 refills | Status: DC

## 2019-11-06 NOTE — Telephone Encounter (Signed)
Refilling Vit. D for patient.

## 2019-11-06 NOTE — Telephone Encounter (Signed)
Ok to refill. Thanks 

## 2019-11-12 ENCOUNTER — Ambulatory Visit (INDEPENDENT_AMBULATORY_CARE_PROVIDER_SITE_OTHER): Payer: PRIVATE HEALTH INSURANCE | Admitting: Physician Assistant

## 2019-11-12 ENCOUNTER — Encounter (INDEPENDENT_AMBULATORY_CARE_PROVIDER_SITE_OTHER): Payer: Self-pay

## 2019-12-07 ENCOUNTER — Other Ambulatory Visit (INDEPENDENT_AMBULATORY_CARE_PROVIDER_SITE_OTHER): Payer: Self-pay | Admitting: Physician Assistant

## 2019-12-07 DIAGNOSIS — E559 Vitamin D deficiency, unspecified: Secondary | ICD-10-CM

## 2019-12-08 NOTE — Telephone Encounter (Signed)
Last seen by Tracey 

## 2019-12-08 NOTE — Telephone Encounter (Signed)
Left Pt a voicemail

## 2020-04-01 ENCOUNTER — Other Ambulatory Visit: Payer: Self-pay

## 2020-04-01 ENCOUNTER — Encounter (INDEPENDENT_AMBULATORY_CARE_PROVIDER_SITE_OTHER): Payer: Self-pay | Admitting: Physician Assistant

## 2020-04-01 ENCOUNTER — Ambulatory Visit (INDEPENDENT_AMBULATORY_CARE_PROVIDER_SITE_OTHER): Payer: PRIVATE HEALTH INSURANCE | Admitting: Physician Assistant

## 2020-04-01 VITALS — BP 134/82 | HR 68 | Temp 98.3°F | Ht 72.0 in | Wt 364.0 lb

## 2020-04-01 DIAGNOSIS — E559 Vitamin D deficiency, unspecified: Secondary | ICD-10-CM | POA: Diagnosis not present

## 2020-04-01 DIAGNOSIS — R7303 Prediabetes: Secondary | ICD-10-CM

## 2020-04-01 DIAGNOSIS — Z9189 Other specified personal risk factors, not elsewhere classified: Secondary | ICD-10-CM

## 2020-04-01 DIAGNOSIS — Z6841 Body Mass Index (BMI) 40.0 and over, adult: Secondary | ICD-10-CM

## 2020-04-01 MED ORDER — RYBELSUS 14 MG PO TABS: 14.0000 mg | ORAL_TABLET | Freq: Every day | ORAL | 0 refills | Status: DC

## 2020-04-01 MED ORDER — VITAMIN D (ERGOCALCIFEROL) 1.25 MG (50000 UNIT) PO CAPS: 50000.0000 [IU] | ORAL_CAPSULE | ORAL | 1 refills | Status: DC

## 2020-04-06 NOTE — Progress Notes (Signed)
Chief Complaint:   OBESITY Louis [name] is here to discuss his progress with his obesity treatment plan along with follow-up of his obesity related diagnoses. Louis [name] is on the Category 4 Plan and states he is following his eating plan approximately 80% of the time. Louis [name] states he is doing 0 minutes 0 times per week.  Today's visit was #: 43 Starting weight: 406 lbs Starting date: 03/08/2017 Today's weight: 364 lbs Today's date: 04/01/2020 Total lbs lost to date: 42 Total lbs lost since last in-office visit: 0  Interim History: Louis [name]'s last visit was on 10/22/2019. He reports that he "hibernated" over the Winter and he is ready to get back on track.  Subjective:   1. Pre-diabetes Louis [name] has been off Rybelsus for 3 months. He denies polyphagia.  2. Vitamin D deficiency Louis [name] has been off of Vit D for the last few months.  3. At risk for diabetes mellitus Louis [name] is at higher than average risk for developing diabetes due to obesity.   Assessment/Plan:   1. Pre-diabetes Louis [name] will continue to work on weight loss, exercise, and decreasing simple carbohydrates to help decrease the risk of diabetes. We will refill Rybelsus for 1 month.  - Semaglutide (RYBELSUS) 14 MG TABS; Take 14 mg by mouth daily.  Dispense: 30 tablet; Refill: 0  2. Vitamin D deficiency Low Vitamin D level contributes to fatigue and are associated with obesity, breast, and colon cancer. We will refill prescription Vitamin D for 2 months. Louis [name] will follow-up for routine testing of Vitamin D, at least 2-3 times per year to avoid over-replacement.  - Vitamin D, Ergocalciferol, (DRISDOL) 1.25 MG (50000 UNIT) CAPS capsule; Take 1 capsule (50,000 Units total) by mouth every 7 (seven) days.  Dispense: 4 capsule; Refill: 1  3. At risk for diabetes mellitus Louis [name] was given approximately 15 minutes of diabetes education and counseling today. We discussed intensive lifestyle modifications today with an emphasis on weight loss as  well as increasing exercise and decreasing simple carbohydrates in his diet. We also reviewed medication options with an emphasis on risk versus benefit of those discussed.   Repetitive spaced learning was employed today to elicit superior memory formation and behavioral change.  4. Class 3 severe obesity with serious comorbidity and body mass index (BMI) of 45.0 to 49.9 in adult, unspecified obesity type (HCC) Louis [name] is currently in the action stage of change. As such, his goal is to get back to weightloss efforts . He has agreed to the Category 4 Plan.   We will recheck fasting labs at his next visit.  Exercise goals: No exercise has been prescribed at this time.  Behavioral modification strategies: meal planning and cooking strategies and planning for success.  Louis [name] has agreed to follow-up with our clinic in 3 weeks. He was informed of the importance of frequent follow-up visits to maximize his success with intensive lifestyle modifications for his multiple health conditions.   Objective:   Blood pressure 134/82, pulse 68, temperature 98.3 F (36.8 C), height 6' (1.829 m), weight (!) 364 lb (165.1 kg), SpO2 96 %. Body mass index is 49.37 kg/m.  General: Cooperative, alert, well developed, in no acute distress. HEENT: Conjunctivae and lids unremarkable. Cardiovascular: Regular rhythm.  Lungs: Normal work of breathing. Neurologic: No focal deficits.   Lab Results  Component Value Date   CREATININE 1.05 10/22/2019   BUN 8 10/22/2019   NA 141 10/22/2019   K 4.1 10/22/2019   CL 101 10/22/2019   CO2  28 10/22/2019   Lab Results  Component Value Date   ALT 12 10/22/2019   AST 15 10/22/2019   ALKPHOS 71 10/22/2019   BILITOT 0.4 10/22/2019   Lab Results  Component Value Date   HGBA1C 5.3 10/22/2019   HGBA1C 5.4 05/15/2019   HGBA1C 5.5 02/05/2019   HGBA1C 5.6 07/17/2018   HGBA1C 5.7 (H) 10/30/2017   Lab Results  Component Value Date   INSULIN 10.4 10/22/2019   INSULIN  9.1 05/15/2019   INSULIN 6.5 02/05/2019   INSULIN 11.7 07/17/2018   INSULIN 15.6 10/30/2017   Lab Results  Component Value Date   TSH 1.260 03/08/2017   Lab Results  Component Value Date   CHOL 134 10/22/2019   HDL 49 10/22/2019   LDLCALC 73 10/22/2019   TRIG 53 10/22/2019   CHOLHDL 2.7 10/22/2019   Lab Results  Component Value Date   WBC 8.6 03/08/2017   HGB 13.2 03/08/2017   HCT 41.5 03/08/2017   MCV 90 03/08/2017   PLT 285 03/08/2017   No results found for: IRON, TIBC, FERRITIN  Attestation Statements:   Reviewed by clinician on day of visit: allergies, medications, problem list, medical history, surgical history, family history, social history, and previous encounter notes.   Trude Mcburney, am acting as transcriptionist for [employer], PA-C.  I have reviewed the above documentation for accuracy and completeness, and I agree with the above. Alois Cliche, PA-C

## 2020-04-28 ENCOUNTER — Encounter (INDEPENDENT_AMBULATORY_CARE_PROVIDER_SITE_OTHER): Payer: Self-pay | Admitting: Physician Assistant

## 2020-04-28 ENCOUNTER — Ambulatory Visit (INDEPENDENT_AMBULATORY_CARE_PROVIDER_SITE_OTHER): Payer: PRIVATE HEALTH INSURANCE | Admitting: Physician Assistant

## 2020-04-28 ENCOUNTER — Other Ambulatory Visit: Payer: Self-pay

## 2020-04-28 VITALS — BP 123/84 | HR 65 | Temp 98.1°F | Ht 72.0 in | Wt 355.0 lb

## 2020-04-28 DIAGNOSIS — Z6841 Body Mass Index (BMI) 40.0 and over, adult: Secondary | ICD-10-CM

## 2020-04-28 DIAGNOSIS — Z9189 Other specified personal risk factors, not elsewhere classified: Secondary | ICD-10-CM

## 2020-04-28 DIAGNOSIS — R7303 Prediabetes: Secondary | ICD-10-CM | POA: Diagnosis not present

## 2020-04-28 DIAGNOSIS — E559 Vitamin D deficiency, unspecified: Secondary | ICD-10-CM | POA: Diagnosis not present

## 2020-04-28 DIAGNOSIS — E7849 Other hyperlipidemia: Secondary | ICD-10-CM | POA: Diagnosis not present

## 2020-04-28 MED ORDER — RYBELSUS 14 MG PO TABS: 14.0000 mg | ORAL_TABLET | Freq: Every day | ORAL | 0 refills | Status: DC

## 2020-04-28 MED ORDER — VITAMIN D (ERGOCALCIFEROL) 1.25 MG (50000 UNIT) PO CAPS: 50000.0000 [IU] | ORAL_CAPSULE | ORAL | 1 refills | Status: DC

## 2020-04-29 LAB — COMPREHENSIVE METABOLIC PANEL WITH GFR
ALT: 10 IU/L (ref 0–44)
AST: 13 IU/L (ref 0–40)
Albumin/Globulin Ratio: 1.2 (ref 1.2–2.2)
Albumin: 4.1 g/dL (ref 4.0–5.0)
Alkaline Phosphatase: 71 IU/L (ref 44–121)
BUN/Creatinine Ratio: 9 (ref 9–20)
BUN: 11 mg/dL (ref 6–24)
Bilirubin Total: 0.5 mg/dL (ref 0.0–1.2)
CO2: 25 mmol/L (ref 20–29)
Calcium: 9.1 mg/dL (ref 8.7–10.2)
Chloride: 100 mmol/L (ref 96–106)
Creatinine, Ser: 1.17 mg/dL (ref 0.76–1.27)
Globulin, Total: 3.5 g/dL (ref 1.5–4.5)
Glucose: 75 mg/dL (ref 65–99)
Potassium: 4.3 mmol/L (ref 3.5–5.2)
Sodium: 143 mmol/L (ref 134–144)
Total Protein: 7.6 g/dL (ref 6.0–8.5)
eGFR: 78 mL/min/1.73

## 2020-04-29 LAB — LIPID PANEL
Chol/HDL Ratio: 3.2 ratio (ref 0.0–5.0)
Cholesterol, Total: 146 mg/dL (ref 100–199)
HDL: 45 mg/dL
LDL Chol Calc (NIH): 90 mg/dL (ref 0–99)
Triglycerides: 53 mg/dL (ref 0–149)
VLDL Cholesterol Cal: 11 mg/dL (ref 5–40)

## 2020-04-29 LAB — HEMOGLOBIN A1C
Est. average glucose Bld gHb Est-mCnc: 111 mg/dL
Hgb A1c MFr Bld: 5.5 % (ref 4.8–5.6)

## 2020-04-29 LAB — INSULIN, RANDOM: INSULIN: 4.2 u[IU]/mL (ref 2.6–24.9)

## 2020-04-29 LAB — VITAMIN D 25 HYDROXY (VIT D DEFICIENCY, FRACTURES): Vit D, 25-Hydroxy: 37.1 ng/mL (ref 30.0–100.0)

## 2020-04-29 NOTE — Progress Notes (Signed)
Chief Complaint:   OBESITY [place] is here to discuss his progress with his obesity treatment plan along with follow-up of his obesity related diagnoses. Guillaume is on the Category 4 Plan and states he is following his eating plan approximately 85% of the time. Harlee states he is walking for 1 mile 3 times per week.    Today's visit was #: 44 Starting weight: 406 lbs Starting date: 03/08/2017 Today's weight: 355 lbs Today's date: 04/28/2020 Total lbs lost to date: 51 Total lbs lost since last in-office visit: 9  Interim History: Denny Peon did well with weight loss. He is back on his medications after being off of them for a few months. He is following the plan very well. He denies excessive hunger.  Subjective:   1. Vitamin D deficiency Laquinton is on Vit D, and he denies nausea, vomiting, or muscle weakness.  2. Pre-diabetes Kai is on Rybelsus, and he denies nausea, vomiting, or diarrhea, or polyphagia.  3. Other hyperlipidemia Robben is not on medications, and he denies chest pain.  4. At risk for heart disease Cordale is at a higher than average risk for cardiovascular disease due to obesity.   Assessment/Plan:   1. Vitamin D deficiency Low Vitamin D level contributes to fatigue and are associated with obesity, breast, and colon cancer. We will check labs today, and we will refill prescription Vitamin D for 1 month. Purcell will follow-up for routine testing of Vitamin D, at least 2-3 times per year to avoid over-replacement.  - Vitamin D, Ergocalciferol, (DRISDOL) 1.25 MG (50000 UNIT) CAPS capsule; Take 1 capsule (50,000 Units total) by mouth every 7 (seven) days.  Dispense: 4 capsule; Refill: 1 - VITAMIN D 25 Hydroxy (Vit-D Deficiency, Fractures)  2. Pre-diabetes Oluwafemi will continue to work on weight loss, exercise, and decreasing simple carbohydrates to help decrease the risk of diabetes. We will check labs today, and we will refill Rybelsus for 1 month.  - Semaglutide (RYBELSUS)  14 MG TABS; Take 14 mg by mouth daily.  Dispense: 30 tablet; Refill: 0 - Comprehensive metabolic panel - Hemoglobin A1c - Insulin, random  3. Other hyperlipidemia Cardiovascular risk and specific lipid/LDL goals reviewed. We discussed several lifestyle modifications today. We will check labs today. Kodi will continue to work on diet, exercise and weight loss efforts. Orders and follow up as documented in patient record.   - Lipid panel  4. At risk for heart disease Josuel was given approximately 15 minutes of coronary artery disease prevention counseling today. He is 45 y.o. male and has risk factors for heart disease including obesity. We discussed intensive lifestyle modifications today with an emphasis on specific weight loss instructions and strategies.   Repetitive spaced learning was employed today to elicit superior memory formation and behavioral change.  5. Class 3 severe obesity with serious comorbidity and body mass index (BMI) of 45.0 to 49.9 in adult, unspecified obesity type (HCC) Vinny is currently in the action stage of change. As such, his goal is to continue with weight loss efforts. He has agreed to the Category 4 Plan.   Exercise goals: As is.  Behavioral modification strategies: meal planning and cooking strategies and planning for success.  Tanay has agreed to follow-up with our clinic in 3 weeks. He was informed of the importance of frequent follow-up visits to maximize his success with intensive lifestyle modifications for his multiple health conditions.   Needham was informed we would discuss his lab results at his next visit  unless there is a critical issue that needs to be addressed sooner. Ankit agreed to keep his next visit at the agreed upon time to discuss these results.  Objective:   Blood pressure 123/84, pulse 65, temperature 98.1 F (36.7 C), height 6' (1.829 m), weight (!) 355 lb (161 kg), SpO2 97 %. Body mass index is 48.15 kg/m.  General:  Cooperative, alert, well developed, in no acute distress. HEENT: Conjunctivae and lids unremarkable. Cardiovascular: Regular rhythm.  Lungs: Normal work of breathing. Neurologic: No focal deficits.   Lab Results  Component Value Date   CREATININE 1.17 04/28/2020   BUN 11 04/28/2020   NA 143 04/28/2020   K 4.3 04/28/2020   CL 100 04/28/2020   CO2 25 04/28/2020   Lab Results  Component Value Date   ALT 10 04/28/2020   AST 13 04/28/2020   ALKPHOS 71 04/28/2020   BILITOT 0.5 04/28/2020   Lab Results  Component Value Date   HGBA1C 5.5 04/28/2020   HGBA1C 5.3 10/22/2019   HGBA1C 5.4 05/15/2019   HGBA1C 5.5 02/05/2019   HGBA1C 5.6 07/17/2018   Lab Results  Component Value Date   INSULIN 4.2 04/28/2020   INSULIN 10.4 10/22/2019   INSULIN 9.1 05/15/2019   INSULIN 6.5 02/05/2019   INSULIN 11.7 07/17/2018   Lab Results  Component Value Date   TSH 1.260 03/08/2017   Lab Results  Component Value Date   CHOL 146 04/28/2020   HDL 45 04/28/2020   LDLCALC 90 04/28/2020   TRIG 53 04/28/2020   CHOLHDL 3.2 04/28/2020   Lab Results  Component Value Date   WBC 8.6 03/08/2017   HGB 13.2 03/08/2017   HCT 41.5 03/08/2017   MCV 90 03/08/2017   PLT 285 03/08/2017   No results found for: IRON, TIBC, FERRITIN  Attestation Statements:   Reviewed by clinician on day of visit: allergies, medications, problem list, medical history, surgical history, family history, social history, and previous encounter notes.   Trude Mcburney, am acting as transcriptionist for [employer], PA-C.  I have reviewed the above documentation for accuracy and completeness, and I agree with the above. Alois Cliche, PA-C

## 2020-05-17 ENCOUNTER — Other Ambulatory Visit (INDEPENDENT_AMBULATORY_CARE_PROVIDER_SITE_OTHER): Payer: Self-pay | Admitting: Physician Assistant

## 2020-05-17 DIAGNOSIS — R7303 Prediabetes: Secondary | ICD-10-CM

## 2020-05-18 NOTE — Telephone Encounter (Signed)
Pt last seen by [name], PA-C.  

## 2020-05-19 ENCOUNTER — Other Ambulatory Visit: Payer: Self-pay

## 2020-05-19 ENCOUNTER — Ambulatory Visit (INDEPENDENT_AMBULATORY_CARE_PROVIDER_SITE_OTHER): Payer: PRIVATE HEALTH INSURANCE | Admitting: Physician Assistant

## 2020-05-19 ENCOUNTER — Encounter (INDEPENDENT_AMBULATORY_CARE_PROVIDER_SITE_OTHER): Payer: Self-pay | Admitting: Physician Assistant

## 2020-05-19 VITALS — BP 129/84 | HR 71 | Temp 98.6°F | Ht 72.0 in | Wt 361.0 lb

## 2020-05-19 DIAGNOSIS — R7303 Prediabetes: Secondary | ICD-10-CM | POA: Diagnosis not present

## 2020-05-19 DIAGNOSIS — Z6841 Body Mass Index (BMI) 40.0 and over, adult: Secondary | ICD-10-CM

## 2020-05-19 DIAGNOSIS — Z9189 Other specified personal risk factors, not elsewhere classified: Secondary | ICD-10-CM

## 2020-05-19 DIAGNOSIS — E559 Vitamin D deficiency, unspecified: Secondary | ICD-10-CM | POA: Diagnosis not present

## 2020-05-19 NOTE — Progress Notes (Signed)
Chief Complaint:   OBESITY Louis [name] is here to discuss his progress with his obesity treatment plan along with follow-up of his obesity related diagnoses. [name] is on the Category 4 Plan and states he is following his eating plan approximately 85% of the time. [name] states he is walking 1.5 miles 3-4 times per week.  Today's visit was #: 45 Starting weight: 406 lbs Starting date: 03/08/2017 Today's weight: 361 lbs Today's date: 05/19/2020 Total lbs lost to date: 45 Total lbs lost since last in-office visit: 0  Interim History: Louis [name]'s grandmother passed away recently and he states that he has "been all over the place". He is also having some job stress. There were some days he didn't feel like eating and some days he ate what he wanted.  Subjective:   1. Vitamin D deficiency Louis [name]'s last Vit D level was not at goal. He was off Vit D for a few months earlier in the year. I discussed labs with the patient today.  2. Pre-diabetes Louis [name]'s last A1c was 5.5, worsening from 5.3. he is ready to restart the plan and increase exercise. I discussed labs with the patient today.  3. At risk for diabetes mellitus Louis [name] is at higher than average risk for developing diabetes due to obesity.   Assessment/Plan:   1. Vitamin D deficiency Low Vitamin D level contributes to fatigue and are associated with obesity, breast, and colon cancer. Louis [name] will continue taking prescription Vitamin D 50,000 IU every week as prescribed. He will follow-up for routine testing of Vitamin D, at least 2-3 times per year to avoid over-replacement.  2. Pre-diabetes Louis [name] will continue his meal plan, and will increase exercise and decrease simple carbohydrates to help decrease the risk of diabetes.   3. At risk for diabetes mellitus Louis [name] was given approximately 15 minutes of diabetes education and counseling today. We discussed intensive lifestyle modifications today with an emphasis on weight loss as well as increasing  exercise and decreasing simple carbohydrates in his diet. We also reviewed medication options with an emphasis on risk versus benefit of those discussed.   Repetitive spaced learning was employed today to elicit superior memory formation and behavioral change.  4. Class 3 severe obesity with serious comorbidity and body mass index (BMI) of 45.0 to 49.9 in adult, unspecified obesity type (HCC) Louis [name] is currently in the action stage of change. As such, his goal is to continue with weight loss efforts. He has agreed to the Category 4 Plan.   Exercise goals: As is.  Behavioral modification strategies: increasing lean protein intake and no skipping meals.  Louis [name] has agreed to follow-up with our clinic in 3 weeks. He was informed of the importance of frequent follow-up visits to maximize his success with intensive lifestyle modifications for his multiple health conditions.   Objective:   Blood pressure 129/84, pulse 71, temperature 98.6 F (37 C), temperature source Oral, height 6' (1.829 m), weight (!) 361 lb (163.7 kg), SpO2 97 %. Body mass index is 48.96 kg/m.  General: Cooperative, alert, well developed, in no acute distress. HEENT: Conjunctivae and lids unremarkable. Cardiovascular: Regular rhythm.  Lungs: Normal work of breathing. Neurologic: No focal deficits.   Lab Results  Component Value Date   CREATININE 1.17 04/28/2020   BUN 11 04/28/2020   NA 143 04/28/2020   K 4.3 04/28/2020   CL 100 04/28/2020   CO2 25 04/28/2020   Lab Results  Component Value Date   ALT 10 04/28/2020  AST 13 04/28/2020   ALKPHOS 71 04/28/2020   BILITOT 0.5 04/28/2020   Lab Results  Component Value Date   HGBA1C 5.5 04/28/2020   HGBA1C 5.3 10/22/2019   HGBA1C 5.4 05/15/2019   HGBA1C 5.5 02/05/2019   HGBA1C 5.6 07/17/2018   Lab Results  Component Value Date   INSULIN 4.2 04/28/2020   INSULIN 10.4 10/22/2019   INSULIN 9.1 05/15/2019   INSULIN 6.5 02/05/2019   INSULIN 11.7 07/17/2018    Lab Results  Component Value Date   TSH 1.260 03/08/2017   Lab Results  Component Value Date   CHOL 146 04/28/2020   HDL 45 04/28/2020   LDLCALC 90 04/28/2020   TRIG 53 04/28/2020   CHOLHDL 3.2 04/28/2020   Lab Results  Component Value Date   WBC 8.6 03/08/2017   HGB 13.2 03/08/2017   HCT 41.5 03/08/2017   MCV 90 03/08/2017   PLT 285 03/08/2017   No results found for: IRON, TIBC, FERRITIN  Attestation Statements:   Reviewed by clinician on day of visit: allergies, medications, problem list, medical history, surgical history, family history, social history, and previous encounter notes.   Trude Mcburney, am acting as transcriptionist for [employer], PA-C.  I have reviewed the above documentation for accuracy and completeness, and I agree with the above. Alois Cliche, PA-C

## 2020-06-10 ENCOUNTER — Ambulatory Visit (INDEPENDENT_AMBULATORY_CARE_PROVIDER_SITE_OTHER): Payer: PRIVATE HEALTH INSURANCE | Admitting: Physician Assistant

## 2020-07-01 ENCOUNTER — Other Ambulatory Visit: Payer: Self-pay

## 2020-07-01 ENCOUNTER — Other Ambulatory Visit (INDEPENDENT_AMBULATORY_CARE_PROVIDER_SITE_OTHER): Payer: Self-pay | Admitting: Physician Assistant

## 2020-07-01 ENCOUNTER — Encounter (INDEPENDENT_AMBULATORY_CARE_PROVIDER_SITE_OTHER): Payer: Self-pay | Admitting: Physician Assistant

## 2020-07-01 ENCOUNTER — Ambulatory Visit (INDEPENDENT_AMBULATORY_CARE_PROVIDER_SITE_OTHER): Payer: PRIVATE HEALTH INSURANCE | Admitting: Physician Assistant

## 2020-07-01 VITALS — BP 144/92 | HR 66 | Temp 98.2°F | Ht 72.0 in | Wt 357.0 lb

## 2020-07-01 DIAGNOSIS — R7303 Prediabetes: Secondary | ICD-10-CM | POA: Diagnosis not present

## 2020-07-01 DIAGNOSIS — Z9189 Other specified personal risk factors, not elsewhere classified: Secondary | ICD-10-CM

## 2020-07-01 DIAGNOSIS — E66813 Obesity, class 3: Secondary | ICD-10-CM

## 2020-07-01 DIAGNOSIS — Z6841 Body Mass Index (BMI) 40.0 and over, adult: Secondary | ICD-10-CM | POA: Diagnosis not present

## 2020-07-01 DIAGNOSIS — I10 Essential (primary) hypertension: Secondary | ICD-10-CM | POA: Diagnosis not present

## 2020-07-01 MED ORDER — OZEMPIC (0.25 OR 0.5 MG/DOSE) 2 MG/1.5ML ~~LOC~~ SOPN: 0.2500 mg | PEN_INJECTOR | SUBCUTANEOUS | 0 refills | Status: DC

## 2020-07-08 ENCOUNTER — Encounter (INDEPENDENT_AMBULATORY_CARE_PROVIDER_SITE_OTHER): Payer: Self-pay

## 2020-07-08 ENCOUNTER — Other Ambulatory Visit (INDEPENDENT_AMBULATORY_CARE_PROVIDER_SITE_OTHER): Payer: Self-pay | Admitting: Physician Assistant

## 2020-07-08 MED ORDER — TRULICITY 0.75 MG/0.5ML ~~LOC~~ SOAJ: 0.7500 mg | SUBCUTANEOUS | 0 refills | Status: DC

## 2020-07-08 NOTE — Progress Notes (Signed)
Chief Complaint:   OBESITY Louis [name] is here to discuss his progress with his obesity treatment plan along with follow-up of his obesity related diagnoses. Louis [name] is on the Category 4 Plan and states he is following his eating plan approximately 90% of the time. Louis [name] states he is walking 1 mile 3-4 times per week.   Today's visit was #: 46 Starting weight: 406 lbs Starting date: 03/08/2017 Today's weight: 357 lbs Today's date: 07/01/2020 Total lbs lost to date: 9 Total lbs lost since last in-office visit: 4  Interim History: Louis [name] had some recent deaths in the family, which affected his ability to remain on the plan. He is back on the plan now and he is motivated to continue.  Subjective:   1. Pre-diabetes Louis [name]'s Rybelsus is completely depleting his appetite. Last A1c was 5.5.  2. Essential hypertension Jyren's blood pressure is slightly elevated today due to stress. He denies chest pain or headache. He is on losartan.  3. At risk for heart disease Laney is at a higher than average risk for cardiovascular disease due to obesity.   Assessment/Plan:   1. Pre-diabetes Hope agreed to discontinue Rybelsus, and start Ozempic 0.25 mg with no refills. He will continue to work on weight loss, exercise, and decreasing simple carbohydrates to help decrease the risk of diabetes.   - Semaglutide,0.25 or 0.5MG /DOS, (OZEMPIC, 0.25 OR 0.5 MG/DOSE,) 2 MG/1.5ML SOPN; Inject 0.25 mg into the skin once a week.  Dispense: 1.5 mL; Refill: 0  2. Essential hypertension Louis [name] will continue losartan, and will continue his meal plan, healthy weight loss, and exercise to improve blood pressure control. We will watch for signs of hypotension as he continues his lifestyle modifications.  3. At risk for heart disease Louis [name] was given approximately 15 minutes of coronary artery disease prevention counseling today. He is 45 y.o. male and has risk factors for heart disease including obesity. We discussed  intensive lifestyle modifications today with an emphasis on specific weight loss instructions and strategies.   Repetitive spaced learning was employed today to elicit superior memory formation and behavioral change.  4. Class 3 severe obesity with serious comorbidity and body mass index (BMI) of 45.0 to 49.9 in adult, unspecified obesity type (HCC) Louis [name] is currently in the action stage of change. As such, his goal is to continue with weight loss efforts. He has agreed to the Category 4 Plan.   Exercise goals: As is.  Behavioral modification strategies: increasing lean protein intake, decreasing simple carbohydrates, and no skipping meals.  Louis [name] has agreed to follow-up with our clinic in 3 weeks. He was informed of the importance of frequent follow-up visits to maximize his success with intensive lifestyle modifications for his multiple health conditions.   Objective:   Blood pressure (!) 144/92, pulse 66, temperature 98.2 F (36.8 C), height 6' (1.829 m), weight (!) 357 lb (161.9 kg), SpO2 97 %. Body mass index is 48.42 kg/m.  General: Cooperative, alert, well developed, in no acute distress. HEENT: Conjunctivae and lids unremarkable. Cardiovascular: Regular rhythm.  Lungs: Normal work of breathing. Neurologic: No focal deficits.   Lab Results  Component Value Date   CREATININE 1.17 04/28/2020   BUN 11 04/28/2020   NA 143 04/28/2020   K 4.3 04/28/2020   CL 100 04/28/2020   CO2 25 04/28/2020   Lab Results  Component Value Date   ALT 10 04/28/2020   AST 13 04/28/2020   ALKPHOS 71 04/28/2020   BILITOT 0.5 04/28/2020  Lab Results  Component Value Date   HGBA1C 5.5 04/28/2020   HGBA1C 5.3 10/22/2019   HGBA1C 5.4 05/15/2019   HGBA1C 5.5 02/05/2019   HGBA1C 5.6 07/17/2018   Lab Results  Component Value Date   INSULIN 4.2 04/28/2020   INSULIN 10.4 10/22/2019   INSULIN 9.1 05/15/2019   INSULIN 6.5 02/05/2019   INSULIN 11.7 07/17/2018   Lab Results  Component  Value Date   TSH 1.260 03/08/2017   Lab Results  Component Value Date   CHOL 146 04/28/2020   HDL 45 04/28/2020   LDLCALC 90 04/28/2020   TRIG 53 04/28/2020   CHOLHDL 3.2 04/28/2020   Lab Results  Component Value Date   VD25OH 37.1 04/28/2020   VD25OH 43.1 10/22/2019   VD25OH 48.4 05/15/2019   Lab Results  Component Value Date   WBC 8.6 03/08/2017   HGB 13.2 03/08/2017   HCT 41.5 03/08/2017   MCV 90 03/08/2017   PLT 285 03/08/2017   No results found for: IRON, TIBC, FERRITIN  Attestation Statements:   Reviewed by clinician on day of visit: allergies, medications, problem list, medical history, surgical history, family history, social history, and previous encounter notes.   Trude Mcburney, am acting as transcriptionist for [employer], PA-C.  I have reviewed the above documentation for accuracy and completeness, and I agree with the above. Alois Cliche, PA-C

## 2020-07-12 NOTE — Progress Notes (Signed)
Appointment made

## 2020-07-12 NOTE — Telephone Encounter (Signed)
Last seen Louis [name] 

## 2020-07-14 ENCOUNTER — Other Ambulatory Visit: Payer: Self-pay

## 2020-07-14 ENCOUNTER — Ambulatory Visit (INDEPENDENT_AMBULATORY_CARE_PROVIDER_SITE_OTHER): Payer: 59 | Admitting: Medical

## 2020-07-14 ENCOUNTER — Other Ambulatory Visit (HOSPITAL_COMMUNITY)
Admission: RE | Admit: 2020-07-14 | Discharge: 2020-07-14 | Disposition: A | Discharge status: Home / Self Care | Payer: 59 | Source: Ambulatory Visit | Attending: Medical | Admitting: Medical

## 2020-07-14 VITALS — BP 150/85 | HR 63 | Ht 72.0 in | Wt 364.0 lb

## 2020-07-14 DIAGNOSIS — Z6841 Body Mass Index (BMI) 40.0 and over, adult: Secondary | ICD-10-CM

## 2020-07-14 DIAGNOSIS — Z113 Encounter for screening for infections with a predominantly sexual mode of transmission: Secondary | ICD-10-CM

## 2020-07-14 DIAGNOSIS — I10 Essential (primary) hypertension: Secondary | ICD-10-CM

## 2020-07-14 DIAGNOSIS — Z1211 Encounter for screening for malignant neoplasm of colon: Secondary | ICD-10-CM

## 2020-07-14 DIAGNOSIS — R7303 Prediabetes: Secondary | ICD-10-CM | POA: Diagnosis not present

## 2020-07-14 NOTE — Patient Instructions (Addendum)
For std screening will get urine ancillary testing, hiv and rpr testing.  For prediabetes continue current meds rx'd by healthy weight loss management.  For htn bp high today but did not take bp med today. Please take med when you get home then send me my chart note with bp reading.  Continue with weight loss management for obesity.  Refer for screening colon cancer.  Follow up 6-12 months. I do get weight loss clinic notes but do want to see you at least once a year. Sooner if needed.

## 2020-07-14 NOTE — Progress Notes (Signed)
Subjective:    Patient ID: Louis [name], male    DOB: [birth year], 45 y.o.   MRN: [id number]  HPI  Pt in for follow up. Have not seen him in a while. He has been going to healthy weight loss. Pt lost weight from 418 lb down to 364 lbs. Lower at times then gained back some. When he started separation lost down to 339 lb.  Pt updates me that he is going thru divorce. He plans after divorce to move back to [place].    Pt tells me wife was unfaithful. He reconciled for short time but then happened again. Pt has not been tested for STD's. Pt has no signs or symptoms of std. Eventually after divorce he wants to restart relationships.  Pt has htn. Pt did not check his bp medication today. On losartan 100 mg daily.  Hx of prediabetes. Weight loss management is in process of trying to put him on trulicity. But presenlty on rebelsus.  Pt does smoke socially. 1-2 cigarrets a month.   Pt never had colonscopy.   Review of Systems  Constitutional:  Negative for chills, fatigue and fever.  HENT:  Negative for congestion, drooling and ear pain.   Respiratory:  Negative for cough, chest tightness, shortness of breath and wheezing.   Cardiovascular:  Negative for chest pain and palpitations.  Gastrointestinal:  Negative for abdominal pain.  Endocrine: Negative for polydipsia, polyphagia and polyuria.  Genitourinary:  Negative for dysuria, flank pain and frequency.  Musculoskeletal:  Negative for arthralgias, back pain and gait problem.  Skin:  Negative for rash.    Past Medical History:  Diagnosis Date   Allergy    Arthritis    in both knees   GERD (gastroesophageal reflux disease)    Headache    HTN (hypertension)    Obesity      Social History   Socioeconomic History   Marital status: Divorced    Spouse name: Shayaan Parke   Number of children: 7   Years of education: Not on file   Highest education level: Not on file  Occupational History   Occupation: Corporate investment banker   Tobacco Use   Smoking status: Former    Packs/day: 0.25    Years: 3.00    Pack years: 0.75    Types: Cigarettes    Quit date: 06/14/2016    Years since quitting: 4.0   Smokeless tobacco: Never  Vaping Use   Vaping Use: Never used  Substance and Sexual Activity   Alcohol use: Yes    Alcohol/week: 3.0 standard drinks    Types: 3 Cans of beer per week    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   Drug use: No   Sexual activity: Yes  Other Topics Concern   Not on file  Social History Narrative   Not on file   Social Determinants of Health   Financial Resource Strain: Not on file  Food Insecurity: Not on file  Transportation Needs: Not on file  Physical Activity: Not on file  Stress: Not on file  Social Connections: Not on file  Intimate Partner Violence: Not on file    Past Surgical History:  Procedure Laterality Date   ACHILLES TENDON REPAIR     ANTERIOR CRUCIATE LIGAMENT REPAIR     LUMBAR LAMINECTOMY/DECOMPRESSION MICRODISCECTOMY Right 09/19/2016   Procedure: Right Lumbar Five-Sacral one Laminotomy/Foraminotomy/removal of Synovial cyst;  Surgeon: [name], MD;  Location: [facility] OR;  Service: Neurosurgery;  Laterality: Right;  Right L5-S1 Laminotomy/Foraminotomy/removal of Synovial cyst   POSTERIOR CERVICAL LAMINECTOMY N/A 06/12/2016   Procedure: Cervical One Posterior cervical laminectomy;  Surgeon: [name], MD;  Location: [facility] OR;  Service: Neurosurgery;  Laterality: N/A;  posterior   TONSILLECTOMY      Family History  Problem Relation Age of Onset   Diabetes Father    Obesity Father     Allergies  Allergen Reactions   No Known Allergies     Current Outpatient Medications on File Prior to Visit  Medication Sig Dispense Refill   acetaminophen (TYLENOL) 500 MG tablet Take 1,000 mg by mouth every 6 (six) hours as needed (for pain.).     diclofenac (VOLTAREN) 75 MG EC tablet TAKE 1 TABLET BY MOUTH TWICE A DAY 60 tablet 0   Dulaglutide (TRULICITY) 0.75  MG/0.5ML SOPN Inject 0.75 mg into the skin once a week. 2 mL 0   losartan (COZAAR) 100 MG tablet Take 1 tablet (100 mg total) by mouth daily. 30 tablet 0   Vitamin D, Ergocalciferol, (DRISDOL) 1.25 MG (50000 UNIT) CAPS capsule Take 1 capsule (50,000 Units total) by mouth every 7 (seven) days. 4 capsule 1   No current facility-administered medications on file prior to visit.    BP (!) 162/90   Pulse 63   Ht 6' (1.829 m)   Wt (!) 364 lb (165.1 kg)   SpO2 96%   BMI 49.37 kg/m       Objective:   Physical Exam  General Mental Status- Alert. General Appearance- Not in acute distress.   Skin General: Color- Normal Color. Moisture- Normal Moisture.  Neck Carotid Arteries- Normal color. Moisture- Normal Moisture. No carotid bruits. No JVD.  Chest and Lung Exam Auscultation: Breath Sounds:-Normal.  Cardiovascular Auscultation:Rythm- Regular. Murmurs & Other Heart Sounds:Auscultation of the heart reveals- No Murmurs.  Abdomen Inspection:-Inspeection Normal. Palpation/Percussion:Note:No mass. Palpation and Percussion of the abdomen reveal- Non Tender, Non Distended + BS, no rebound or guarding.   Neurologic Cranial Nerve exam:- CN III-XII intact(No nystagmus), symmetric smile. Strength:- 5/5 equal and symmetric strength both upper and lower extremities.       Assessment & Plan:   For std screening will get urine ancillary testing, hiv and rpr testing.  For prediabetes continue current meds rx'd by healthy weight loss management.  For htn bp high today but did not take bp med today. Please take med when you get home then send me my chart note with bp reading.  Continue with weight loss management for obesity.  Refer for screening colon cancer.  Follow up 6-12 months. I do get weight loss clinic notes but do want to see you at least once a year. Sooner if needed.  Esperanza Richters, PA-C

## 2020-07-15 LAB — URINE CYTOLOGY ANCILLARY ONLY
Chlamydia: NEGATIVE
Comment: NEGATIVE
Comment: NEGATIVE
Comment: NORMAL
Neisseria Gonorrhea: NEGATIVE
Trichomonas: NEGATIVE

## 2020-07-15 LAB — RPR: RPR Ser Ql: NONREACTIVE

## 2020-07-15 LAB — HIV ANTIBODY (ROUTINE TESTING W REFLEX): HIV 1&2 Ab, 4th Generation: NONREACTIVE

## 2020-07-22 ENCOUNTER — Ambulatory Visit (INDEPENDENT_AMBULATORY_CARE_PROVIDER_SITE_OTHER): Payer: PRIVATE HEALTH INSURANCE | Admitting: Physician Assistant

## 2020-07-22 ENCOUNTER — Encounter (INDEPENDENT_AMBULATORY_CARE_PROVIDER_SITE_OTHER): Payer: Self-pay | Admitting: Physician Assistant

## 2020-07-22 ENCOUNTER — Other Ambulatory Visit: Payer: Self-pay

## 2020-07-22 VITALS — BP 126/85 | HR 63 | Temp 98.3°F | Ht 70.0 in | Wt 357.0 lb

## 2020-07-22 DIAGNOSIS — R7301 Impaired fasting glucose: Secondary | ICD-10-CM

## 2020-07-22 DIAGNOSIS — Z9189 Other specified personal risk factors, not elsewhere classified: Secondary | ICD-10-CM

## 2020-07-22 DIAGNOSIS — I10 Essential (primary) hypertension: Secondary | ICD-10-CM

## 2020-07-22 DIAGNOSIS — E559 Vitamin D deficiency, unspecified: Secondary | ICD-10-CM | POA: Diagnosis not present

## 2020-07-22 DIAGNOSIS — Z6841 Body Mass Index (BMI) 40.0 and over, adult: Secondary | ICD-10-CM

## 2020-07-22 MED ORDER — VITAMIN D (ERGOCALCIFEROL) 1.25 MG (50000 UNIT) PO CAPS: 50000.0000 [IU] | ORAL_CAPSULE | ORAL | 0 refills | Status: DC

## 2020-07-22 MED ORDER — OZEMPIC (0.25 OR 0.5 MG/DOSE) 2 MG/1.5ML ~~LOC~~ SOPN: 0.2500 mg | PEN_INJECTOR | SUBCUTANEOUS | 0 refills | Status: DC

## 2020-07-22 MED ORDER — LOSARTAN POTASSIUM 100 MG PO TABS: 100.0000 mg | ORAL_TABLET | Freq: Every day | ORAL | 0 refills | Status: DC

## 2020-07-28 NOTE — Progress Notes (Signed)
Chief Complaint:   OBESITY Louis [name] is here to discuss his progress with his obesity treatment plan along with follow-up of his obesity related diagnoses. Louis [name] is on the Category 4 Plan and states he is following his eating plan approximately 95% of the time. Louis [name] states he is walking 1 mile 4 times per week.   Today's visit was #: 47 Starting weight: 406 lbs Starting date: 03/08/2017 Today's weight: 357 lbs Today's date: 07/22/2020 Total lbs lost to date: 9 Total lbs lost since last in-office visit: 0  Interim History: Louis [name] did not start Trulicity as he states that his insurance  didn't approve it. He notes he has been hungry and he is eating all of the food on the plan. He is not weighing his protein  Subjective:   1. Vitamin D deficiency Louis [name] is on Vit D, and he is tolerating it well.  2. Essential hypertension Louis [name]'s blood pressure is controlled, and he denies chest pain. He is on Cozaar.  3. Impaired fasting glucose Louis [name] reports polyphagia.  4. At risk for side effect of medication Louis [name] is at risk for drug side effects due to starting GLP-1.  Assessment/Plan:   1. Vitamin D deficiency Low Vitamin D level contributes to fatigue and are associated with obesity, breast, and colon cancer. We will refill prescription Vitamin D for 1 month. Louis [name] will follow-up for routine testing of Vitamin D, at least 2-3 times per year to avoid over-replacement.  - Vitamin D, Ergocalciferol, (DRISDOL) 1.25 MG (50000 UNIT) CAPS capsule; Take 1 capsule (50,000 Units total) by mouth every 7 (seven) days.  Dispense: 4 capsule; Refill: 0  2. Essential hypertension Louis [name] will continue working on healthy weight loss and exercise to improve blood pressure control. We will watch for signs of hypotension as he continues his lifestyle modifications. We will refill Cozaar for 1 month.  - losartan (COZAAR) 100 MG tablet; Take 1 tablet (100 mg total) by mouth daily.  Dispense: 30 tablet; Refill:  0  3. Impaired fasting glucose Louis [name] agreed to start Ozempic 0.25 mg weekly with no refills.   - Semaglutide,0.25 or 0.5MG /DOS, (OZEMPIC, 0.25 OR 0.5 MG/DOSE,) 2 MG/1.5ML SOPN; Inject 0.25 mg into the skin once a week.  Dispense: 1.5 mL; Refill: 0  4. At risk for side effect of medication Louis [name] was given approximately 15 minutes of drug side effect counseling today.  We discussed side effect possibility and risk versus benefits. Louis [name] agreed to the medication and will contact this office if these side effects are intolerable.  Repetitive spaced learning was employed today to elicit superior memory formation and behavioral change.  5. Class 3 severe obesity with serious comorbidity and body mass index (BMI) of 45.0 to 49.9 in adult, unspecified obesity type (HCC), current bmi 51.22 Louis [name] is currently in the action stage of change. As such, his goal is to continue with weight loss efforts. He has agreed to the Category 4 Plan.   Exercise goals: As is.  Behavioral modification strategies: meal planning and cooking strategies and planning for success.  Louis [name] has agreed to follow-up with our clinic in 4 weeks. He was informed of the importance of frequent follow-up visits to maximize his success with intensive lifestyle modifications for his multiple health conditions.   Objective:   Blood pressure 126/85, pulse 63, temperature 98.3 F (36.8 C), height 5\' 10"  (1.778 m), weight (!) 357 lb (161.9 kg), SpO2 98 %. Body mass index is 51.22 kg/m.  General: Cooperative, alert, well  developed, in no acute distress. HEENT: Conjunctivae and lids unremarkable. Cardiovascular: Regular rhythm.  Lungs: Normal work of breathing. Neurologic: No focal deficits.   Lab Results  Component Value Date   CREATININE 1.17 04/28/2020   BUN 11 04/28/2020   NA 143 04/28/2020   K 4.3 04/28/2020   CL 100 04/28/2020   CO2 25 04/28/2020   Lab Results  Component Value Date   ALT 10 04/28/2020   AST 13  04/28/2020   ALKPHOS 71 04/28/2020   BILITOT 0.5 04/28/2020   Lab Results  Component Value Date   HGBA1C 5.5 04/28/2020   HGBA1C 5.3 10/22/2019   HGBA1C 5.4 05/15/2019   HGBA1C 5.5 02/05/2019   HGBA1C 5.6 07/17/2018   Lab Results  Component Value Date   INSULIN 4.2 04/28/2020   INSULIN 10.4 10/22/2019   INSULIN 9.1 05/15/2019   INSULIN 6.5 02/05/2019   INSULIN 11.7 07/17/2018   Lab Results  Component Value Date   TSH 1.260 03/08/2017   Lab Results  Component Value Date   CHOL 146 04/28/2020   HDL 45 04/28/2020   LDLCALC 90 04/28/2020   TRIG 53 04/28/2020   CHOLHDL 3.2 04/28/2020   Lab Results  Component Value Date   VD25OH 37.1 04/28/2020   VD25OH 43.1 10/22/2019   VD25OH 48.4 05/15/2019   Lab Results  Component Value Date   WBC 8.6 03/08/2017   HGB 13.2 03/08/2017   HCT 41.5 03/08/2017   MCV 90 03/08/2017   PLT 285 03/08/2017   No results found for: IRON, TIBC, FERRITIN  Attestation Statements:   Reviewed by clinician on day of visit: allergies, medications, problem list, medical history, surgical history, family history, social history, and previous encounter notes.   Trude Mcburney, am acting as transcriptionist for [employer], PA-C.  I have reviewed the above documentation for accuracy and completeness, and I agree with the above. Alois Cliche, PA-C

## 2020-08-02 ENCOUNTER — Encounter (INDEPENDENT_AMBULATORY_CARE_PROVIDER_SITE_OTHER): Payer: Self-pay

## 2020-08-17 ENCOUNTER — Encounter (INDEPENDENT_AMBULATORY_CARE_PROVIDER_SITE_OTHER): Payer: Self-pay

## 2020-08-19 ENCOUNTER — Encounter (INDEPENDENT_AMBULATORY_CARE_PROVIDER_SITE_OTHER): Payer: Self-pay | Admitting: Family Medicine

## 2020-08-19 ENCOUNTER — Other Ambulatory Visit: Payer: Self-pay

## 2020-08-19 ENCOUNTER — Ambulatory Visit (INDEPENDENT_AMBULATORY_CARE_PROVIDER_SITE_OTHER): Payer: PRIVATE HEALTH INSURANCE | Admitting: Family Medicine

## 2020-08-19 VITALS — BP 125/83 | HR 60 | Temp 98.2°F | Ht 70.0 in | Wt 358.0 lb

## 2020-08-19 DIAGNOSIS — R7303 Prediabetes: Secondary | ICD-10-CM

## 2020-08-19 DIAGNOSIS — Z6841 Body Mass Index (BMI) 40.0 and over, adult: Secondary | ICD-10-CM

## 2020-08-19 DIAGNOSIS — Z9189 Other specified personal risk factors, not elsewhere classified: Secondary | ICD-10-CM | POA: Diagnosis not present

## 2020-08-19 DIAGNOSIS — E559 Vitamin D deficiency, unspecified: Secondary | ICD-10-CM | POA: Diagnosis not present

## 2020-08-19 DIAGNOSIS — I10 Essential (primary) hypertension: Secondary | ICD-10-CM | POA: Diagnosis not present

## 2020-08-19 DIAGNOSIS — R7301 Impaired fasting glucose: Secondary | ICD-10-CM

## 2020-08-19 MED ORDER — LOSARTAN POTASSIUM 100 MG PO TABS: 100.0000 mg | ORAL_TABLET | Freq: Every day | ORAL | 0 refills | Status: DC

## 2020-08-19 MED ORDER — VITAMIN D (ERGOCALCIFEROL) 1.25 MG (50000 UNIT) PO CAPS: 50000.0000 [IU] | ORAL_CAPSULE | ORAL | 0 refills | Status: DC

## 2020-08-23 ENCOUNTER — Other Ambulatory Visit (HOSPITAL_BASED_OUTPATIENT_CLINIC_OR_DEPARTMENT_OTHER): Payer: Self-pay

## 2020-08-23 MED ORDER — TIRZEPATIDE 2.5 MG/0.5ML ~~LOC~~ SOAJ
2.5000 mg | SUBCUTANEOUS | 0 refills | Status: DC
  Filled 2020-08-23: qty 2, 28d supply, fill #0

## 2020-08-23 NOTE — Progress Notes (Signed)
Chief Complaint:   OBESITY Louis [name] is here to discuss his progress with his obesity treatment plan along with follow-up of his obesity related diagnoses. Louis [name] is on the Category 4 Plan and states he is following his eating plan approximately 90% of the time. Louis [name] states he is walking 1-1.5 miles every other day, and walking for 10-15 minutes 3-4 times per week.  Today's visit was #: 48 Starting weight: 406 lbs Starting date: 03/08/2017 Today's weight: 358 lbs Today's date: 08/19/2020 Total lbs lost to date: 48 Total lbs lost since last in-office visit: 0  Interim History: Louis [name] has struggled a bit more with his plan. He has had extra challenges and temptations, and he deviated a bit more. He is ready to get back on track.  Subjective:   1. Essential hypertension Louis [name]'s blood pressure is stable on his medications, and he is working on diet. He denies chest pain.  2. Pre-diabetes Louis [name] is off of Rybelsus, and his insurance won't cover Ozempic. He is ok to look at alternatives.  3. Vitamin D deficiency Louis [name] is stable on Vit D, and he denies nausea, vomiting, or muscle weakness.  4. At risk for diabetes mellitus Louis [name] is at higher than average risk for developing diabetes due to obesity.   Assessment/Plan:   1. Essential hypertension Louis [name] is working on healthy weight loss and exercise to improve blood pressure control. We will refill losartan or 1 month, and will watch for signs of hypotension as he continues his lifestyle modifications.  - losartan (COZAAR) 100 MG tablet; Take 1 tablet (100 mg total) by mouth daily.  Dispense: 30 tablet; Refill: 0  2. Pre-diabetes Louis [name] agreed to discontinue Ozempic, and start Mounjaro 2.5 mg q weekly with no refills. He will continue to work on weight loss, exercise, and decreasing simple carbohydrates to help decrease the risk of diabetes.   - tirzepatide [facility]) 2.5 MG/0.5ML Pen; Inject 2.5 mg into the skin once a week.  Dispense: 2  mL; Refill: 0  3. Vitamin D deficiency Low Vitamin D level contributes to fatigue and are associated with obesity, breast, and colon cancer. We will refill prescription Vitamin D for 1 month. Louis [name] will follow-up for routine testing of Vitamin D, at least 2-3 times per year to avoid over-replacement.  - Vitamin D, Ergocalciferol, (DRISDOL) 1.25 MG (50000 UNIT) CAPS capsule; Take 1 capsule (50,000 Units total) by mouth every 7 (seven) days.  Dispense: 4 capsule; Refill: 0  4. At risk for diabetes mellitus Louis [name] was given approximately 15 minutes of diabetes education and counseling today. We discussed intensive lifestyle modifications today with an emphasis on weight loss as well as increasing exercise and decreasing simple carbohydrates in his diet. We also reviewed medication options with an emphasis on risk versus benefit of those discussed.   Repetitive spaced learning was employed today to elicit superior memory formation and behavioral change.  5. Obesity with current BMI 51.5 Louis [name] is currently in the action stage of change. As such, his goal is to continue with weight loss efforts. He has agreed to the Category 3 Plan or the Category 4 Plan.   Exercise goals: As is.  Behavioral modification strategies: increasing lean protein intake and meal planning and cooking strategies.  Louis [name] has agreed to follow-up with our clinic in 3 weeks. He was informed of the importance of frequent follow-up visits to maximize his success with intensive lifestyle modifications for his multiple health conditions.   Objective:   Blood pressure 125/83,  pulse 60, temperature 98.2 F (36.8 C), height 5\' 10"  (1.778 m), weight (!) 358 lb (162.4 kg), SpO2 96 %. Body mass index is 51.37 kg/m.  General: Cooperative, alert, well developed, in no acute distress. HEENT: Conjunctivae and lids unremarkable. Cardiovascular: Regular rhythm.  Lungs: Normal work of breathing. Neurologic: No focal deficits.   Lab  Results  Component Value Date   CREATININE 1.17 04/28/2020   BUN 11 04/28/2020   NA 143 04/28/2020   K 4.3 04/28/2020   CL 100 04/28/2020   CO2 25 04/28/2020   Lab Results  Component Value Date   ALT 10 04/28/2020   AST 13 04/28/2020   ALKPHOS 71 04/28/2020   BILITOT 0.5 04/28/2020   Lab Results  Component Value Date   HGBA1C 5.5 04/28/2020   HGBA1C 5.3 10/22/2019   HGBA1C 5.4 05/15/2019   HGBA1C 5.5 02/05/2019   HGBA1C 5.6 07/17/2018   Lab Results  Component Value Date   INSULIN 4.2 04/28/2020   INSULIN 10.4 10/22/2019   INSULIN 9.1 05/15/2019   INSULIN 6.5 02/05/2019   INSULIN 11.7 07/17/2018   Lab Results  Component Value Date   TSH 1.260 03/08/2017   Lab Results  Component Value Date   CHOL 146 04/28/2020   HDL 45 04/28/2020   LDLCALC 90 04/28/2020   TRIG 53 04/28/2020   CHOLHDL 3.2 04/28/2020   Lab Results  Component Value Date   VD25OH 37.1 04/28/2020   VD25OH 43.1 10/22/2019   VD25OH 48.4 05/15/2019   Lab Results  Component Value Date   WBC 8.6 03/08/2017   HGB 13.2 03/08/2017   HCT 41.5 03/08/2017   MCV 90 03/08/2017   PLT 285 03/08/2017   No results found for: IRON, TIBC, FERRITIN  Attestation Statements:   Reviewed by clinician on day of visit: allergies, medications, problem list, medical history, surgical history, family history, social history, and previous encounter notes.   I, 05/08/2017, am acting as transcriptionist for Burt Knack, MD.  I have reviewed the above documentation for accuracy and completeness, and I agree with the above. -  Quillian Quince, MD

## 2020-08-24 ENCOUNTER — Other Ambulatory Visit (HOSPITAL_BASED_OUTPATIENT_CLINIC_OR_DEPARTMENT_OTHER): Payer: Self-pay

## 2020-08-25 ENCOUNTER — Other Ambulatory Visit (INDEPENDENT_AMBULATORY_CARE_PROVIDER_SITE_OTHER): Payer: Self-pay | Admitting: Physician Assistant

## 2020-08-25 ENCOUNTER — Other Ambulatory Visit (HOSPITAL_BASED_OUTPATIENT_CLINIC_OR_DEPARTMENT_OTHER): Payer: Self-pay

## 2020-08-25 DIAGNOSIS — I10 Essential (primary) hypertension: Secondary | ICD-10-CM

## 2020-08-26 ENCOUNTER — Other Ambulatory Visit (HOSPITAL_BASED_OUTPATIENT_CLINIC_OR_DEPARTMENT_OTHER): Payer: Self-pay

## 2020-08-30 ENCOUNTER — Encounter (INDEPENDENT_AMBULATORY_CARE_PROVIDER_SITE_OTHER): Payer: Self-pay

## 2020-08-30 ENCOUNTER — Other Ambulatory Visit (HOSPITAL_BASED_OUTPATIENT_CLINIC_OR_DEPARTMENT_OTHER): Payer: Self-pay

## 2020-08-30 ENCOUNTER — Telehealth (INDEPENDENT_AMBULATORY_CARE_PROVIDER_SITE_OTHER): Payer: Self-pay

## 2020-08-30 NOTE — Telephone Encounter (Signed)
Pt called in and stated that his insurance will not approve the [facility], the pt stated that he would like to go back to his old prescription. Please advise

## 2020-08-30 NOTE — Telephone Encounter (Signed)
I called patient and he stated his ins would not cover the [facility]. I called his pharmacy [facility] and asked them if they applied the coupon for [facility]. They did not apply the coupon. Once they apply the coupon the med cost was only $25 for the patient.  I spoke with patient and he stated his is willing to [facility]. Greggory Keen will be processed and patient can pick up the [facility] tomorrow

## 2020-08-31 ENCOUNTER — Other Ambulatory Visit (HOSPITAL_BASED_OUTPATIENT_CLINIC_OR_DEPARTMENT_OTHER): Payer: Self-pay

## 2020-09-01 ENCOUNTER — Other Ambulatory Visit (HOSPITAL_BASED_OUTPATIENT_CLINIC_OR_DEPARTMENT_OTHER): Payer: Self-pay

## 2020-09-16 ENCOUNTER — Ambulatory Visit (INDEPENDENT_AMBULATORY_CARE_PROVIDER_SITE_OTHER): Payer: PRIVATE HEALTH INSURANCE | Admitting: Family Medicine

## 2020-09-16 ENCOUNTER — Other Ambulatory Visit: Payer: Self-pay

## 2020-09-16 ENCOUNTER — Other Ambulatory Visit (HOSPITAL_BASED_OUTPATIENT_CLINIC_OR_DEPARTMENT_OTHER): Payer: Self-pay

## 2020-09-16 ENCOUNTER — Encounter (INDEPENDENT_AMBULATORY_CARE_PROVIDER_SITE_OTHER): Payer: Self-pay | Admitting: Family Medicine

## 2020-09-16 VITALS — BP 115/76 | HR 61 | Temp 98.0°F | Ht 70.0 in | Wt 355.0 lb

## 2020-09-16 DIAGNOSIS — R7303 Prediabetes: Secondary | ICD-10-CM | POA: Diagnosis not present

## 2020-09-16 DIAGNOSIS — Z9189 Other specified personal risk factors, not elsewhere classified: Secondary | ICD-10-CM | POA: Diagnosis not present

## 2020-09-16 DIAGNOSIS — Z6841 Body Mass Index (BMI) 40.0 and over, adult: Secondary | ICD-10-CM

## 2020-09-16 DIAGNOSIS — E559 Vitamin D deficiency, unspecified: Secondary | ICD-10-CM | POA: Diagnosis not present

## 2020-09-16 MED ORDER — TIRZEPATIDE 2.5 MG/0.5ML ~~LOC~~ SOAJ
2.5000 mg | SUBCUTANEOUS | 0 refills | Status: DC
Start: 2020-09-16 — End: 2020-10-14
  Filled 2020-09-16 – 2020-10-01 (×3): qty 2, 28d supply, fill #0

## 2020-09-16 NOTE — Progress Notes (Signed)
Chief Complaint:   OBESITY Louis [name] is here to discuss his progress with his obesity treatment plan along with follow-up of his obesity related diagnoses. Louis [name] is on the Category 3 Plan or the Category 4 Plan and states he is following his eating plan approximately 80% of the time. Louis [name] states he is walking for 45 minutes 4 times per week.  Today's visit was #: 49 Starting weight: 406 lbs Starting date: 03/08/2017 Today's weight: 355 lbs Today's date: 09/16/2020 Total lbs lost to date: 51 Total lbs lost since last in-office visit: 3  Interim History: Louis [name] continues to do well with weight loss. His hunger is mostly controlled. He is working on meal planning and prepping. He is getting ready to start hunting which will increase activity.  Subjective:   1. Pre-diabetes [name] started [place] and he is tolerating it well. He denies nausea or vomiting.  2. Vitamin D deficiency Louis [name] is stable on Vit D, and he sometimes forgets to take regularly.  3. At risk for diabetes mellitus Louis [name] is at higher than average risk for developing diabetes due to obesity.   Assessment/Plan:   1. Pre-diabetes Louis [name] will continue to work on weight loss, exercise, and decreasing simple carbohydrates to help decrease the risk of diabetes. We will refill Mounjaro for 1 month, and we will recheck labs in 1-2 months.  - tirzepatide [facility]) 2.5 MG/0.5ML Pen; Inject 2.5 mg into the skin once a week.  Dispense: 2 mL; Refill: 0  2. Vitamin D deficiency Low Vitamin D level contributes to fatigue and are associated with obesity, breast, and colon cancer. [name] will continue prescription Vitamin D 50,000 IU every week as is. We will recheck recheck labs in 1-2 months and he will follow-up for routine testing of Vitamin D, at least 2-3 times per year to avoid over-replacement.  3. At risk for diabetes mellitus Louis [name] was given approximately 15 minutes of diabetes education and counseling today. We discussed  intensive lifestyle modifications today with an emphasis on weight loss as well as increasing exercise and decreasing simple carbohydrates in his diet. We also reviewed medication options with an emphasis on risk versus benefit of those discussed.   Repetitive spaced learning was employed today to elicit superior memory formation and behavioral change.  4. Obesity with current BMI 51.0 Louis [name] is currently in the action stage of change. As such, his goal is to continue with weight loss efforts. He has agreed to the Category 3 Plan.   Exercise goals: As is.  Behavioral modification strategies: increasing lean protein intake.  [name] has agreed to follow-up with our clinic in 4 weeks. He was informed of the importance of frequent follow-up visits to maximize his success with intensive lifestyle modifications for his multiple health conditions.   Objective:   Blood pressure 115/76, pulse 61, temperature 98 F (36.7 C), height 5\' 10"  (1.778 m), weight (!) 355 lb (161 kg), SpO2 96 %. Body mass index is 50.94 kg/m.  General: Cooperative, alert, well developed, in no acute distress. HEENT: Conjunctivae and lids unremarkable. Cardiovascular: Regular rhythm.  Lungs: Normal work of breathing. Neurologic: No focal deficits.   Lab Results  Component Value Date   CREATININE 1.17 04/28/2020   BUN 11 04/28/2020   NA 143 04/28/2020   K 4.3 04/28/2020   CL 100 04/28/2020   CO2 25 04/28/2020   Lab Results  Component Value Date   ALT 10 04/28/2020   AST 13 04/28/2020   ALKPHOS 71 04/28/2020  BILITOT 0.5 04/28/2020   Lab Results  Component Value Date   HGBA1C 5.5 04/28/2020   HGBA1C 5.3 10/22/2019   HGBA1C 5.4 05/15/2019   HGBA1C 5.5 02/05/2019   HGBA1C 5.6 07/17/2018   Lab Results  Component Value Date   INSULIN 4.2 04/28/2020   INSULIN 10.4 10/22/2019   INSULIN 9.1 05/15/2019   INSULIN 6.5 02/05/2019   INSULIN 11.7 07/17/2018   Lab Results  Component Value Date   TSH 1.260  03/08/2017   Lab Results  Component Value Date   CHOL 146 04/28/2020   HDL 45 04/28/2020   LDLCALC 90 04/28/2020   TRIG 53 04/28/2020   CHOLHDL 3.2 04/28/2020   Lab Results  Component Value Date   VD25OH 37.1 04/28/2020   VD25OH 43.1 10/22/2019   VD25OH 48.4 05/15/2019   Lab Results  Component Value Date   WBC 8.6 03/08/2017   HGB 13.2 03/08/2017   HCT 41.5 03/08/2017   MCV 90 03/08/2017   PLT 285 03/08/2017   No results found for: IRON, TIBC, FERRITIN  Attestation Statements:   Reviewed by clinician on day of visit: allergies, medications, problem list, medical history, surgical history, family history, social history, and previous encounter notes.   I, Burt Knack, am acting as transcriptionist for Quillian Quince, MD.  I have reviewed the above documentation for accuracy and completeness, and I agree with the above. -  Quillian Quince, MD

## 2020-09-20 ENCOUNTER — Other Ambulatory Visit (HOSPITAL_BASED_OUTPATIENT_CLINIC_OR_DEPARTMENT_OTHER): Payer: Self-pay

## 2020-09-21 ENCOUNTER — Other Ambulatory Visit (INDEPENDENT_AMBULATORY_CARE_PROVIDER_SITE_OTHER): Payer: Self-pay | Admitting: Family Medicine

## 2020-09-21 ENCOUNTER — Other Ambulatory Visit (HOSPITAL_BASED_OUTPATIENT_CLINIC_OR_DEPARTMENT_OTHER): Payer: Self-pay

## 2020-09-21 DIAGNOSIS — I10 Essential (primary) hypertension: Secondary | ICD-10-CM

## 2020-09-21 NOTE — Telephone Encounter (Signed)
Pt last seen by Dr. Beasley.  

## 2020-09-22 ENCOUNTER — Encounter (INDEPENDENT_AMBULATORY_CARE_PROVIDER_SITE_OTHER): Payer: Self-pay | Admitting: Emergency Medicine

## 2020-09-22 NOTE — Telephone Encounter (Signed)
Msg sent Via Mychart to see if patient need a refill before his next appt on Losartan

## 2020-09-29 ENCOUNTER — Other Ambulatory Visit (HOSPITAL_BASED_OUTPATIENT_CLINIC_OR_DEPARTMENT_OTHER): Payer: Self-pay

## 2020-10-01 ENCOUNTER — Other Ambulatory Visit (HOSPITAL_BASED_OUTPATIENT_CLINIC_OR_DEPARTMENT_OTHER): Payer: Self-pay

## 2020-10-14 ENCOUNTER — Other Ambulatory Visit (HOSPITAL_BASED_OUTPATIENT_CLINIC_OR_DEPARTMENT_OTHER): Payer: Self-pay

## 2020-10-14 ENCOUNTER — Other Ambulatory Visit: Payer: Self-pay

## 2020-10-14 ENCOUNTER — Encounter (INDEPENDENT_AMBULATORY_CARE_PROVIDER_SITE_OTHER): Payer: Self-pay | Admitting: Family Medicine

## 2020-10-14 ENCOUNTER — Ambulatory Visit (INDEPENDENT_AMBULATORY_CARE_PROVIDER_SITE_OTHER): Payer: PRIVATE HEALTH INSURANCE | Admitting: Family Medicine

## 2020-10-14 VITALS — BP 121/78 | HR 68 | Temp 98.2°F | Ht 70.0 in | Wt 353.0 lb

## 2020-10-14 DIAGNOSIS — Z6841 Body Mass Index (BMI) 40.0 and over, adult: Secondary | ICD-10-CM | POA: Diagnosis not present

## 2020-10-14 DIAGNOSIS — I10 Essential (primary) hypertension: Secondary | ICD-10-CM

## 2020-10-14 DIAGNOSIS — Z9189 Other specified personal risk factors, not elsewhere classified: Secondary | ICD-10-CM | POA: Diagnosis not present

## 2020-10-14 DIAGNOSIS — R7303 Prediabetes: Secondary | ICD-10-CM | POA: Diagnosis not present

## 2020-10-14 MED ORDER — TIRZEPATIDE 2.5 MG/0.5ML ~~LOC~~ SOAJ
2.5000 mg | SUBCUTANEOUS | 0 refills | Status: DC
Start: 2020-10-14 — End: 2021-04-21
  Filled 2020-10-14 – 2020-10-27 (×2): qty 2, 28d supply, fill #0

## 2020-10-14 NOTE — Progress Notes (Signed)
Chief Complaint:   OBESITY Louis [name] is here to discuss his progress with his obesity treatment plan along with follow-up of his obesity related diagnoses. Louis [name] is on the Category 3 Plan and states he is following his eating plan approximately 80% of the time. Louis [name] states he is walking for 15-20 minutes 4 times per week.  Today's visit was #: 50 Starting weight: 406 lbs Starting date: 03/08/2017 Today's weight: 353 lbs Today's date: 10/14/2020 Total lbs lost to date: 53 Total lbs lost since last in-office visit: 2  Interim History: Louis [name] continues to work on weight loss. He is mostly trying to portion control and make smarter choices.  Subjective:   1. Pre-diabetes Louis [name] started [place] but he felt increased irritability for the first 2 hours after the injection.  2. Essential hypertension Louis [name]'s blood pressure is stable on his medications. He takes his medications intermittently when he feels he needs it.  3. At risk for heart disease Louis [name] is at a higher than average risk for cardiovascular disease due to obesity.   Assessment/Plan:   1. Pre-diabetes Louis [name] will continue to work on weight loss, exercise, and decreasing simple carbohydrates to help decrease the risk of diabetes. We will refill Mounjaro for 1 month, Louis [name] is ok to change his dose to Friday PM.  - tirzepatide [facility]) 2.5 MG/0.5ML Pen; Inject 2.5 mg into the skin once a week.  Dispense: 2 mL; Refill: 0  2. Essential hypertension Louis [name] will continue to work on weight loss to improve blood pressure control. He will continue to monitor his blood pressure as he continues his lifestyle modifications.  3. At risk for heart disease Louis [name] was given approximately 15 minutes of coronary artery disease prevention counseling today. He is 45 y.o. male and has risk factors for heart disease including obesity. We discussed intensive lifestyle modifications today with an emphasis on specific weight loss instructions and  strategies.   Repetitive spaced learning was employed today to elicit superior memory formation and behavioral change.  4. Obesity with current BMI 50.8 Louis [name] is currently in the action stage of change. As such, his goal is to continue with weight loss efforts. He has agreed to the Category 3 Plan.   We will recheck fasting labs at his next visit.  Exercise goals: As is.  Behavioral modification strategies: increasing lean protein intake, no skipping meals, and meal planning and cooking strategies.  Louis [name] has agreed to follow-up with our clinic in 4 weeks. He was informed of the importance of frequent follow-up visits to maximize his success with intensive lifestyle modifications for his multiple health conditions.   Objective:   Blood pressure 121/78, pulse 68, temperature 98.2 F (36.8 C), height 5\' 10"  (1.778 m), weight (!) 353 lb (160.1 kg), SpO2 97 %. Body mass index is 50.65 kg/m.  General: Cooperative, alert, well developed, in no acute distress. HEENT: Conjunctivae and lids unremarkable. Cardiovascular: Regular rhythm.  Lungs: Normal work of breathing. Neurologic: No focal deficits.   Lab Results  Component Value Date   CREATININE 1.17 04/28/2020   BUN 11 04/28/2020   NA 143 04/28/2020   K 4.3 04/28/2020   CL 100 04/28/2020   CO2 25 04/28/2020   Lab Results  Component Value Date   ALT 10 04/28/2020   AST 13 04/28/2020   ALKPHOS 71 04/28/2020   BILITOT 0.5 04/28/2020   Lab Results  Component Value Date   HGBA1C 5.5 04/28/2020   HGBA1C 5.3 10/22/2019   HGBA1C 5.4 05/15/2019  HGBA1C 5.5 02/05/2019   HGBA1C 5.6 07/17/2018   Lab Results  Component Value Date   INSULIN 4.2 04/28/2020   INSULIN 10.4 10/22/2019   INSULIN 9.1 05/15/2019   INSULIN 6.5 02/05/2019   INSULIN 11.7 07/17/2018   Lab Results  Component Value Date   TSH 1.260 03/08/2017   Lab Results  Component Value Date   CHOL 146 04/28/2020   HDL 45 04/28/2020   LDLCALC 90 04/28/2020    TRIG 53 04/28/2020   CHOLHDL 3.2 04/28/2020   Lab Results  Component Value Date   VD25OH 37.1 04/28/2020   VD25OH 43.1 10/22/2019   VD25OH 48.4 05/15/2019   Lab Results  Component Value Date   WBC 8.6 03/08/2017   HGB 13.2 03/08/2017   HCT 41.5 03/08/2017   MCV 90 03/08/2017   PLT 285 03/08/2017   No results found for: IRON, TIBC, FERRITIN  Attestation Statements:   Reviewed by clinician on day of visit: allergies, medications, problem list, medical history, surgical history, family history, social history, and previous encounter notes.   I, Burt Knack, am acting as transcriptionist for Quillian Quince, MD.  I have reviewed the above documentation for accuracy and completeness, and I agree with the above. -  Quillian Quince, MD

## 2020-10-27 ENCOUNTER — Other Ambulatory Visit (HOSPITAL_BASED_OUTPATIENT_CLINIC_OR_DEPARTMENT_OTHER): Payer: Self-pay

## 2020-11-15 ENCOUNTER — Encounter (INDEPENDENT_AMBULATORY_CARE_PROVIDER_SITE_OTHER): Payer: Self-pay

## 2020-11-15 ENCOUNTER — Ambulatory Visit (INDEPENDENT_AMBULATORY_CARE_PROVIDER_SITE_OTHER): Payer: PRIVATE HEALTH INSURANCE | Admitting: Family Medicine

## 2020-11-16 ENCOUNTER — Encounter (INDEPENDENT_AMBULATORY_CARE_PROVIDER_SITE_OTHER): Payer: Self-pay

## 2021-01-19 ENCOUNTER — Ambulatory Visit: Payer: Self-pay | Admitting: Medical

## 2021-03-31 ENCOUNTER — Ambulatory Visit (INDEPENDENT_AMBULATORY_CARE_PROVIDER_SITE_OTHER): Payer: Managed Care, Other (non HMO) | Admitting: Nurse Practitioner

## 2021-03-31 ENCOUNTER — Ambulatory Visit (INDEPENDENT_AMBULATORY_CARE_PROVIDER_SITE_OTHER): Payer: PRIVATE HEALTH INSURANCE | Admitting: Physician Assistant

## 2021-03-31 ENCOUNTER — Encounter (INDEPENDENT_AMBULATORY_CARE_PROVIDER_SITE_OTHER): Payer: Self-pay | Admitting: Nurse Practitioner

## 2021-03-31 ENCOUNTER — Other Ambulatory Visit (HOSPITAL_BASED_OUTPATIENT_CLINIC_OR_DEPARTMENT_OTHER): Payer: Self-pay

## 2021-03-31 VITALS — BP 138/83 | HR 74 | Temp 98.1°F | Ht 70.0 in | Wt 358.0 lb

## 2021-03-31 DIAGNOSIS — I10 Essential (primary) hypertension: Secondary | ICD-10-CM | POA: Diagnosis not present

## 2021-03-31 DIAGNOSIS — R7303 Prediabetes: Secondary | ICD-10-CM | POA: Diagnosis not present

## 2021-03-31 DIAGNOSIS — E559 Vitamin D deficiency, unspecified: Secondary | ICD-10-CM

## 2021-03-31 DIAGNOSIS — E669 Obesity, unspecified: Secondary | ICD-10-CM | POA: Diagnosis not present

## 2021-03-31 DIAGNOSIS — Z9189 Other specified personal risk factors, not elsewhere classified: Secondary | ICD-10-CM

## 2021-03-31 DIAGNOSIS — Z13220 Encounter for screening for lipoid disorders: Secondary | ICD-10-CM

## 2021-03-31 DIAGNOSIS — Z6841 Body Mass Index (BMI) 40.0 and over, adult: Secondary | ICD-10-CM

## 2021-03-31 MED ORDER — LOSARTAN POTASSIUM 100 MG PO TABS
100.0000 mg | ORAL_TABLET | Freq: Every day | ORAL | 0 refills | Status: DC
  Filled 2021-03-31 – 2021-06-26 (×4): qty 30, 30d supply, fill #0

## 2021-04-01 LAB — CBC WITH DIFFERENTIAL/PLATELET
Basophils Absolute: 0 10*3/uL (ref 0.0–0.2)
Basos: 1 %
EOS (ABSOLUTE): 0.1 10*3/uL (ref 0.0–0.4)
Eos: 1 %
Hematocrit: 45.4 % (ref 37.5–51.0)
Hemoglobin: 14.9 g/dL (ref 13.0–17.7)
Immature Grans (Abs): 0 10*3/uL (ref 0.0–0.1)
Immature Granulocytes: 0 %
Lymphocytes Absolute: 1.7 10*3/uL (ref 0.7–3.1)
Lymphs: 23 %
MCH: 28.9 pg (ref 26.6–33.0)
MCHC: 32.8 g/dL (ref 31.5–35.7)
MCV: 88 fL (ref 79–97)
Monocytes Absolute: 0.4 10*3/uL (ref 0.1–0.9)
Monocytes: 5 %
Neutrophils Absolute: 5.1 10*3/uL (ref 1.4–7.0)
Neutrophils: 70 %
Platelets: 240 10*3/uL (ref 150–450)
RBC: 5.15 x10E6/uL (ref 4.14–5.80)
RDW: 12.4 % (ref 11.6–15.4)
WBC: 7.3 10*3/uL (ref 3.4–10.8)

## 2021-04-01 LAB — COMPREHENSIVE METABOLIC PANEL
ALT: 15 IU/L (ref 0–44)
AST: 19 IU/L (ref 0–40)
Albumin/Globulin Ratio: 1.4 (ref 1.2–2.2)
Albumin: 4 g/dL (ref 4.0–5.0)
Alkaline Phosphatase: 86 IU/L (ref 44–121)
BUN/Creatinine Ratio: 11 (ref 9–20)
BUN: 12 mg/dL (ref 6–24)
Bilirubin Total: 0.3 mg/dL (ref 0.0–1.2)
CO2: 27 mmol/L (ref 20–29)
Calcium: 8.8 mg/dL (ref 8.7–10.2)
Chloride: 103 mmol/L (ref 96–106)
Creatinine, Ser: 1.05 mg/dL (ref 0.76–1.27)
Globulin, Total: 2.9 g/dL (ref 1.5–4.5)
Glucose: 93 mg/dL (ref 70–99)
Potassium: 4.5 mmol/L (ref 3.5–5.2)
Sodium: 143 mmol/L (ref 134–144)
Total Protein: 6.9 g/dL (ref 6.0–8.5)
eGFR: 89 mL/min/{1.73_m2} (ref >59)

## 2021-04-01 LAB — INSULIN, RANDOM: INSULIN: 6.3 u[IU]/mL (ref 2.6–24.9)

## 2021-04-01 LAB — LIPID PANEL WITH LDL/HDL RATIO
Cholesterol, Total: 154 mg/dL (ref 100–199)
HDL: 54 mg/dL (ref >39)
LDL Chol Calc (NIH): 90 mg/dL (ref 0–99)
LDL/HDL Ratio: 1.7 ratio (ref 0.0–3.6)
Triglycerides: 46 mg/dL (ref 0–149)
VLDL Cholesterol Cal: 10 mg/dL (ref 5–40)

## 2021-04-01 LAB — HEMOGLOBIN A1C
Est. average glucose Bld gHb Est-mCnc: 114 mg/dL
Hgb A1c MFr Bld: 5.6 % (ref 4.8–5.6)

## 2021-04-01 LAB — VITAMIN D 25 HYDROXY (VIT D DEFICIENCY, FRACTURES): Vit D, 25-Hydroxy: 28 ng/mL — ABNORMAL LOW (ref 30.0–100.0)

## 2021-04-01 NOTE — Progress Notes (Signed)
? ? ? ?Chief Complaint:  ? ?OBESITY ?Louis [name] is here to discuss his progress with his obesity treatment plan along with follow-up of his obesity related diagnoses. Louis [name] is on the Category 3 Plan and states he is following his eating plan approximately 50% of the time. Louis [name] states he is walking the dog for 20 minutes 5 times per week. ? ?Today's visit was #: 51 ?Starting weight: 406 lbs ?Starting date: 03/08/2017 ?Today's weight: 358 lbs ?Today's date: 03/31/2021 ?Total lbs lost to date: 48 lbs ?Total lbs lost since last in-office visit: 0 ? ?Interim History: Louis [name] was seen here last on 10/14/2020. He has gotten off track since his last visit. He denies overeating or craving. He drinks water, [employer], occasional sodas, juice, and Gatorade.  ? ?Subjective:  ? ?1. Essential hypertension ?Louis [name] is currently taking Cozaar 100 mg. He denies side effects. Denies chest pain, shortness of breath and palpitations.  ? ?2. Vitamin D deficiency ?Louis [name] is not currently taking Vitamin D.  ? ?3. Pre-diabetes ?Louis [name] last A1C was 5.5. He took Science writer, [place], Saxenda, Metformin, and Rybelsus in the past. He noted side effect of irritability with Mounjaro. ? ?4. Hyperlipidemia ?We discussed hyperlipidemia.  Not on a statin.   ? ?5. At risk for diabetes mellitus ?Louis [name] is at higher than average risk for developing diabetes due to his obesity.  ? ?Assessment/Plan:  ? ?1. Essential hypertension ?We will refill Cozaar 100 mg for 1 month with 1 refills. Labs were obtained today. Louis [name] is working on healthy weight loss and exercise to improve blood pressure control. We will watch for signs of hypotension as he continues his lifestyle modifications. ? ?- losartan (COZAAR) 100 MG tablet; Take 1 tablet (100 mg total) by mouth daily.  Dispense: 30 tablet; Refill: 0 ?- Comprehensive metabolic panel ?- CBC with Differential/Platelet ? ?2. Vitamin D deficiency ?Low Vitamin D level contributes to fatigue and are associated with obesity, breast,  and colon cancer. Louis [name] agrees to continue to take prescription Vitamin D 50,000 IU every week and he will follow-up for routine testing of Vitamin D, at least 2-3 times per year to avoid over-replacement. Labs were obtained today.  ? ?- VITAMIN D 25 Hydroxy (Vit-D Deficiency, Fractures) ?- Comprehensive metabolic panel ?- CBC with Differential/Platelet ? ?3. Pre-diabetes ?Labs were obtained today. Louis [name] will continue to work on weight loss, exercise, and decreasing simple carbohydrates to help decrease the risk of diabetes.  ? ?- Hemoglobin A1c ?- Insulin, random ?- Comprehensive metabolic panel ?- CBC with Differential/Platelet ? ?4. Hyperlipidemia ?Cardiovascular risk and specific lipid/LDL goals reviewed. We obtained labs today.  We discussed several lifestyle modifications today and Louis [name] will continue to work on diet, exercise and weight loss efforts. Orders and follow up as documented in patient record.  ? ?Counseling ?Intensive lifestyle modifications are the first line treatment for this issue. ?Dietary changes: Increase soluble fiber. Decrease simple carbohydrates. ?Exercise changes: Moderate to vigorous-intensity aerobic activity 150 minutes per week if tolerated. ?Lipid-lowering medications: see documented in medical record. ? ?- Lipid Panel With LDL/HDL Ratio ? ?5. At risk for diabetes mellitus ?Louis [name] was given approximately 15 minutes of diabetic education and counseling today. We discussed intensive lifestyle modifications today with an emphasis on weight loss as well as increasing exercise and decreasing simple carbohydrates in his diet. We also reviewed medication options with an emphasis on risk versus benefits of those discussed. ? ?Repetitive spaced learning was employed today to elicit superior memory formation and behavioral change.  ? ?6.  Obesity with current BMI 51.5 ?Louis [name] is currently in the action stage of change. As such, his goal is to continue with weight loss efforts. He has agreed to  the Category 3 Plan.  ? ?Exercise goals:  As is.  ? ?Behavioral modification strategies: increasing lean protein intake, increasing water intake, no skipping meals, and meal planning and cooking strategies. ? ?Louis [name] has agreed to follow-up with our clinic in 2 weeks. He was informed of the importance of frequent follow-up visits to maximize his success with intensive lifestyle modifications for his multiple health conditions.  ? ?Louis [name] was informed we would discuss his lab results at his next visit unless there is a critical issue that needs to be addressed sooner. [name] agreed to keep his next visit at the agreed upon time to discuss these results. ? ?Objective:  ? ?Blood pressure 138/83, pulse 74, temperature 98.1 ?F (36.7 ?C), height 5\' 10"  (1.778 m), weight (!) 358 lb (162.4 kg), SpO2 97 %. ?Body mass index is 51.37 kg/m?. ? ?General: Cooperative, alert, well developed, in no acute distress. ?HEENT: Conjunctivae and lids unremarkable. ?Cardiovascular: Regular rhythm.  ?Lungs: Normal work of breathing. ?Neurologic: No focal deficits.  ? ?Lab Results  ?Component Value Date  ? CREATININE 1.05 03/31/2021  ? BUN 12 03/31/2021  ? NA 143 03/31/2021  ? K 4.5 03/31/2021  ? CL 103 03/31/2021  ? CO2 27 03/31/2021  ? ?Lab Results  ?Component Value Date  ? ALT 15 03/31/2021  ? AST 19 03/31/2021  ? ALKPHOS 86 03/31/2021  ? BILITOT 0.3 03/31/2021  ? ?Lab Results  ?Component Value Date  ? HGBA1C 5.6 03/31/2021  ? HGBA1C 5.5 04/28/2020  ? HGBA1C 5.3 10/22/2019  ? HGBA1C 5.4 05/15/2019  ? HGBA1C 5.5 02/05/2019  ? ?Lab Results  ?Component Value Date  ? INSULIN 6.3 03/31/2021  ? INSULIN 4.2 04/28/2020  ? INSULIN 10.4 10/22/2019  ? INSULIN 9.1 05/15/2019  ? INSULIN 6.5 02/05/2019  ? ?Lab Results  ?Component Value Date  ? TSH 1.260 03/08/2017  ? ?Lab Results  ?Component Value Date  ? CHOL 154 03/31/2021  ? HDL 54 03/31/2021  ? LDLCALC 90 03/31/2021  ? TRIG 46 03/31/2021  ? CHOLHDL 3.2 04/28/2020  ? ?Lab Results  ?Component Value Date   ? VD25OH 28.0 (L) 03/31/2021  ? VD25OH 37.1 04/28/2020  ? VD25OH 43.1 10/22/2019  ? ?Lab Results  ?Component Value Date  ? WBC 7.3 03/31/2021  ? HGB 14.9 03/31/2021  ? HCT 45.4 03/31/2021  ? MCV 88 03/31/2021  ? PLT 240 03/31/2021  ? ?No results found for: IRON, TIBC, FERRITIN ? ?Attestation Statements:  ? ?Reviewed by clinician on day of visit: allergies, medications, problem list, medical history, surgical history, family history, social history, and previous encounter notes. ? ? ?I, 04/02/2021, RMA, am acting as [place] for Energy manager, FNP. ? ?I have reviewed the above documentation for accuracy and completeness, and I agree with the above. Irene Limbo, FNP  ?

## 2021-04-08 ENCOUNTER — Other Ambulatory Visit (HOSPITAL_BASED_OUTPATIENT_CLINIC_OR_DEPARTMENT_OTHER): Payer: Self-pay

## 2021-04-14 ENCOUNTER — Ambulatory Visit (INDEPENDENT_AMBULATORY_CARE_PROVIDER_SITE_OTHER): Payer: PRIVATE HEALTH INSURANCE | Admitting: Physician Assistant

## 2021-04-21 ENCOUNTER — Other Ambulatory Visit (HOSPITAL_BASED_OUTPATIENT_CLINIC_OR_DEPARTMENT_OTHER): Payer: Self-pay

## 2021-04-21 ENCOUNTER — Ambulatory Visit (INDEPENDENT_AMBULATORY_CARE_PROVIDER_SITE_OTHER): Payer: Managed Care, Other (non HMO) | Admitting: Physician Assistant

## 2021-04-21 ENCOUNTER — Encounter (INDEPENDENT_AMBULATORY_CARE_PROVIDER_SITE_OTHER): Payer: Self-pay | Admitting: Physician Assistant

## 2021-04-21 VITALS — BP 130/82 | Ht 70.0 in | Wt 359.0 lb

## 2021-04-21 DIAGNOSIS — E669 Obesity, unspecified: Secondary | ICD-10-CM

## 2021-04-21 DIAGNOSIS — Z9189 Other specified personal risk factors, not elsewhere classified: Secondary | ICD-10-CM

## 2021-04-21 DIAGNOSIS — Z6841 Body Mass Index (BMI) 40.0 and over, adult: Secondary | ICD-10-CM | POA: Diagnosis not present

## 2021-04-21 DIAGNOSIS — E559 Vitamin D deficiency, unspecified: Secondary | ICD-10-CM

## 2021-04-21 DIAGNOSIS — R7303 Prediabetes: Secondary | ICD-10-CM

## 2021-04-21 MED ORDER — VITAMIN D (ERGOCALCIFEROL) 1.25 MG (50000 UNIT) PO CAPS
50000.0000 [IU] | ORAL_CAPSULE | ORAL | 0 refills | Status: DC
  Filled 2021-04-21: qty 4, 28d supply, fill #0

## 2021-04-21 MED ORDER — WEGOVY 0.5 MG/0.5ML ~~LOC~~ SOAJ
0.5000 mg | SUBCUTANEOUS | 0 refills | Status: DC
  Filled 2021-04-21 – 2021-05-12 (×4): qty 2, 28d supply, fill #0

## 2021-04-22 ENCOUNTER — Other Ambulatory Visit (HOSPITAL_BASED_OUTPATIENT_CLINIC_OR_DEPARTMENT_OTHER): Payer: Self-pay

## 2021-04-25 ENCOUNTER — Other Ambulatory Visit (HOSPITAL_BASED_OUTPATIENT_CLINIC_OR_DEPARTMENT_OTHER): Payer: Self-pay

## 2021-04-26 ENCOUNTER — Other Ambulatory Visit (HOSPITAL_BASED_OUTPATIENT_CLINIC_OR_DEPARTMENT_OTHER): Payer: Self-pay

## 2021-04-27 ENCOUNTER — Other Ambulatory Visit (HOSPITAL_BASED_OUTPATIENT_CLINIC_OR_DEPARTMENT_OTHER): Payer: Self-pay

## 2021-04-28 ENCOUNTER — Other Ambulatory Visit (HOSPITAL_BASED_OUTPATIENT_CLINIC_OR_DEPARTMENT_OTHER): Payer: Self-pay

## 2021-04-29 ENCOUNTER — Other Ambulatory Visit (HOSPITAL_BASED_OUTPATIENT_CLINIC_OR_DEPARTMENT_OTHER): Payer: Self-pay

## 2021-05-01 NOTE — Progress Notes (Signed)
? ? ? ?Chief Complaint:  ? ?OBESITY ?Louis [name] is here to discuss his progress with his obesity treatment plan along with follow-up of his obesity related diagnoses. Louis [name] is on the Category 3 Plan and states he is following his eating plan approximately 90% of the time. Louis [name] states he is walking 2 miles 5 times per week.   ? ?Today's visit was #: 52 ?Starting weight: 406 lbs ?Starting date: 03/08/2017 ?Today's weight: 359 lbs ?Today's date: 04/21/2021 ?Total lbs lost to date: 60 ?Total lbs lost since last in-office visit: 0 ? 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 He feels like he has been eating more things off the plan. He sometimes drinks regular Gatorade and soda. He was on Mounjaro 2.5 mg without side effects but he did not tell a difference in appetite. ? ?Subjective:  ? ?1. Pre-diabetes ?Louis [name]'s last A1c was 5.6. He was on Mounjaro 2.5 mg with no side effects. He denies polyphagia. I discussed labs with the patient today. ? ?2. Vitamin D deficiency ?Louis [name] is on Vitamin D, and he denies nausea, vomiting, or muscle weakness.  ? ?3. At risk for diabetes mellitus ?Louis [name] is at higher than average risk for developing diabetes due to his obesity. ? ?Assessment/Plan:  ? ?1. Pre-diabetes ?Louis [name] agreed to discontinue Mounjaro and start Wegovy 0.5 mg weekly. We will follow up at his next visit. ? ?2. Vitamin D deficiency ?Louis [name] will continue prescription Vitamin D, and we will refill for 1 month. ? ?- Vitamin D, Ergocalciferol, (DRISDOL) 1.25 MG (50000 UNIT) CAPS capsule; Take 1 capsule (50,000 Units total) by mouth every 7 (seven) days.  Dispense: 4 capsule; Refill: 0 ? ?3. At risk for diabetes mellitus ?Louis [name] was given approximately 15 minutes of diabetic education and counseling today. We discussed intensive lifestyle modifications today with an emphasis on weight loss as well as increasing exercise and decreasing simple  carbohydrates in his diet. We also reviewed medication options with an emphasis on risk versus benefits of those discussed. ? ?Repetitive spaced learning was employed today to elicit superior memory formation and behavioral change. ? ?4. Obesity with current BMI 51.5 ?Louis [name] is currently in the action stage of change. As such, his goal is to continue with weight loss efforts. He has agreed to change to the Category 4 Plan.  ? ?We discussed various medication options to help Louis [name] with his weight loss efforts and we both agreed to start Wegovy 0.5 mg weekly with no refills. ? ?- Semaglutide-Weight Management (WEGOVY) 0.5 MG/0.5ML SOAJ; Inject 0.5 mg into the skin once a week.  Dispense: 2 mL; Refill: 0 ? ?Exercise goals: As is. ? ?Behavioral modification strategies: decreasing liquid calories and meal planning and cooking strategies. ? ?Louis [name] has agreed to follow-up with our clinic in 4 weeks. He was informed of the importance of frequent follow-up visits to maximize his success with intensive lifestyle modifications for his multiple health conditions.  ? ?Objective:  ? ?Blood pressure 130/82, height 5\' 10"  (1.778 m), weight (!) 359 lb (162.8 kg). ?Body mass index is 51.51 kg/m?. ? ?General: Cooperative, alert, well developed, in no acute distress. ?HEENT: Conjunctivae and lids unremarkable. ?Cardiovascular: Regular rhythm.  ?Lungs: Normal work of breathing. ?Neurologic: No focal deficits.  ? ?Lab Results  ?Component Value Date  ? CREATININE 1.05 03/31/2021  ? BUN 12 03/31/2021  ? NA 143 03/31/2021  ? K 4.5 03/31/2021  ?  CL 103 03/31/2021  ? CO2 27 03/31/2021  ? ?Lab Results  ?Component Value Date  ? ALT 15 03/31/2021  ? AST 19 03/31/2021  ? ALKPHOS 86 03/31/2021  ? BILITOT 0.3 03/31/2021  ? ?Lab Results  ?Component Value Date  ? HGBA1C 5.6 03/31/2021  ? HGBA1C 5.5 04/28/2020  ? HGBA1C 5.3 10/22/2019  ? HGBA1C 5.4 05/15/2019  ? HGBA1C 5.5 02/05/2019  ? ?Lab Results  ?Component Value Date  ? INSULIN 6.3 03/31/2021  ?  INSULIN 4.2 04/28/2020  ? INSULIN 10.4 10/22/2019  ? INSULIN 9.1 05/15/2019  ? INSULIN 6.5 02/05/2019  ? ?Lab Results  ?Component Value Date  ? TSH 1.260 03/08/2017  ? ?Lab Results  ?Component Value Date  ? CHOL 154 03/31/2021  ? HDL 54 03/31/2021  ? LDLCALC 90 03/31/2021  ? TRIG 46 03/31/2021  ? CHOLHDL 3.2 04/28/2020  ? ?Lab Results  ?Component Value Date  ? VD25OH 28.0 (L) 03/31/2021  ? VD25OH 37.1 04/28/2020  ? VD25OH 43.1 10/22/2019  ? ?Lab Results  ?Component Value Date  ? WBC 7.3 03/31/2021  ? HGB 14.9 03/31/2021  ? HCT 45.4 03/31/2021  ? MCV 88 03/31/2021  ? PLT 240 03/31/2021  ? ?No results found for: IRON, TIBC, FERRITIN ? ?Attestation Statements:  ? ?Reviewed by clinician on day of visit: allergies, medications, problem list, medical history, surgical history, family history, social history, and previous encounter notes. ? ? ?I, Burt Knack, am acting as transcriptionist for Alois Cliche, PA-C. ? ?I have reviewed the above documentation for accuracy and completeness, and I agree with the above. -  .mec ? ?

## 2021-05-02 ENCOUNTER — Other Ambulatory Visit (HOSPITAL_BASED_OUTPATIENT_CLINIC_OR_DEPARTMENT_OTHER): Payer: Self-pay

## 2021-05-02 ENCOUNTER — Telehealth (INDEPENDENT_AMBULATORY_CARE_PROVIDER_SITE_OTHER): Payer: Self-pay | Admitting: Family Medicine

## 2021-05-02 NOTE — Telephone Encounter (Signed)
Patient last seen 04/21/21. No mention of Rybelsus, please advise.Marland Kitchen

## 2021-05-02 NOTE — Telephone Encounter (Signed)
Please send script for RYBELSUS tablets per patient request.  Pt reports discussing with Tracey at last visit due to [facility] not being covered with insurance.

## 2021-05-03 ENCOUNTER — Encounter (INDEPENDENT_AMBULATORY_CARE_PROVIDER_SITE_OTHER): Payer: Self-pay

## 2021-05-03 ENCOUNTER — Other Ambulatory Visit (HOSPITAL_BASED_OUTPATIENT_CLINIC_OR_DEPARTMENT_OTHER): Payer: Self-pay

## 2021-05-03 NOTE — Telephone Encounter (Signed)
Unable to reach patient. LMOM for pt. Mychart message also sent. ?

## 2021-05-04 ENCOUNTER — Other Ambulatory Visit (HOSPITAL_BASED_OUTPATIENT_CLINIC_OR_DEPARTMENT_OTHER): Payer: Self-pay

## 2021-05-04 ENCOUNTER — Encounter (INDEPENDENT_AMBULATORY_CARE_PROVIDER_SITE_OTHER): Payer: Self-pay | Admitting: Family Medicine

## 2021-05-04 ENCOUNTER — Ambulatory Visit (INDEPENDENT_AMBULATORY_CARE_PROVIDER_SITE_OTHER): Payer: Managed Care, Other (non HMO) | Admitting: Family Medicine

## 2021-05-04 VITALS — BP 123/79 | HR 68 | Temp 98.0°F | Ht 70.0 in | Wt 365.0 lb

## 2021-05-04 DIAGNOSIS — Z6841 Body Mass Index (BMI) 40.0 and over, adult: Secondary | ICD-10-CM | POA: Diagnosis not present

## 2021-05-04 DIAGNOSIS — R7303 Prediabetes: Secondary | ICD-10-CM

## 2021-05-04 DIAGNOSIS — E559 Vitamin D deficiency, unspecified: Secondary | ICD-10-CM | POA: Diagnosis not present

## 2021-05-04 DIAGNOSIS — E669 Obesity, unspecified: Secondary | ICD-10-CM

## 2021-05-04 DIAGNOSIS — Z9189 Other specified personal risk factors, not elsewhere classified: Secondary | ICD-10-CM

## 2021-05-04 MED ORDER — RYBELSUS 7 MG PO TABS
7.0000 mg | ORAL_TABLET | Freq: Every day | ORAL | 0 refills | Status: DC
Start: 2021-05-04 — End: 2021-07-20
  Filled 2021-05-04 – 2021-06-26 (×2): qty 30, 30d supply, fill #0

## 2021-05-04 MED ORDER — VITAMIN D (ERGOCALCIFEROL) 1.25 MG (50000 UNIT) PO CAPS
50000.0000 [IU] | ORAL_CAPSULE | ORAL | 0 refills | Status: DC
  Filled 2021-05-04: qty 4, 28d supply, fill #0

## 2021-05-05 ENCOUNTER — Other Ambulatory Visit (HOSPITAL_BASED_OUTPATIENT_CLINIC_OR_DEPARTMENT_OTHER): Payer: Self-pay

## 2021-05-06 ENCOUNTER — Other Ambulatory Visit (HOSPITAL_BASED_OUTPATIENT_CLINIC_OR_DEPARTMENT_OTHER): Payer: Self-pay

## 2021-05-09 ENCOUNTER — Telehealth (INDEPENDENT_AMBULATORY_CARE_PROVIDER_SITE_OTHER): Payer: Self-pay | Admitting: Physician Assistant

## 2021-05-09 ENCOUNTER — Encounter (INDEPENDENT_AMBULATORY_CARE_PROVIDER_SITE_OTHER): Payer: Self-pay

## 2021-05-09 ENCOUNTER — Other Ambulatory Visit (HOSPITAL_BASED_OUTPATIENT_CLINIC_OR_DEPARTMENT_OTHER): Payer: Self-pay

## 2021-05-09 NOTE — Telephone Encounter (Signed)
Louis [name]/Dr.Beasley -  I spent 46 minutes on hold before representative answered call. Patient was prescribed Wegovy by Linus Orn on 04/21/21. Prior authorization was started in covermymeds on 04/21/21 and no response has been received as of today, and it has been 18 days since request was submitted. I called [employer] to get the status of request. Decision has not been made and they are behind. Representative stated we should be hearing from response soon.Case ID# [id number]. ? ?On 05/05/21 - Dr. Leafy Ro - Rx for Rybelsus  was sent in - prior authorization was submitted in covermymeds for Rybelsus. During the same phone call about Wegovy, I inquired about the status of Rybelsus. They have received the request and decision has not been made. The process time will be about the same time more than 14 day, as they are behind. Case ID# [id number]. ? ?I have sent patient a message in mychart to let him know the status.  ? ? ? ?

## 2021-05-10 ENCOUNTER — Other Ambulatory Visit (HOSPITAL_BASED_OUTPATIENT_CLINIC_OR_DEPARTMENT_OTHER): Payer: Self-pay

## 2021-05-11 ENCOUNTER — Other Ambulatory Visit (HOSPITAL_BASED_OUTPATIENT_CLINIC_OR_DEPARTMENT_OTHER): Payer: Self-pay

## 2021-05-12 ENCOUNTER — Other Ambulatory Visit (HOSPITAL_BASED_OUTPATIENT_CLINIC_OR_DEPARTMENT_OTHER): Payer: Self-pay

## 2021-05-12 ENCOUNTER — Encounter (INDEPENDENT_AMBULATORY_CARE_PROVIDER_SITE_OTHER): Payer: Self-pay

## 2021-05-12 ENCOUNTER — Telehealth (INDEPENDENT_AMBULATORY_CARE_PROVIDER_SITE_OTHER): Payer: Self-pay

## 2021-05-12 NOTE — Telephone Encounter (Signed)
[name]/Dr. Leafy Ro - Prior authorization has been approved for Louis [name]. Effective:04/21/2021 to 05/12/2022. Patient sent approval message via mychart. ? ?Prior authorization denied for Rybelsus. Per insurance: patient does not have type 2 diabetes. Patient sent denial message via mychart.  ?

## 2021-05-12 NOTE — Telephone Encounter (Signed)
Unfortunately your insurance will not approve ozempic due to not having diabetes. Have you tried the wegovy coupon online?

## 2021-05-12 NOTE — Telephone Encounter (Signed)
Patient called and states that he talked to his pharmacy and they don't have Wegovy in the mg he suppose to start plus it would be over 1,000. Rybelsus was denied by his insurance. Patient would like to see if he could be started on Ozempic or anything else. Patient would like a call back or a mychart message if he doesn't answer.  ?

## 2021-05-13 ENCOUNTER — Other Ambulatory Visit (HOSPITAL_BASED_OUTPATIENT_CLINIC_OR_DEPARTMENT_OTHER): Payer: Self-pay

## 2021-05-16 ENCOUNTER — Other Ambulatory Visit (HOSPITAL_BASED_OUTPATIENT_CLINIC_OR_DEPARTMENT_OTHER): Payer: Self-pay

## 2021-05-17 ENCOUNTER — Other Ambulatory Visit (HOSPITAL_BASED_OUTPATIENT_CLINIC_OR_DEPARTMENT_OTHER): Payer: Self-pay

## 2021-05-17 MED ORDER — SEMAGLUTIDE-WEIGHT MANAGEMENT 1 MG/0.5ML ~~LOC~~ SOAJ: 1.0000 mg | SUBCUTANEOUS | 0 refills | Status: DC

## 2021-05-17 NOTE — Addendum Note (Signed)
Addended by: Theola Sequin on: 05/17/2021 05:19 PM ? ? Modules accepted: Orders ? ?

## 2021-05-17 NOTE — Telephone Encounter (Signed)
Send in 1mg  dose x 1 month

## 2021-05-18 ENCOUNTER — Other Ambulatory Visit (HOSPITAL_BASED_OUTPATIENT_CLINIC_OR_DEPARTMENT_OTHER): Payer: Self-pay

## 2021-05-18 NOTE — Progress Notes (Signed)
Chief Complaint:   OBESITY Louis [name] is here to discuss his progress with his obesity treatment plan along with follow-up of his obesity related diagnoses. Louis [name] is on the Category 4 Plan and states he is following his eating plan approximately 90% of the time. Louis [name] states he is walking for 20 minutes 4 times per week.  Today's visit was #: 53 Starting weight: 406 lbs Starting date: 03/08/2017 Today's weight: 365 lbs Today's date: 05/04/2021 Total lbs lost to date: 41 Total lbs lost since last in-office visit: 0  Interim History: Louis [name]'s insurance denied [employer]. He continues to work on diet, exercise, and weight loss. He is retaining some water weight today.   Subjective:   1. Pre-diabetes Louis [name] has been on various GLP-1 medications in the past. He would like to restart Rybelsus as he tolerated this well previously.   2. Vitamin D deficiency Louis [name] restarted Vitamin D, and his level is not yet at goal.   3. At risk for heart disease Louis [name] is at higher than average risk for cardiovascular disease due to obesity.  Assessment/Plan:   1. Pre-diabetes Louis [name] agreed to start Rybelsus 7 mg daily with no refills. Louis [name] will continue to work on weight loss, exercise, and decreasing simple carbohydrates to help decrease the risk of diabetes.   - Semaglutide (RYBELSUS) 7 MG TABS; Take 1 tablet by mouth daily.  Dispense: 30 tablet; Refill: 0  2. Vitamin D deficiency Louis [name] will continue prescription Vitamin D 50,000 IU every week and we will refill for 1 month. He will follow-up for routine testing of Vitamin D, at least 2-3 times per year to avoid over-replacement.  - Vitamin D, Ergocalciferol, (DRISDOL) 1.25 MG (50000 UNIT) CAPS capsule; Take 1 capsule (50,000 Units total) by mouth every 7 (seven) days.  Dispense: 4 capsule; Refill: 0  3. At risk for heart disease Louis [name] was given approximately 15 minutes of coronary artery disease prevention counseling today. He is 46 y.o. male and has  risk factors for heart disease including obesity. We discussed intensive lifestyle modifications today with an emphasis on specific weight loss instructions and strategies.  Repetitive spaced learning was employed today to elicit superior memory formation and behavioral change.   4. Obesity, Current BMI 52.4 Louis [name] is currently in the action stage of change. As such, his goal is to continue with weight loss efforts. He has agreed to the Category 4 Plan.   Exercise goals: As is.  Behavioral modification strategies: increasing lean protein intake and no skipping meals.  Louis [name] has agreed to follow-up with our clinic in 4 weeks. He was informed of the importance of frequent follow-up visits to maximize his success with intensive lifestyle modifications for his multiple health conditions.   Objective:   Blood pressure 123/79, pulse 68, temperature 98 F (36.7 C), height 5\' 10"  (1.778 m), weight (!) 365 lb (165.6 kg), SpO2 98 %. Body mass index is 52.37 kg/m.  General: Cooperative, alert, well developed, in no acute distress. HEENT: Conjunctivae and lids unremarkable. Cardiovascular: Regular rhythm.  Lungs: Normal work of breathing. Neurologic: No focal deficits.   Lab Results  Component Value Date   CREATININE 1.05 03/31/2021   BUN 12 03/31/2021   NA 143 03/31/2021   K 4.5 03/31/2021   CL 103 03/31/2021   CO2 27 03/31/2021   Lab Results  Component Value Date   ALT 15 03/31/2021   AST 19 03/31/2021   ALKPHOS 86 03/31/2021   BILITOT 0.3 03/31/2021   Lab Results  Component Value Date   HGBA1C 5.6 03/31/2021   HGBA1C 5.5 04/28/2020   HGBA1C 5.3 10/22/2019   HGBA1C 5.4 05/15/2019   HGBA1C 5.5 02/05/2019   Lab Results  Component Value Date   INSULIN 6.3 03/31/2021   INSULIN 4.2 04/28/2020   INSULIN 10.4 10/22/2019   INSULIN 9.1 05/15/2019   INSULIN 6.5 02/05/2019   Lab Results  Component Value Date   TSH 1.260 03/08/2017   Lab Results  Component Value Date   CHOL  154 03/31/2021   HDL 54 03/31/2021   LDLCALC 90 03/31/2021   TRIG 46 03/31/2021   CHOLHDL 3.2 04/28/2020   Lab Results  Component Value Date   VD25OH 28.0 (L) 03/31/2021   VD25OH 37.1 04/28/2020   VD25OH 43.1 10/22/2019   Lab Results  Component Value Date   WBC 7.3 03/31/2021   HGB 14.9 03/31/2021   HCT 45.4 03/31/2021   MCV 88 03/31/2021   PLT 240 03/31/2021   No results found for: IRON, TIBC, FERRITIN  Attestation Statements:   Reviewed by clinician on day of visit: allergies, medications, problem list, medical history, surgical history, family history, social history, and previous encounter notes.   I, Burt Knack, am acting as transcriptionist for Quillian Quince, MD.  I have reviewed the above documentation for accuracy and completeness, and I agree with the above. -  Quillian Quince, MD

## 2021-05-19 ENCOUNTER — Ambulatory Visit (INDEPENDENT_AMBULATORY_CARE_PROVIDER_SITE_OTHER): Payer: Managed Care, Other (non HMO) | Admitting: Family Medicine

## 2021-06-02 ENCOUNTER — Other Ambulatory Visit (HOSPITAL_BASED_OUTPATIENT_CLINIC_OR_DEPARTMENT_OTHER): Payer: Self-pay

## 2021-06-02 ENCOUNTER — Ambulatory Visit (INDEPENDENT_AMBULATORY_CARE_PROVIDER_SITE_OTHER): Payer: Managed Care, Other (non HMO) | Admitting: Family Medicine

## 2021-06-02 ENCOUNTER — Encounter (INDEPENDENT_AMBULATORY_CARE_PROVIDER_SITE_OTHER): Payer: Self-pay | Admitting: Family Medicine

## 2021-06-02 VITALS — BP 120/78 | HR 69 | Temp 97.9°F | Ht 70.0 in | Wt 366.0 lb

## 2021-06-02 DIAGNOSIS — E669 Obesity, unspecified: Secondary | ICD-10-CM

## 2021-06-02 DIAGNOSIS — Z6841 Body Mass Index (BMI) 40.0 and over, adult: Secondary | ICD-10-CM

## 2021-06-02 DIAGNOSIS — E559 Vitamin D deficiency, unspecified: Secondary | ICD-10-CM | POA: Diagnosis not present

## 2021-06-02 DIAGNOSIS — R7303 Prediabetes: Secondary | ICD-10-CM | POA: Diagnosis not present

## 2021-06-02 DIAGNOSIS — Z7984 Long term (current) use of oral hypoglycemic drugs: Secondary | ICD-10-CM

## 2021-06-02 MED ORDER — METFORMIN HCL 500 MG PO TABS
500.0000 mg | ORAL_TABLET | Freq: Two times a day (BID) | ORAL | 0 refills | Status: DC
  Filled 2021-06-02: qty 60, 30d supply, fill #0

## 2021-06-02 MED ORDER — VITAMIN D (ERGOCALCIFEROL) 1.25 MG (50000 UNIT) PO CAPS
50000.0000 [IU] | ORAL_CAPSULE | ORAL | 0 refills | Status: DC
  Filled 2021-06-02: qty 4, 28d supply, fill #0

## 2021-06-03 ENCOUNTER — Other Ambulatory Visit (HOSPITAL_BASED_OUTPATIENT_CLINIC_OR_DEPARTMENT_OTHER): Payer: Self-pay

## 2021-06-08 NOTE — Progress Notes (Signed)
Chief Complaint:   OBESITY Louis [name] is here to discuss his progress with his obesity treatment plan along with follow-up of his obesity related diagnoses. [name] is on the Category 4 Plan and states he is following his eating plan approximately 85% of the time. [name] states he is walking 1 mile for 20-25 minutes 4 times per week.  Today's visit was #: 79 Starting weight: 406 lbs Starting date: 03/08/2017 Today's weight: 366 lbs Today's date: 06/02/2021 Total lbs lost to date: 40 Total lbs lost since last in-office visit: 0  Interim History: Louis [name] is unable to get Wegovy due to lack of insurance coverage. He has done well with maintaining his weight loss. He notes his weight loss has slowed down which may be due to decreased RMR.   Subjective:   1. Vitamin D deficiency [name]'s Vitamin D level is not yet at goal. He is on Vitamin D prescription.   2. Pre-diabetes Louis [name] was unable to take a GLP-1 due to insurance denial. He has tolerated metformin previously.   Assessment/Plan:   1. Vitamin D deficiency [name] agreed to start prescription Vitamin D 50,000 IU every week with no refills. We will recheck labs in 1-2 months. He will follow-up for routine testing of Vitamin D, at least 2-3 times per year to avoid over-replacement.  - Vitamin D, Ergocalciferol, (DRISDOL) 1.25 MG (50000 UNIT) CAPS capsule; Take 1 capsule (50,000 Units total) by mouth every 7 (seven) days.  Dispense: 4 capsule; Refill: 0  2. Pre-diabetes Louis [name] agreed to start metformin 500 mg BID with no refills. He will continue to work on weight loss, exercise, and decreasing simple carbohydrates to help decrease the risk of diabetes.   - metFORMIN (GLUCOPHAGE) 500 MG tablet; Take 1 tablet (500 mg total) by mouth 2 (two) times daily with a meal.  Dispense: 60 tablet; Refill: 0  3. Obesity, Current BMI 52.5 Louis [name] is currently in the action stage of change. As such, his goal is to continue with weight loss efforts. He has  agreed to the Category 4 Plan.   Fate will plan to fast for his repeat IC at his next visit.  Exercise goals: As is.   Behavioral modification strategies: increasing lean protein intake.  [name] has agreed to follow-up with our clinic in 4 weeks. He was informed of the importance of frequent follow-up visits to maximize his success with intensive lifestyle modifications for his multiple health conditions.   Objective:   Blood pressure 120/78, pulse 69, temperature 97.9 F (36.6 C), height 5\' 10"  (1.778 m), weight (!) 366 lb (166 kg), SpO2 96 %. Body mass index is 52.52 kg/m.  General: Cooperative, alert, well developed, in no acute distress. HEENT: Conjunctivae and lids unremarkable. Cardiovascular: Regular rhythm.  Lungs: Normal work of breathing. Neurologic: No focal deficits.   Lab Results  Component Value Date   CREATININE 1.05 03/31/2021   BUN 12 03/31/2021   NA 143 03/31/2021   K 4.5 03/31/2021   CL 103 03/31/2021   CO2 27 03/31/2021   Lab Results  Component Value Date   ALT 15 03/31/2021   AST 19 03/31/2021   ALKPHOS 86 03/31/2021   BILITOT 0.3 03/31/2021   Lab Results  Component Value Date   HGBA1C 5.6 03/31/2021   HGBA1C 5.5 04/28/2020   HGBA1C 5.3 10/22/2019   HGBA1C 5.4 05/15/2019   HGBA1C 5.5 02/05/2019   Lab Results  Component Value Date   INSULIN 6.3 03/31/2021   INSULIN 4.2 04/28/2020  INSULIN 10.4 10/22/2019   INSULIN 9.1 05/15/2019   INSULIN 6.5 02/05/2019   Lab Results  Component Value Date   TSH 1.260 03/08/2017   Lab Results  Component Value Date   CHOL 154 03/31/2021   HDL 54 03/31/2021   LDLCALC 90 03/31/2021   TRIG 46 03/31/2021   CHOLHDL 3.2 04/28/2020   Lab Results  Component Value Date   VD25OH 28.0 (L) 03/31/2021   VD25OH 37.1 04/28/2020   VD25OH 43.1 10/22/2019   Lab Results  Component Value Date   WBC 7.3 03/31/2021   HGB 14.9 03/31/2021   HCT 45.4 03/31/2021   MCV 88 03/31/2021   PLT 240 03/31/2021   No  results found for: IRON, TIBC, FERRITIN  Attestation Statements:   Reviewed by clinician on day of visit: allergies, medications, problem list, medical history, surgical history, family history, social history, and previous encounter notes.   I, Trixie Dredge, am acting as transcriptionist for Dennard Nip, MD.  I have reviewed the above documentation for accuracy and completeness, and I agree with the above. -  Dennard Nip, MD

## 2021-06-26 ENCOUNTER — Other Ambulatory Visit (INDEPENDENT_AMBULATORY_CARE_PROVIDER_SITE_OTHER): Payer: Self-pay | Admitting: Family Medicine

## 2021-06-26 DIAGNOSIS — R7303 Prediabetes: Secondary | ICD-10-CM

## 2021-06-26 DIAGNOSIS — E559 Vitamin D deficiency, unspecified: Secondary | ICD-10-CM

## 2021-06-27 ENCOUNTER — Other Ambulatory Visit (HOSPITAL_BASED_OUTPATIENT_CLINIC_OR_DEPARTMENT_OTHER): Payer: Self-pay

## 2021-06-28 ENCOUNTER — Other Ambulatory Visit (HOSPITAL_BASED_OUTPATIENT_CLINIC_OR_DEPARTMENT_OTHER): Payer: Self-pay

## 2021-06-29 ENCOUNTER — Other Ambulatory Visit (HOSPITAL_BASED_OUTPATIENT_CLINIC_OR_DEPARTMENT_OTHER): Payer: Self-pay

## 2021-06-30 ENCOUNTER — Other Ambulatory Visit (HOSPITAL_BASED_OUTPATIENT_CLINIC_OR_DEPARTMENT_OTHER): Payer: Self-pay

## 2021-06-30 ENCOUNTER — Ambulatory Visit (INDEPENDENT_AMBULATORY_CARE_PROVIDER_SITE_OTHER): Payer: Managed Care, Other (non HMO) | Admitting: Family Medicine

## 2021-07-01 ENCOUNTER — Other Ambulatory Visit (HOSPITAL_BASED_OUTPATIENT_CLINIC_OR_DEPARTMENT_OTHER): Payer: Self-pay

## 2021-07-07 ENCOUNTER — Other Ambulatory Visit (HOSPITAL_BASED_OUTPATIENT_CLINIC_OR_DEPARTMENT_OTHER): Payer: Self-pay

## 2021-07-08 ENCOUNTER — Other Ambulatory Visit (HOSPITAL_BASED_OUTPATIENT_CLINIC_OR_DEPARTMENT_OTHER): Payer: Self-pay

## 2021-07-11 ENCOUNTER — Other Ambulatory Visit (HOSPITAL_BASED_OUTPATIENT_CLINIC_OR_DEPARTMENT_OTHER): Payer: Self-pay

## 2021-07-12 ENCOUNTER — Other Ambulatory Visit (HOSPITAL_BASED_OUTPATIENT_CLINIC_OR_DEPARTMENT_OTHER): Payer: Self-pay

## 2021-07-13 ENCOUNTER — Other Ambulatory Visit (HOSPITAL_BASED_OUTPATIENT_CLINIC_OR_DEPARTMENT_OTHER): Payer: Self-pay

## 2021-07-14 ENCOUNTER — Other Ambulatory Visit (HOSPITAL_BASED_OUTPATIENT_CLINIC_OR_DEPARTMENT_OTHER): Payer: Self-pay

## 2021-07-20 ENCOUNTER — Ambulatory Visit (INDEPENDENT_AMBULATORY_CARE_PROVIDER_SITE_OTHER): Payer: Managed Care, Other (non HMO) | Admitting: Family Medicine

## 2021-07-20 ENCOUNTER — Other Ambulatory Visit (HOSPITAL_BASED_OUTPATIENT_CLINIC_OR_DEPARTMENT_OTHER): Payer: Self-pay

## 2021-07-20 ENCOUNTER — Encounter (INDEPENDENT_AMBULATORY_CARE_PROVIDER_SITE_OTHER): Payer: Self-pay | Admitting: Family Medicine

## 2021-07-20 VITALS — BP 126/85 | HR 65 | Temp 98.0°F | Ht 70.0 in | Wt 374.0 lb

## 2021-07-20 DIAGNOSIS — R0602 Shortness of breath: Secondary | ICD-10-CM | POA: Diagnosis not present

## 2021-07-20 DIAGNOSIS — G4733 Obstructive sleep apnea (adult) (pediatric): Secondary | ICD-10-CM | POA: Diagnosis not present

## 2021-07-20 DIAGNOSIS — E669 Obesity, unspecified: Secondary | ICD-10-CM | POA: Diagnosis not present

## 2021-07-20 DIAGNOSIS — Z9989 Dependence on other enabling machines and devices: Secondary | ICD-10-CM

## 2021-07-20 DIAGNOSIS — Z6841 Body Mass Index (BMI) 40.0 and over, adult: Secondary | ICD-10-CM

## 2021-07-20 DIAGNOSIS — E559 Vitamin D deficiency, unspecified: Secondary | ICD-10-CM | POA: Diagnosis not present

## 2021-07-20 MED ORDER — VITAMIN D (ERGOCALCIFEROL) 1.25 MG (50000 UNIT) PO CAPS
50000.0000 [IU] | ORAL_CAPSULE | ORAL | 0 refills | Status: DC
  Filled 2021-07-20: qty 4, 28d supply, fill #0

## 2021-07-20 MED ORDER — WEGOVY 0.5 MG/0.5ML ~~LOC~~ SOAJ
0.5000 mg | SUBCUTANEOUS | 0 refills | Status: DC
  Filled 2021-07-20: qty 2, 28d supply, fill #0

## 2021-07-26 NOTE — Progress Notes (Signed)
Chief Complaint:   OBESITY Louis [name] is here to discuss his progress with his obesity treatment plan along with follow-up of his obesity related diagnoses. Louis [name] is on the Category 4 Plan and states he is following his eating plan approximately 90% of the time. Louis [name] states he is walking for 60 minutes 3 times per week.  Today's visit was #: 55 Starting weight: 406 lbs Starting date: 03/08/2017 Today's weight: 374 lbs Today's date: 07/20/2021 Total lbs lost to date: 32 Total lbs lost since last in-office visit: 0  Interim History: Louis [name] continues to struggle with weight loss.  He is working on Facilities manager.  He is trying to follow his eating plan when he can.  He is hoping to restart [facility].  We discussed the options of having weight loss surgery which he is considering.  Subjective:   1. SOB (shortness of breath) on exertion Louis [name] is due to have his ICD rechecked today.  His symptoms are stable.  2. OSA (obstructive sleep apnea) Louis [name] uses a CPAP, but he has been taking it off more frequently.  This has likely been decreasing his RMR and making weight loss difficult.  3. Vitamin D deficiency Louis [name] is stable on vitamin D, and he denies nausea, vomiting, or muscle weakness.  Assessment/Plan:   1. SOB (shortness of breath) on exertion Louis [name]'s RMR has slightly improved, but is still significantly below expected range.  2. OSA (obstructive sleep apnea) Louis [name] will try to use his CPAP machine more regularly.  3. Vitamin D deficiency Louis [name] will continue prescription vitamin D 50,000 units once weekly, and we will refill for 1 month.  - Vitamin D, Ergocalciferol, (DRISDOL) 1.25 MG (50000 UNIT) CAPS capsule; Take 1 capsule (50,000 Units total) by mouth every 7 (seven) days.  Dispense: 4 capsule; Refill: 0  4. Obesity, Current BMI 53.6 Louis [name] is currently in the action stage of change. As such, his goal is to continue with weight loss efforts. He has  agreed to the Category 4 Plan.   We discussed various medication options to help Louis [name] with his weight loss efforts and we both agreed to restart Wegovy at 0.5 mg once weekly, and we will refill for 1 month.  - Semaglutide-Weight Management (WEGOVY) 0.5 MG/0.5ML SOAJ; Inject 0.5 mg into the skin once a week.  Dispense: 2 mL; Refill: 0  Exercise goals: As is.  Behavioral modification strategies: increasing water intake.  Louis [name] has agreed to follow-up with our clinic in 4 weeks. He was informed of the importance of frequent follow-up visits to maximize his success with intensive lifestyle modifications for his multiple health conditions.   Objective:   Blood pressure 126/85, pulse 65, temperature 98 F (36.7 C), height 5\' 10"  (1.778 m), weight (!) 374 lb (169.6 kg), SpO2 98 %. Body mass index is 53.66 kg/m.  General: Cooperative, alert, well developed, in no acute distress. HEENT: Conjunctivae and lids unremarkable. Cardiovascular: Regular rhythm.  Lungs: Normal work of breathing. Neurologic: No focal deficits.   Lab Results  Component Value Date   CREATININE 1.05 03/31/2021   BUN 12 03/31/2021   NA 143 03/31/2021   K 4.5 03/31/2021   CL 103 03/31/2021   CO2 27 03/31/2021   Lab Results  Component Value Date   ALT 15 03/31/2021   AST 19 03/31/2021   ALKPHOS 86 03/31/2021   BILITOT 0.3 03/31/2021   Lab Results  Component Value Date   HGBA1C 5.6 03/31/2021   HGBA1C  5.5 04/28/2020   HGBA1C 5.3 10/22/2019   HGBA1C 5.4 05/15/2019   HGBA1C 5.5 02/05/2019   Lab Results  Component Value Date   INSULIN 6.3 03/31/2021   INSULIN 4.2 04/28/2020   INSULIN 10.4 10/22/2019   INSULIN 9.1 05/15/2019   INSULIN 6.5 02/05/2019   Lab Results  Component Value Date   TSH 1.260 03/08/2017   Lab Results  Component Value Date   CHOL 154 03/31/2021   HDL 54 03/31/2021   LDLCALC 90 03/31/2021   TRIG 46 03/31/2021   CHOLHDL 3.2 04/28/2020   Lab Results  Component Value Date    VD25OH 28.0 (L) 03/31/2021   VD25OH 37.1 04/28/2020   VD25OH 43.1 10/22/2019   Lab Results  Component Value Date   WBC 7.3 03/31/2021   HGB 14.9 03/31/2021   HCT 45.4 03/31/2021   MCV 88 03/31/2021   PLT 240 03/31/2021   No results found for: "IRON", "TIBC", "FERRITIN"  Attestation Statements:   Reviewed by clinician on day of visit: allergies, medications, problem list, medical history, surgical history, family history, social history, and previous encounter notes.   I, Burt Knack, am acting as transcriptionist for Quillian Quince, MD.  I have reviewed the above documentation for accuracy and completeness, and I agree with the above. -  Quillian Quince, MD

## 2021-08-10 ENCOUNTER — Encounter (INDEPENDENT_AMBULATORY_CARE_PROVIDER_SITE_OTHER): Payer: Self-pay

## 2021-08-18 ENCOUNTER — Ambulatory Visit (INDEPENDENT_AMBULATORY_CARE_PROVIDER_SITE_OTHER): Payer: Managed Care, Other (non HMO) | Admitting: Family Medicine

## 2021-08-18 ENCOUNTER — Other Ambulatory Visit (HOSPITAL_BASED_OUTPATIENT_CLINIC_OR_DEPARTMENT_OTHER): Payer: Self-pay

## 2021-08-18 ENCOUNTER — Encounter (INDEPENDENT_AMBULATORY_CARE_PROVIDER_SITE_OTHER): Payer: Self-pay | Admitting: Family Medicine

## 2021-08-18 VITALS — BP 127/86 | HR 91 | Temp 97.7°F | Ht 70.0 in | Wt 372.0 lb

## 2021-08-18 DIAGNOSIS — E66813 Obesity, class 3: Secondary | ICD-10-CM

## 2021-08-18 DIAGNOSIS — R7303 Prediabetes: Secondary | ICD-10-CM | POA: Diagnosis not present

## 2021-08-18 DIAGNOSIS — E669 Obesity, unspecified: Secondary | ICD-10-CM

## 2021-08-18 DIAGNOSIS — Z6841 Body Mass Index (BMI) 40.0 and over, adult: Secondary | ICD-10-CM | POA: Diagnosis not present

## 2021-08-18 DIAGNOSIS — E559 Vitamin D deficiency, unspecified: Secondary | ICD-10-CM

## 2021-08-18 MED ORDER — VITAMIN D (ERGOCALCIFEROL) 1.25 MG (50000 UNIT) PO CAPS
50000.0000 [IU] | ORAL_CAPSULE | ORAL | 0 refills | Status: DC
  Filled 2021-08-18: qty 4, 28d supply, fill #0

## 2021-08-18 MED ORDER — METFORMIN HCL 500 MG PO TABS
500.0000 mg | ORAL_TABLET | Freq: Two times a day (BID) | ORAL | 0 refills | Status: DC
  Filled 2021-08-18: qty 60, 30d supply, fill #0

## 2021-08-29 NOTE — Progress Notes (Unsigned)
Chief Complaint:   OBESITY Louis [name] is here to discuss his progress with his obesity treatment plan along with follow-up of his obesity related diagnoses. Louis [name] is on the Category 4 Plan and states he is following his eating plan approximately 90% of the time. Louis [name] states he is walking for 20 minutes 7 times per week.  Today's visit was #: 56 Starting weight: 406 lbs Starting date: 03/08/2017 Today's weight: 372 lbs Today's date: 08/03/2021 Total lbs lost to date: 34 Total lbs lost since last in-office visit: 2  Interim History: Louis [name] is doing well with weight loss.  He is changing positions and he has been busy.  He notes struggling to eat all of the food on his plan especially while it is so hot.  Subjective:   1. Pre-diabetes Louis [name] is working on his diet and weight loss.  He denies problems with nausea or vomiting.  2. Vitamin D deficiency Louis [name] is stable on vitamin D, and he denies nausea or vomiting.  Assessment/Plan:   1. Pre-diabetes We will refill metformin for 1 month. Louis [name] will continue to work on weight loss, exercise, and decreasing simple carbohydrates to help decrease the risk of diabetes.   - metFORMIN (GLUCOPHAGE) 500 MG tablet; Take 1 tablet (500 mg total) by mouth 2 (two) times daily with a meal.  Dispense: 60 tablet; Refill: 0  2. Vitamin D deficiency We will refill prescription Vitamin D for 1 month. Louis [name] will follow-up for routine testing of Vitamin D, at least 2-3 times per year to avoid over-replacement.  - Vitamin D, Ergocalciferol, (DRISDOL) 1.25 MG (50000 UNIT) CAPS capsule; Take 1 capsule (50,000 Units total) by mouth every 7 (seven) days.  Dispense: 4 capsule; Refill: 0  3. Obesity, Current BMI 53.5 Louis [name] is currently in the action stage of change. As such, his goal is to continue with weight loss efforts. He has agreed to the Category 4 Plan.   We will recheck fasting labs at his next visit.  We discussed various medication options to help  Louis [name] with his weight loss efforts and we both agreed to continue Wegovy at 0.5 mg once weekly, and we will refill for 1 month.  Exercise goals: As is.   Behavioral modification strategies: increasing lean protein intake and increasing water intake.  Louis [name] has agreed to follow-up with our clinic in 4 weeks. He was informed of the importance of frequent follow-up visits to maximize his success with intensive lifestyle modifications for his multiple health conditions.   Objective:   Blood pressure 127/86, pulse 91, temperature 97.7 F (36.5 C), height 5\' 10"  (1.778 m), weight (!) 372 lb (168.7 kg), SpO2 99 %. Body mass index is 53.38 kg/m.  General: Cooperative, alert, well developed, in no acute distress. HEENT: Conjunctivae and lids unremarkable. Cardiovascular: Regular rhythm.  Lungs: Normal work of breathing. Neurologic: No focal deficits.   Lab Results  Component Value Date   CREATININE 1.05 03/31/2021   BUN 12 03/31/2021   NA 143 03/31/2021   K 4.5 03/31/2021   CL 103 03/31/2021   CO2 27 03/31/2021   Lab Results  Component Value Date   ALT 15 03/31/2021   AST 19 03/31/2021   ALKPHOS 86 03/31/2021   BILITOT 0.3 03/31/2021   Lab Results  Component Value Date   HGBA1C 5.6 03/31/2021   HGBA1C 5.5 04/28/2020   HGBA1C 5.3 10/22/2019   HGBA1C 5.4 05/15/2019   HGBA1C 5.5 02/05/2019   Lab Results  Component Value Date  INSULIN 6.3 03/31/2021   INSULIN 4.2 04/28/2020   INSULIN 10.4 10/22/2019   INSULIN 9.1 05/15/2019   INSULIN 6.5 02/05/2019   Lab Results  Component Value Date   TSH 1.260 03/08/2017   Lab Results  Component Value Date   CHOL 154 03/31/2021   HDL 54 03/31/2021   LDLCALC 90 03/31/2021   TRIG 46 03/31/2021   CHOLHDL 3.2 04/28/2020   Lab Results  Component Value Date   VD25OH 28.0 (L) 03/31/2021   VD25OH 37.1 04/28/2020   VD25OH 43.1 10/22/2019   Lab Results  Component Value Date   WBC 7.3 03/31/2021   HGB 14.9 03/31/2021   HCT 45.4  03/31/2021   MCV 88 03/31/2021   PLT 240 03/31/2021   No results found for: "IRON", "TIBC", "FERRITIN"  Attestation Statements:   Reviewed by clinician on day of visit: allergies, medications, problem list, medical history, surgical history, family history, social history, and previous encounter notes.   I, Burt Knack, am acting as transcriptionist for Quillian Quince, MD.  I have reviewed the above documentation for accuracy and completeness, and I agree with the above. -  Quillian Quince, MD

## 2021-08-30 ENCOUNTER — Other Ambulatory Visit (HOSPITAL_BASED_OUTPATIENT_CLINIC_OR_DEPARTMENT_OTHER): Payer: Self-pay

## 2021-08-30 MED ORDER — WEGOVY 0.5 MG/0.5ML ~~LOC~~ SOAJ
0.5000 mg | SUBCUTANEOUS | 0 refills | Status: AC
  Filled 2021-08-30 – 2021-10-27 (×2): qty 2, 28d supply, fill #0

## 2021-09-02 ENCOUNTER — Other Ambulatory Visit (HOSPITAL_BASED_OUTPATIENT_CLINIC_OR_DEPARTMENT_OTHER): Payer: Self-pay

## 2021-09-12 ENCOUNTER — Other Ambulatory Visit (HOSPITAL_BASED_OUTPATIENT_CLINIC_OR_DEPARTMENT_OTHER): Payer: Self-pay

## 2021-09-22 ENCOUNTER — Ambulatory Visit (INDEPENDENT_AMBULATORY_CARE_PROVIDER_SITE_OTHER): Payer: Managed Care, Other (non HMO) | Admitting: Family Medicine

## 2021-09-28 ENCOUNTER — Other Ambulatory Visit (HOSPITAL_BASED_OUTPATIENT_CLINIC_OR_DEPARTMENT_OTHER): Payer: Self-pay

## 2021-09-30 ENCOUNTER — Other Ambulatory Visit (HOSPITAL_BASED_OUTPATIENT_CLINIC_OR_DEPARTMENT_OTHER): Payer: Self-pay

## 2021-10-17 ENCOUNTER — Other Ambulatory Visit (HOSPITAL_BASED_OUTPATIENT_CLINIC_OR_DEPARTMENT_OTHER): Payer: Self-pay

## 2021-10-18 ENCOUNTER — Other Ambulatory Visit (HOSPITAL_BASED_OUTPATIENT_CLINIC_OR_DEPARTMENT_OTHER): Payer: Self-pay

## 2021-10-27 ENCOUNTER — Other Ambulatory Visit (INDEPENDENT_AMBULATORY_CARE_PROVIDER_SITE_OTHER): Payer: Self-pay | Admitting: Family Medicine

## 2021-10-27 ENCOUNTER — Other Ambulatory Visit (INDEPENDENT_AMBULATORY_CARE_PROVIDER_SITE_OTHER): Payer: Self-pay | Admitting: Nurse Practitioner

## 2021-10-27 ENCOUNTER — Other Ambulatory Visit (HOSPITAL_COMMUNITY): Payer: Self-pay

## 2021-10-27 ENCOUNTER — Other Ambulatory Visit (HOSPITAL_BASED_OUTPATIENT_CLINIC_OR_DEPARTMENT_OTHER): Payer: Self-pay

## 2021-10-27 DIAGNOSIS — I10 Essential (primary) hypertension: Secondary | ICD-10-CM

## 2021-10-27 DIAGNOSIS — R7303 Prediabetes: Secondary | ICD-10-CM

## 2021-10-27 DIAGNOSIS — E559 Vitamin D deficiency, unspecified: Secondary | ICD-10-CM

## 2021-10-31 ENCOUNTER — Other Ambulatory Visit (HOSPITAL_BASED_OUTPATIENT_CLINIC_OR_DEPARTMENT_OTHER): Payer: Self-pay

## 2021-11-01 ENCOUNTER — Other Ambulatory Visit (HOSPITAL_BASED_OUTPATIENT_CLINIC_OR_DEPARTMENT_OTHER): Payer: Self-pay

## 2021-11-02 ENCOUNTER — Other Ambulatory Visit (HOSPITAL_BASED_OUTPATIENT_CLINIC_OR_DEPARTMENT_OTHER): Payer: Self-pay

## 2021-11-04 ENCOUNTER — Other Ambulatory Visit (HOSPITAL_BASED_OUTPATIENT_CLINIC_OR_DEPARTMENT_OTHER): Payer: Self-pay

## 2022-08-14 ENCOUNTER — Ambulatory Visit (INDEPENDENT_AMBULATORY_CARE_PROVIDER_SITE_OTHER): Payer: Managed Care, Other (non HMO) | Admitting: Medical

## 2022-08-14 ENCOUNTER — Other Ambulatory Visit (HOSPITAL_BASED_OUTPATIENT_CLINIC_OR_DEPARTMENT_OTHER): Payer: Self-pay

## 2022-08-14 ENCOUNTER — Encounter: Payer: Self-pay | Admitting: Medical

## 2022-08-14 VITALS — BP 135/80 | HR 63 | Ht 72.0 in | Wt 357.8 lb

## 2022-08-14 DIAGNOSIS — H00011 Hordeolum externum right upper eyelid: Secondary | ICD-10-CM

## 2022-08-14 DIAGNOSIS — R739 Hyperglycemia, unspecified: Secondary | ICD-10-CM | POA: Diagnosis not present

## 2022-08-14 DIAGNOSIS — R7303 Prediabetes: Secondary | ICD-10-CM

## 2022-08-14 DIAGNOSIS — Z113 Encounter for screening for infections with a predominantly sexual mode of transmission: Secondary | ICD-10-CM

## 2022-08-14 DIAGNOSIS — Z0000 Encounter for general adult medical examination without abnormal findings: Secondary | ICD-10-CM

## 2022-08-14 DIAGNOSIS — I10 Essential (primary) hypertension: Secondary | ICD-10-CM

## 2022-08-14 DIAGNOSIS — Z125 Encounter for screening for malignant neoplasm of prostate: Secondary | ICD-10-CM | POA: Diagnosis not present

## 2022-08-14 DIAGNOSIS — E559 Vitamin D deficiency, unspecified: Secondary | ICD-10-CM

## 2022-08-14 DIAGNOSIS — Z1211 Encounter for screening for malignant neoplasm of colon: Secondary | ICD-10-CM

## 2022-08-14 MED ORDER — LOSARTAN POTASSIUM 100 MG PO TABS
100.0000 mg | ORAL_TABLET | Freq: Every day | ORAL | 3 refills | Status: AC
  Filled 2022-08-14: qty 90, 90d supply, fill #0

## 2022-08-14 MED ORDER — METFORMIN HCL 500 MG PO TABS
500.0000 mg | ORAL_TABLET | Freq: Two times a day (BID) | ORAL | 3 refills | Status: AC
  Filled 2022-08-14: qty 180, 90d supply, fill #0

## 2022-08-14 MED ORDER — VITAMIN D (ERGOCALCIFEROL) 1.25 MG (50000 UNIT) PO CAPS
50000.0000 [IU] | ORAL_CAPSULE | ORAL | 0 refills | Status: DC
  Filled 2022-08-14: qty 8, 56d supply, fill #0

## 2022-08-14 MED ORDER — TOBRAMYCIN 0.3 % OP SOLN
2.0000 [drp] | Freq: Four times a day (QID) | OPHTHALMIC | 0 refills | Status: AC
  Filled 2022-08-14: qty 5, 13d supply, fill #0

## 2022-08-14 NOTE — Progress Notes (Signed)
Subjective:    Patient ID: Louis [name], male    DOB: September 23, 1975, 47 y.o.   MRN: [id number]  HPI  Pt in for wellness. Only at cereal this morning.  Pt works Youth worker. Exercise limited to on days when not working.. Pt smokes 2 cigarettes intermittent(when he is very stressed) sporadic 4 total in one week. Pt takes his lunch to work.  2 beers a day.  Declines vacccine.   Pt willing to get colnosocopy.  Htn- bp controlled with losartan.  Vit d deficiency- rx refill vit d and level today.  Elevated sugar and diabetes- get A1c and mefromin refill.    Review of Systems  Constitutional:  Negative for chills, fatigue and fever.  Eyes:        On and off intermittent stye to rt upper eye lid and lower lid. Now has small one present. No vision changes.  Respiratory:  Negative for cough, chest tightness, shortness of breath and wheezing.   Cardiovascular:  Negative for chest pain and palpitations.  Gastrointestinal:  Negative for abdominal pain, nausea and vomiting.  Genitourinary:  Negative for dysuria and frequency.  Musculoskeletal:  Negative for back pain and myalgias.  Skin:  Negative for rash.  Neurological:  Negative for dizziness, weakness and headaches.  Hematological:  Negative for adenopathy. Does not bruise/bleed easily.  Psychiatric/Behavioral:  Negative for behavioral problems and dysphoric mood. The patient is not nervous/anxious.     Past Medical History:  Diagnosis Date   Allergy    Arthritis    in both knees   GERD (gastroesophageal reflux disease)    Headache    HTN (hypertension)    Obesity      Social History   Socioeconomic History   Marital status: Divorced    Spouse name: Markal Cedillos   Number of children: 7   Years of education: Not on file   Highest education level: Not on file  Occupational History   Occupation: Corporate investment banker  Tobacco Use   Smoking status: Former    Current packs/day: 0.00    Average packs/day: 0.3 packs/day  for 3.0 years (0.8 ttl pk-yrs)    Types: Cigarettes    Start date: 06/14/2013    Quit date: 06/14/2016    Years since quitting: 6.1   Smokeless tobacco: Never  Vaping Use   Vaping status: Never Used  Substance and Sexual Activity   Alcohol use: Yes    Alcohol/week: 3.0 standard drinks of alcohol    Types: 3 Cans of beer per week    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   Drug use: No   Sexual activity: Yes  Other Topics Concern   Not on file  Social History Narrative   Not on file   Social Determinants of Health   Financial Resource Strain: Not on file  Food Insecurity: Not on file  Transportation Needs: Not on file  Physical Activity: Not on file  Stress: Not on file  Social Connections: Not on file  Intimate Partner Violence: Not on file    Past Surgical History:  Procedure Laterality Date   ACHILLES TENDON REPAIR     ANTERIOR CRUCIATE LIGAMENT REPAIR     LUMBAR LAMINECTOMY/DECOMPRESSION MICRODISCECTOMY Right 09/19/2016   Procedure: Right Lumbar Five-Sacral one Laminotomy/Foraminotomy/removal of Synovial cyst;  Surgeon: [name], MD;  Location: [facility] OR;  Service: Neurosurgery;  Laterality: Right;  Right L5-S1 Laminotomy/Foraminotomy/removal of Synovial cyst   POSTERIOR CERVICAL LAMINECTOMY N/A 06/12/2016   Procedure:  Cervical One Posterior cervical laminectomy;  Surgeon: [name], MD;  Location: [facility] OR;  Service: Neurosurgery;  Laterality: N/A;  posterior   TONSILLECTOMY      Family History  Problem Relation Age of Onset   Diabetes Father    Obesity Father     Allergies  Allergen Reactions   No Known Allergies     Current Outpatient Medications on File Prior to Visit  Medication Sig Dispense Refill   acetaminophen (TYLENOL) 500 MG tablet Take 1,000 mg by mouth every 6 (six) hours as needed (for pain.).     Semaglutide-Weight Management (WEGOVY) 0.5 MG/0.5ML SOAJ Inject 0.5 mg into the skin once a week. (Patient not taking: Reported on 08/14/2022) 2  mL 0   No current facility-administered medications on file prior to visit.    BP 135/80   Pulse 63   Ht 6' (1.829 m)   Wt (!) 357 lb 12.8 oz (162.3 kg)   SpO2 94%   BMI 48.53 kg/m        Objective:   Physical Exam  General Mental Status- Alert. General Appearance- Not in acute distress.   Skin General: Color- Normal Color. Moisture- Normal Moisture.  Neck Carotid Arteries- Normal color. Moisture- Normal Moisture. No carotid bruits. No JVD.  Chest and Lung Exam Auscultation: Breath Sounds:-Normal.  Cardiovascular Auscultation:Rythm- Regular. Murmurs & Other Heart Sounds:Auscultation of the heart reveals- No Murmurs.  Abdomen Inspection:-Inspeection Normal. Palpation/Percussion:Note:No mass. Palpation and Percussion of the abdomen reveal- Non Tender, Non Distended + BS, no rebound or guarding.   Neurologic Cranial Nerve exam:- CN III-XII intact(No nystagmus), symmetric smile. Strength:- 5/5 equal and symmetric strength both upper and lower extremities.   Eyes- rt upper eye lid mid aspect small stye. Conjunctiva clear.    Assessment & Plan:   Patient Instructions  For you wellness exam today I have ordered cbc, cmp, psa lipid panel, rpr and hiv.  Declines flu vaccine.  Recommend exercise and healthy diet.  We will let you know lab results as they come in.  Follow up date appointment will be determined after lab review.     Essential hypertension - losartan (COZAAR) 100 MG tablet; Take 1 tablet (100 mg total) by mouth daily.  Dispense: 90 tablet; Refill: 3  Pre-diabetes - metFORMIN (GLUCOPHAGE) 500 MG tablet; Take 1 tablet (500 mg total) by mouth 2 (two) times daily with a meal.  Dispense: 180 tablet; Refill: 3  Vitamin D deficiency  - Vitamin D, Ergocalciferol, (DRISDOL) 1.25 MG (50000 UNIT) CAPS capsule; Take 1 capsule (50,000 Units total) by mouth every 7 (seven) days.  Dispense: 8 capsule; Refill: 0 - VITAMIN D 25 Hydroxy (Vit-D Deficiency,  Fractures)     Screening for prostate cancer  - PSA  Screen for STD (sexually transmitted disease) - HIV Antibody (routine testing w rflx) - RPR   Elevated blood sugar - Hemoglobin A1c  8. Screening for colon cancer - Ambulatory referral to Gastroenterology (please call their office within the week)  Follow up date to be determined after lab review.   Esperanza Richters, [place]   [id number] charge as did address chronic problems htn, vit d, stye and prediabetes.

## 2022-08-14 NOTE — Patient Instructions (Addendum)
For you wellness exam today I have ordered cbc, cmp, psa lipid panel, rpr and hiv.  Declines flu vaccine.  Recommend exercise and healthy diet.  We will let you know lab results as they come in.  Follow up date appointment will be determined after lab review.     Essential hypertension - losartan (COZAAR) 100 MG tablet; Take 1 tablet (100 mg total) by mouth daily.  Dispense: 90 tablet; Refill: 3  Pre-diabetes - metFORMIN (GLUCOPHAGE) 500 MG tablet; Take 1 tablet (500 mg total) by mouth 2 (two) times daily with a meal.  Dispense: 180 tablet; Refill: 3  Vitamin D deficiency  - Vitamin D, Ergocalciferol, (DRISDOL) 1.25 MG (50000 UNIT) CAPS capsule; Take 1 capsule (50,000 Units total) by mouth every 7 (seven) days.  Dispense: 8 capsule; Refill: 0 - VITAMIN D 25 Hydroxy (Vit-D Deficiency, Fractures)     Screening for prostate cancer  - PSA  Screen for STD (sexually transmitted disease) - HIV Antibody (routine testing w rflx) - RPR   Elevated blood sugar - Hemoglobin A1c  8. Screening for colon cancer - Ambulatory referral to Gastroenterology (please call their office within the week)  For rt eye lid stye warm compresses and go ahead tobrex.  Follow up date to be determined after lab review.

## 2022-08-15 MED ORDER — VITAMIN D (ERGOCALCIFEROL) 1.25 MG (50000 UNIT) PO CAPS
50000.0000 [IU] | ORAL_CAPSULE | ORAL | 0 refills | Status: AC
  Filled 2022-08-15 – 2022-10-04 (×2): qty 8, 56d supply, fill #0

## 2022-08-15 NOTE — Addendum Note (Signed)
Addended by: Gwenevere Abbot on: 08/15/2022 07:42 PM   Modules accepted: Orders

## 2022-08-16 ENCOUNTER — Other Ambulatory Visit (HOSPITAL_BASED_OUTPATIENT_CLINIC_OR_DEPARTMENT_OTHER): Payer: Self-pay

## 2022-10-04 ENCOUNTER — Other Ambulatory Visit: Payer: Self-pay

## 2022-10-04 ENCOUNTER — Encounter (HOSPITAL_BASED_OUTPATIENT_CLINIC_OR_DEPARTMENT_OTHER): Payer: Self-pay

## 2022-10-04 ENCOUNTER — Other Ambulatory Visit (HOSPITAL_BASED_OUTPATIENT_CLINIC_OR_DEPARTMENT_OTHER): Payer: Self-pay

## 2022-10-04 ENCOUNTER — Other Ambulatory Visit (INDEPENDENT_AMBULATORY_CARE_PROVIDER_SITE_OTHER): Payer: Self-pay | Admitting: Family Medicine

## 2022-10-18 ENCOUNTER — Other Ambulatory Visit (HOSPITAL_BASED_OUTPATIENT_CLINIC_OR_DEPARTMENT_OTHER): Payer: Self-pay

## 2023-05-11 ENCOUNTER — Ambulatory Visit (INDEPENDENT_AMBULATORY_CARE_PROVIDER_SITE_OTHER)

## 2023-05-11 ENCOUNTER — Ambulatory Visit (INDEPENDENT_AMBULATORY_CARE_PROVIDER_SITE_OTHER): Admitting: Podiatry

## 2023-05-11 ENCOUNTER — Other Ambulatory Visit (HOSPITAL_BASED_OUTPATIENT_CLINIC_OR_DEPARTMENT_OTHER): Payer: Self-pay

## 2023-05-11 ENCOUNTER — Ambulatory Visit

## 2023-05-11 DIAGNOSIS — M7661 Achilles tendinitis, right leg: Secondary | ICD-10-CM

## 2023-05-11 DIAGNOSIS — M79671 Pain in right foot: Secondary | ICD-10-CM | POA: Diagnosis not present

## 2023-05-11 DIAGNOSIS — M722 Plantar fascial fibromatosis: Secondary | ICD-10-CM

## 2023-05-11 MED ORDER — PREDNISONE 5 MG PO TABS
ORAL_TABLET | ORAL | 0 refills | Status: AC
  Filled 2023-05-11 (×2): qty 42, 12d supply, fill #0

## 2023-05-11 NOTE — Progress Notes (Signed)
 Physical exam:  General appearance: Pleasant, and in no acute distress. AOx3.  High BMI  Vascular: Pedal pulses: DP palpable bilaterally, PT palpable bilaterally.  Moderate edema lower legs bilaterally. Capillary fill time immediate.  Neurological: Light touch intact feet bilaterally.  no clonus or spasticity noted. diminished Achilles tendon reflex right.  Dermatologic:   Skin normal temperature bilaterally.  Skin normal color, tone, and texture bilaterally.   Musculoskeletal: Plantar heel especially at the plantar calcaneal tubercle right.  Tenderness along the distal Achilles tendon especially at the insertion right.  Achilles tendon intact with palpation along its length.  Weakness in plantarflexion right.  Radiographs: Graphs 3 views right foot: Some osteophytic changes plantar calcaneus.  Some osteophytic change in the posterior heel with anchor in place from previous Achilles tendon reconstruction.  Kager's triangle mildly irregular but otherwise intact  Diagnosis: 1.  Plantar fasciitis right foot  2.  Achilles tendinitis right foot. 3.  Pain right foot.  Plan: -New patient office visit for evaluation and management level 3. - Discussed etiology and treatment of plantar fasciitis and Achilles tendinitis.  Done well with the Achilles tendon reconstruction that we performed in 2015.  He is still working in the concrete business.  Discussed conservative versus surgical options. - RICE - Discussed proper shoes - Rx prednisone  5 mg 12-day taper starting with 30 mg on first day   Return 2 weeks follow-up

## 2023-06-01 ENCOUNTER — Encounter: Payer: Self-pay | Admitting: Podiatry

## 2023-06-01 ENCOUNTER — Ambulatory Visit: Admitting: Podiatry

## 2023-06-01 DIAGNOSIS — M7661 Achilles tendinitis, right leg: Secondary | ICD-10-CM

## 2023-06-01 MED ORDER — TRIAMCINOLONE ACETONIDE 10 MG/ML IJ SUSP
10.0000 mg | Freq: Once | INTRAMUSCULAR | Status: AC
  Administered 2023-06-01: 10 mg

## 2023-06-01 NOTE — Addendum Note (Signed)
 Addended by: Reche Canales on: 06/01/2023 02:09 PM   Modules accepted: Orders

## 2023-06-01 NOTE — Progress Notes (Signed)
 Presents today for follow-up Achilles tendinitis on the right.  Overall he is doing better but is still sore was doing better but in the past 2 days it started to flareup again.   Physical exam:  General appearance: Pleasant, and in no acute distress. AOx3.  Vascular: Pedal pulses: DP 2/4, PT 2/4.  Severe edema lower legs bilaterally. Capillary fill time idiot.  Neurological: Light touch intact feet bilaterally.  Normal Achilles reflex bilaterally.  No clonus or spasticity noted.  Negative Tinel's sign tarsal tunnel and sural nerve right.  Dermatologic:   Skin normal temperature bilaterally.  Skin normal color, tone, and texture bilaterally.   Musculoskeletal: Tenderness posterior aspect heel insertion of Achilles tendon.  Tenderness on the distal 3 to 4 cm of the Achilles tendon.  Muscle weakness in plantarflexion.  No defects in Achilles tendon noted.  No tenderness with lateral compression of the calcaneus.  Radiographs: None  Diagnosis: 1.  Achilles tendinitis right  Plan: -Discussed with him continued pain recommend an injection today.  Continue icing several times a day for 20 minutes at a time.  Continue good supportive shoes. -Injected 3cc 2:1 mixture 0.5 cc Marcaine :Kenolog 10mg /61ml at the right heel around the Achilles tendon taking care not to violate the tendon..  -Naproxen 500 mg p.o. twice daily x 1 refills   Return weeks follow-up Achilles tendinitis right

## 2023-06-22 ENCOUNTER — Other Ambulatory Visit (HOSPITAL_BASED_OUTPATIENT_CLINIC_OR_DEPARTMENT_OTHER): Payer: Self-pay

## 2023-06-22 ENCOUNTER — Ambulatory Visit (INDEPENDENT_AMBULATORY_CARE_PROVIDER_SITE_OTHER): Admitting: Podiatry

## 2023-06-22 ENCOUNTER — Encounter: Payer: Self-pay | Admitting: Podiatry

## 2023-06-22 DIAGNOSIS — M7661 Achilles tendinitis, right leg: Secondary | ICD-10-CM | POA: Diagnosis not present

## 2023-06-22 MED ORDER — NAPROXEN 500 MG PO TABS
500.0000 mg | ORAL_TABLET | Freq: Two times a day (BID) | ORAL | 0 refills | Status: AC
  Filled 2023-06-22: qty 60, 30d supply, fill #0

## 2023-06-22 MED ORDER — TRIAMCINOLONE ACETONIDE 10 MG/ML IJ SUSP
10.0000 mg | Freq: Once | INTRAMUSCULAR | Status: AC
  Administered 2023-06-22: 10 mg

## 2023-06-22 NOTE — Progress Notes (Signed)
 Presents for follow-up injection around Achilles tendon sheath right.  Doing somewhat better with still pretty painful.  Has been icing it and trying to use the diclofenac  gel also.  Has been working on been on his feet a lot   Physical exam:  General appearance: Pleasant, and in no acute distress. AOx3.  Vascular: Pedal pulses: DP 2/4 bilaterally, PT 2 to/4 bilaterally.  Moderate edema lower legs bilaterally. Capillary fill time immediate.  Neurological: Light touch intact feet bilaterally.  Normal Achilles reflex bilaterally.    Dermatologic:   Skin normal temperature bilaterally.  Skin normal color, tone, and texture bilaterally.   Musculoskeletal: Tenderness posterior aspect heel and insertion Achilles tendon on the right.    Diagnosis: 1.  Achilles tendinitis right  Plan: -injected 3cc 2:1 mixture 0.5 cc Marcaine :Kenolog 10mg /72ml at sheath at Achilles tendon and insertion of Achilles tendon.  Right. -Continue Voltaren  gel as needed, ice 20 minutes multiple times a day.  Keep using naproxen 500 mg 1 p.o. twice daily - If he continues to have problems we will consider PT  Return 4 weeks follow-up injection right

## 2023-07-20 ENCOUNTER — Ambulatory Visit (INDEPENDENT_AMBULATORY_CARE_PROVIDER_SITE_OTHER): Admitting: Podiatry

## 2023-07-20 ENCOUNTER — Encounter: Payer: Self-pay | Admitting: Podiatry

## 2023-07-20 ENCOUNTER — Other Ambulatory Visit (HOSPITAL_BASED_OUTPATIENT_CLINIC_OR_DEPARTMENT_OTHER): Payer: Self-pay

## 2023-07-20 DIAGNOSIS — M722 Plantar fascial fibromatosis: Secondary | ICD-10-CM | POA: Diagnosis not present

## 2023-07-20 MED ORDER — PREDNISONE 5 MG PO TABS
ORAL_TABLET | ORAL | 0 refills | Status: AC
  Filled 2023-07-20: qty 42, 12d supply, fill #0

## 2023-07-20 NOTE — Progress Notes (Signed)
 Patient presents with pain around the heel on the right.  Achilles tendinitis is doing much better says probably 9-95% better complains today of pain on the plantar aspect of the heel on the right.   Physical exam:  General appearance: Pleasant, and in no acute distress. AOx3.  Vascular: Pedal pulses: DP 2/4 bilaterally, PT 2 to/4 bilaterally.  Moderate edema lower legs bilaterally. Capillary fill time immediate.  Neurological: Light touch intact feet bilaterally.  Normal Achilles reflex bilaterally.  Negative Tinel's sign tarsal tunnel and porta pedis right.  Dermatologic:   Skin normal temperature bilaterally.  Skin normal color, tone, and texture bilaterally.   Musculoskeletal: Tenderness on the plantar medial aspect of the heel at the plantar calcaneal tubercle right.  No tenderness with lateral compression of the calcaneus.  Just very slight soreness along the Achilles tendon right.    Diagnosis: 1. plantar fasciitis right  Plan: -Established office visit for evaluation and management.  Level 3. - Patient declined injection today for the plantar fascia.  Will instead get him a prescription for prednisone  continue stretching doing the exercises he has been doing.  This seems to be helping quite a bit. - Rx prednisone  5 mg, 30 mg p.o. daily first day and then decrease by 5 mg every other day till done  Return as needed

## 2023-07-20 NOTE — Addendum Note (Signed)
 Addended by: GERRIT ANDREZ CROME on: 07/20/2023 02:21 PM   Modules accepted: Orders

## 2024-02-08 ENCOUNTER — Ambulatory Visit: Admitting: Medical

## 2024-02-08 ENCOUNTER — Other Ambulatory Visit (HOSPITAL_BASED_OUTPATIENT_CLINIC_OR_DEPARTMENT_OTHER): Payer: Self-pay

## 2024-02-08 ENCOUNTER — Other Ambulatory Visit: Payer: Self-pay | Admitting: Medical

## 2024-02-08 ENCOUNTER — Encounter: Payer: Self-pay | Admitting: Medical

## 2024-02-08 VITALS — BP 140/80 | HR 68 | Temp 98.0°F | Resp 14 | Ht 72.0 in | Wt >= 6400 oz

## 2024-02-08 DIAGNOSIS — Z1211 Encounter for screening for malignant neoplasm of colon: Secondary | ICD-10-CM

## 2024-02-08 DIAGNOSIS — Z0000 Encounter for general adult medical examination without abnormal findings: Secondary | ICD-10-CM

## 2024-02-08 DIAGNOSIS — R739 Hyperglycemia, unspecified: Secondary | ICD-10-CM

## 2024-02-08 DIAGNOSIS — Z0184 Encounter for antibody response examination: Secondary | ICD-10-CM

## 2024-02-08 DIAGNOSIS — Z1159 Encounter for screening for other viral diseases: Secondary | ICD-10-CM

## 2024-02-08 DIAGNOSIS — Z125 Encounter for screening for malignant neoplasm of prostate: Secondary | ICD-10-CM

## 2024-02-08 DIAGNOSIS — E559 Vitamin D deficiency, unspecified: Secondary | ICD-10-CM

## 2024-02-08 MED ORDER — LOSARTAN POTASSIUM 50 MG PO TABS
50.0000 mg | ORAL_TABLET | Freq: Every day | ORAL | 3 refills | Status: AC
  Filled 2024-02-08: qty 90, 90d supply, fill #0

## 2024-02-08 NOTE — Patient Instructions (Addendum)
 For you wellness exam today I have ordered cbc, cmp, psa, A1c, hep c antibody, hep b surface antibody and lipid panel(future fasting lab next week)  Flu vaccine declined.  Placing referral GI MD for colonoscopy.  Recommend exercise and healthy diet.  We will let you know lab results as they come in.  Follow up date appointment will be determined after lab review.       Morbidly obese (HCC)(no hx of pancreatitis or thyroid  cancer himself or family) -after labs will try to rx wegovy    Elevated blood sugar -if A1c in diabetic range rx ozempic  rather than wegovy  - Hemoglobin A1c; Future -also consider metformin   Screening for colon cancer - Ambulatory referral to Gastroenterology   Vitamin D  deficiency - VITAMIN D  25 Hydroxy (Vit-D Deficiency, Fractures); Future   Htn(bp better on recheck) Not on meds recently for month so can restart at lower dose/ 50 mg dose. -losartan  50 mg daily dose.     Preventive Care 50-3 Years Old, Male Preventive care refers to lifestyle choices and visits with your health care provider that can promote health and wellness. Preventive care visits are also called wellness exams. What can I expect for my preventive care visit? Counseling During your preventive care visit, your health care provider may ask about your: Medical history, including: Past medical problems. Family medical history. Current health, including: Emotional well-being. Home life and relationship well-being. Sexual activity. Lifestyle, including: Alcohol, nicotine or tobacco, and drug use. Access to firearms. Diet, exercise, and sleep habits. Safety issues such as seatbelt and bike helmet use. Sunscreen use. Work and work astronomer. Physical exam Your health care provider will check your: Height and weight. These may be used to calculate your BMI (body mass index). BMI is a measurement that tells if you are at a healthy weight. Waist circumference. This measures the  distance around your waistline. This measurement also tells if you are at a healthy weight and may help predict your risk of certain diseases, such as type 2 diabetes and high blood pressure. Heart rate and blood pressure. Body temperature. Skin for abnormal spots. What immunizations do I need?  Vaccines are usually given at various ages, according to a schedule. Your health care provider will recommend vaccines for you based on your age, medical history, and lifestyle or other factors, such as travel or where you work. What tests do I need? Screening Your health care provider may recommend screening tests for certain conditions. This may include: Lipid and cholesterol levels. Diabetes screening. This is done by checking your blood sugar (glucose) after you have not eaten for a while (fasting). Hepatitis B test. Hepatitis C test. HIV (human immunodeficiency virus) test. STI (sexually transmitted infection) testing, if you are at risk. Lung cancer screening. Prostate cancer screening. Colorectal cancer screening. Talk with your health care provider about your test results, treatment options, and if necessary, the need for more tests. Follow these instructions at home: Eating and drinking  Eat a diet that includes fresh fruits and vegetables, whole grains, lean protein, and low-fat dairy products. Take vitamin and mineral supplements as recommended by your health care provider. Do not drink alcohol if your health care provider tells you not to drink. If you drink alcohol: Limit how much you have to 0-2 drinks a day. Know how much alcohol is in your drink. In the [place] one drink equals one 12 oz bottle of beer (355 mL), one 5 oz glass of wine (148 mL), or one 1 oz  glass of hard liquor (44 mL). Lifestyle Brush your teeth every morning and night with fluoride toothpaste. Floss one time each day. Exercise for at least 30 minutes 5 or more days each week. Do not use any products that  contain nicotine or tobacco. These products include cigarettes, chewing tobacco, and vaping devices, such as e-cigarettes. If you need help quitting, ask your health care provider. Do not use drugs. If you are sexually active, practice safe sex. Use a condom or other form of protection to prevent STIs. Take aspirin only as told by your health care provider. Make sure that you understand how much to take and what form to take. Work with your health care provider to find out whether it is safe and beneficial for you to take aspirin daily. Find healthy ways to manage stress, such as: Meditation, yoga, or listening to music. Journaling. Talking to a trusted person. Spending time with friends and family. Minimize exposure to UV radiation to reduce your risk of skin cancer. Safety Always wear your seat belt while driving or riding in a vehicle. Do not drive: If you have been drinking alcohol. Do not ride with someone who has been drinking. When you are tired or distracted. While texting. If you have been using any mind-altering substances or drugs. Wear a helmet and other protective equipment during sports activities. If you have firearms in your house, make sure you follow all gun safety procedures. What's next? Go to your health care provider once a year for an annual wellness visit. Ask your health care provider how often you should have your eyes and teeth checked. Stay up to date on all vaccines. This information is not intended to replace advice given to you by your health care provider. Make sure you discuss any questions you have with your health care provider. Document Revised: 06/16/2020 Document Reviewed: 06/16/2020 Elsevier Patient Education  2024 [employer].

## 2024-02-08 NOTE — Progress Notes (Signed)
 "  Subjective:    Patient ID: Louis [name], male    DOB: 03-24-1975, 49 y.o.   MRN: [id number]  HPI    Pt in for wellness. Not fasting.   Pt works holiday representative. Exercise limited to on days when not working.. Pt smokes 2 cigarettes intermittent(when he is very stressed) sporadic 4 total in one week. Pt takes his lunch to work.  2 -3 beers every other day.  Pt dad passed away in the summer. Pt has cut back on above former smoking. Always light smoker in the past.   Declines vacccine.    Pt willing to get colnosocopy.   Htn- bp controlled with losartan .   Vit d deficiency- rx refill vit d and level today.   Elevated sugar and obese. Pt wants to be on wegovy   Htn- on losartan  100 mg daily.  Vit d deficiency history.       Review of Systems  Constitutional:  Negative for chills, fatigue and fever.  HENT:  Negative for congestion, ear discharge, ear pain, postnasal drip, sinus pressure and sinus pain.   Respiratory:  Negative for cough, chest tightness, shortness of breath and wheezing.   Cardiovascular:  Negative for chest pain and palpitations.  Gastrointestinal:  Negative for abdominal pain, constipation and nausea.  Genitourinary:  Negative for dysuria, frequency, testicular pain and urgency.  Musculoskeletal:  Negative for back pain, myalgias and neck stiffness.  Skin:  Negative for rash.  Neurological:  Negative for dizziness, seizures and light-headedness.  Hematological:  Negative for adenopathy.  Psychiatric/Behavioral:  Negative for behavioral problems and confusion.     Past Medical History:  Diagnosis Date   Allergy    Arthritis    in both knees   GERD (gastroesophageal reflux disease)    Headache    HTN (hypertension)    Obesity      Social History   Socioeconomic History   Marital status: Divorced    Spouse name: Lejend Dalby   Number of children: 7   Years of education: Not on file   Highest education level: Not on file  Occupational  History   Occupation: Corporate investment banker  Tobacco Use   Smoking status: Former    Current packs/day: 0.00    Average packs/day: 0.3 packs/day for 3.0 years (0.8 ttl pk-yrs)    Types: Cigarettes    Start date: 06/14/2013    Quit date: 06/14/2016    Years since quitting: 7.6   Smokeless tobacco: Never  Vaping Use   Vaping status: Never Used  Substance and Sexual Activity   Alcohol use: Yes    Alcohol/week: 3.0 standard drinks of alcohol    Types: 3 Cans of beer per week    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   Drug use: No   Sexual activity: Yes  Other Topics Concern   Not on file  Social History Narrative   Not on file   Social Drivers of Health   Tobacco Use: Medium Risk (02/08/2024)   Patient History    Smoking Tobacco Use: Former    Smokeless Tobacco Use: Never    Passive Exposure: Not on file  Financial Resource Strain: Not on file  Food Insecurity: Not on file  Transportation Needs: Not on file  Physical Activity: Not on file  Stress: Not on file  Social Connections: Not on file  Intimate Partner Violence: Not on file  Depression (PHQ2-9): Medium Risk (02/08/2024)   Depression (PHQ2-9)    PHQ-2 Score:  5  Alcohol Screen: Not on file  Housing: Not on file  Utilities: Not on file  Health Literacy: Not on file    Past Surgical History:  Procedure Laterality Date   ACHILLES TENDON REPAIR     ANTERIOR CRUCIATE LIGAMENT REPAIR     LUMBAR LAMINECTOMY/DECOMPRESSION MICRODISCECTOMY Right 09/19/2016   Procedure: Right Lumbar Five-Sacral one Laminotomy/Foraminotomy/removal of Synovial cyst;  Surgeon: Colon Shove, MD;  Location: [facility] OR;  Service: Neurosurgery;  Laterality: Right;  Right L5-S1 Laminotomy/Foraminotomy/removal of Synovial cyst   POSTERIOR CERVICAL LAMINECTOMY N/A 06/12/2016   Procedure: Cervical One Posterior cervical laminectomy;  Surgeon: Colon Shove, MD;  Location: [facility] OR;  Service: Neurosurgery;  Laterality: N/A;  posterior   TONSILLECTOMY       Family History  Problem Relation Age of Onset   Diabetes Father    Obesity Father     Allergies[1]  Medications Ordered Prior to Encounter[2]  BP (!) 140/80   Pulse 68   Temp 98 F (36.7 C) (Oral)   Resp 14   Ht 6' (1.829 m)   Wt (!) 415 lb 6.4 oz (188.4 kg)   SpO2 98%   BMI 56.34 kg/m        Objective:   Physical Exam  General Mental Status- Alert. General Appearance- Not in acute distress.   Skin General: Color- Normal Color. Moisture- Normal Moisture.  Neck No JVD.  Chest and Lung Exam Auscultation: Breath Sounds:-CTA  Cardiovascular Auscultation:Rythm- RRR Murmurs & Other Heart Sounds:Auscultation of the heart reveals- No Murmurs.  Abdomen Inspection:-Inspeection Normal. Palpation/Percussion:Note:No mass. Palpation and Percussion of the abdomen reveal- Non Tender, Non Distended + BS, no rebound or guarding.   Neurologic Cranial Nerve exam:- CN III-XII intact(No nystagmus), symmetric smile. Strength:- 5/5 equal and symmetric strength both upper and lower extremities.       Assessment & Plan:  For you wellness exam today I have ordered cbc, cmp, psa, A1c, hep c antibody, hep b surface antibody and lipid panel(future fasting lab next week)  Flu vaccine declined.  Placing referral GI MD for colonoscopy.  Recommend exercise and healthy diet.  We will let you know lab results as they come in.  Follow up date appointment will be determined after lab review.       Morbidly obese (HCC)(no hx of pancreatitis or thyroid  cancer himself or family) -after labs will try to rx wegovy    Elevated blood sugar -if A1c in diabetic range rx ozempic  rather than wegovy  - Hemoglobin A1c; Future -also consider metformin   Screening for colon cancer - Ambulatory referral to Gastroenterology   Vitamin D  deficiency - VITAMIN D  25 Hydroxy (Vit-D Deficiency, Fractures); Future   Htn(bp better on recheck) Not on meds recently for month so can restart at  lower dose/ 50 mg dose. -losartan  50 mg daily dose.     Dallas Maxwell, PA-C   [id number] charge as did also address htn, obesity, elevated sugar and vit d deficiency.    [1]  Allergies Allergen Reactions   No Known Allergies   [2]  Current Outpatient Medications on File Prior to Visit  Medication Sig Dispense Refill   acetaminophen  (TYLENOL ) 500 MG tablet Take 1,000 mg by mouth every 6 (six) hours as needed (for pain.).     Semaglutide -Weight Management (WEGOVY ) 0.5 MG/0.5ML SOAJ Inject 0.5 mg into the skin once a week. 2 mL 0   tobramycin  (TOBREX ) 0.3 % ophthalmic solution Place 2 drops into the right eye every 6 (six) hours. 5 mL 0  losartan  (COZAAR ) 100 MG tablet Take 1 tablet (100 mg total) by mouth daily. (Patient not taking: Reported on 02/08/2024) 90 tablet 3   metFORMIN  (GLUCOPHAGE ) 500 MG tablet Take 1 tablet (500 mg total) by mouth 2 (two) times daily with a meal. (Patient not taking: Reported on 02/08/2024) 180 tablet 3   Vitamin D , Ergocalciferol , (DRISDOL ) 1.25 MG (50000 UNIT) CAPS capsule Take 1 capsule (50,000 Units total) by mouth every 7 (seven) days. (Patient not taking: Reported on 02/08/2024) 8 capsule 0   No current facility-administered medications on file prior to visit.   "

## 2024-02-15 ENCOUNTER — Other Ambulatory Visit

## 2025-02-13 ENCOUNTER — Encounter: Admitting: Medical
